# Patient Record
Sex: Male | Born: 1970 | Race: Black or African American | Hispanic: No | Marital: Married | Smoking: Never smoker
Health system: Southern US, Community
[De-identification: ages and names within clinical notes are randomized; demographics above are authoritative.]

## PROBLEM LIST (undated history)

## (undated) DIAGNOSIS — B009 Herpesviral infection, unspecified: Secondary | ICD-10-CM

## (undated) DIAGNOSIS — I10 Essential (primary) hypertension: Secondary | ICD-10-CM

## (undated) DIAGNOSIS — J349 Unspecified disorder of nose and nasal sinuses: Secondary | ICD-10-CM

---

## 1999-01-17 ENCOUNTER — Encounter: Payer: Self-pay | Admitting: Internal Medicine

## 1999-01-17 ENCOUNTER — Ambulatory Visit (HOSPITAL_COMMUNITY): Admission: RE | Admit: 1999-01-17 | Discharge: 1999-01-17 | Payer: Self-pay | Admitting: Internal Medicine

## 2001-04-24 ENCOUNTER — Encounter: Payer: Self-pay | Admitting: Family Medicine

## 2001-04-24 ENCOUNTER — Ambulatory Visit (HOSPITAL_COMMUNITY): Admission: RE | Admit: 2001-04-24 | Discharge: 2001-04-24 | Payer: Self-pay | Admitting: Family Medicine

## 2001-10-25 ENCOUNTER — Emergency Department (HOSPITAL_COMMUNITY): Admission: EM | Admit: 2001-10-25 | Discharge: 2001-10-25 | Payer: Self-pay | Admitting: Emergency Medicine

## 2003-11-24 ENCOUNTER — Ambulatory Visit: Payer: Self-pay | Admitting: *Deleted

## 2003-11-25 ENCOUNTER — Ambulatory Visit: Payer: Self-pay | Admitting: Nurse Practitioner

## 2004-02-02 ENCOUNTER — Ambulatory Visit: Payer: Self-pay | Admitting: Nurse Practitioner

## 2004-02-21 ENCOUNTER — Ambulatory Visit: Payer: Self-pay | Admitting: Nurse Practitioner

## 2004-03-27 ENCOUNTER — Ambulatory Visit: Payer: Self-pay | Admitting: Nurse Practitioner

## 2004-04-10 ENCOUNTER — Ambulatory Visit: Payer: Self-pay | Admitting: *Deleted

## 2004-04-10 ENCOUNTER — Ambulatory Visit: Payer: Self-pay | Admitting: Nurse Practitioner

## 2004-05-03 ENCOUNTER — Ambulatory Visit: Payer: Self-pay | Admitting: Nurse Practitioner

## 2004-06-19 ENCOUNTER — Ambulatory Visit: Payer: Self-pay | Admitting: Nurse Practitioner

## 2004-08-28 ENCOUNTER — Ambulatory Visit: Payer: Self-pay | Admitting: Nurse Practitioner

## 2005-10-16 ENCOUNTER — Emergency Department (HOSPITAL_COMMUNITY): Admission: EM | Admit: 2005-10-16 | Discharge: 2005-10-16 | Payer: Self-pay | Admitting: Emergency Medicine

## 2006-04-29 ENCOUNTER — Emergency Department (HOSPITAL_COMMUNITY): Admission: EM | Admit: 2006-04-29 | Discharge: 2006-04-29 | Payer: Self-pay | Admitting: Family Medicine

## 2006-08-14 ENCOUNTER — Ambulatory Visit: Payer: Self-pay | Admitting: Internal Medicine

## 2006-09-25 ENCOUNTER — Ambulatory Visit: Payer: Self-pay | Admitting: Internal Medicine

## 2006-09-26 ENCOUNTER — Encounter (INDEPENDENT_AMBULATORY_CARE_PROVIDER_SITE_OTHER): Payer: Self-pay | Admitting: Nurse Practitioner

## 2006-12-04 ENCOUNTER — Encounter (INDEPENDENT_AMBULATORY_CARE_PROVIDER_SITE_OTHER): Payer: Self-pay | Admitting: *Deleted

## 2006-12-09 ENCOUNTER — Ambulatory Visit: Payer: Self-pay | Admitting: Internal Medicine

## 2007-01-21 ENCOUNTER — Encounter (INDEPENDENT_AMBULATORY_CARE_PROVIDER_SITE_OTHER): Payer: Self-pay | Admitting: Nurse Practitioner

## 2007-01-21 DIAGNOSIS — K2970 Gastritis, unspecified, without bleeding: Secondary | ICD-10-CM | POA: Insufficient documentation

## 2007-01-21 DIAGNOSIS — I10 Essential (primary) hypertension: Secondary | ICD-10-CM | POA: Insufficient documentation

## 2007-01-21 DIAGNOSIS — M129 Arthropathy, unspecified: Secondary | ICD-10-CM | POA: Insufficient documentation

## 2007-01-21 DIAGNOSIS — K2990 Gastroduodenitis, unspecified, without bleeding: Secondary | ICD-10-CM

## 2007-04-02 ENCOUNTER — Encounter (INDEPENDENT_AMBULATORY_CARE_PROVIDER_SITE_OTHER): Payer: Self-pay | Admitting: Nurse Practitioner

## 2007-04-02 ENCOUNTER — Ambulatory Visit: Payer: Self-pay | Admitting: Family Medicine

## 2007-04-02 LAB — CONVERTED CEMR LAB
Albumin: 4 g/dL (ref 3.5–5.2)
BUN: 11 mg/dL (ref 6–23)
Calcium: 9 mg/dL (ref 8.4–10.5)
Chloride: 107 meq/L (ref 96–112)
Cholesterol: 182 mg/dL (ref 0–200)
Glucose, Bld: 98 mg/dL (ref 70–99)
HDL: 46 mg/dL (ref >39)
Hemoglobin: 14.5 g/dL (ref 13.0–17.0)
Lymphs Abs: 1.6 10*3/uL (ref 0.7–4.0)
MCV: 88.5 fL (ref 78.0–100.0)
Monocytes Absolute: 0.4 10*3/uL (ref 0.1–1.0)
Monocytes Relative: 11 % (ref 3–12)
Neutro Abs: 1.4 10*3/uL — ABNORMAL LOW (ref 1.7–7.7)
Neutrophils Relative %: 40 % — ABNORMAL LOW (ref 43–77)
Potassium: 3.7 meq/L (ref 3.5–5.3)
RBC: 4.96 M/uL (ref 4.22–5.81)
Triglycerides: 118 mg/dL (ref <150)
WBC: 3.5 10*3/uL — ABNORMAL LOW (ref 4.0–10.5)

## 2007-05-16 ENCOUNTER — Ambulatory Visit: Payer: Self-pay | Admitting: Internal Medicine

## 2007-06-04 ENCOUNTER — Emergency Department (HOSPITAL_COMMUNITY): Admission: EM | Admit: 2007-06-04 | Discharge: 2007-06-04 | Payer: Self-pay | Admitting: Emergency Medicine

## 2007-06-10 ENCOUNTER — Ambulatory Visit: Payer: Self-pay | Admitting: Internal Medicine

## 2007-08-06 ENCOUNTER — Encounter (INDEPENDENT_AMBULATORY_CARE_PROVIDER_SITE_OTHER): Payer: Self-pay | Admitting: Nurse Practitioner

## 2007-08-06 ENCOUNTER — Ambulatory Visit: Payer: Self-pay | Admitting: Internal Medicine

## 2007-08-06 LAB — CONVERTED CEMR LAB
Basophils Relative: 1 % (ref 0–1)
Eosinophils Absolute: 0.1 10*3/uL (ref 0.0–0.7)
HCT: 45.8 % (ref 39.0–52.0)
Hemoglobin: 15.2 g/dL (ref 13.0–17.0)
MCHC: 33.2 g/dL (ref 30.0–36.0)
MCV: 88.6 fL (ref 78.0–100.0)
Monocytes Absolute: 0.5 10*3/uL (ref 0.1–1.0)
Monocytes Relative: 13 % — ABNORMAL HIGH (ref 3–12)
Neutro Abs: 1.7 10*3/uL (ref 1.7–7.7)
RBC: 5.17 M/uL (ref 4.22–5.81)

## 2007-12-01 ENCOUNTER — Encounter (INDEPENDENT_AMBULATORY_CARE_PROVIDER_SITE_OTHER): Payer: Self-pay | Admitting: Family Medicine

## 2007-12-01 ENCOUNTER — Ambulatory Visit: Payer: Self-pay | Admitting: Internal Medicine

## 2007-12-01 LAB — CONVERTED CEMR LAB
Albumin: 4.6 g/dL (ref 3.5–5.2)
Alkaline Phosphatase: 74 units/L (ref 39–117)
BUN: 13 mg/dL (ref 6–23)
Glucose, Bld: 117 mg/dL — ABNORMAL HIGH (ref 70–99)
Microalb, Ur: 0.76 mg/dL (ref 0.00–1.89)
Potassium: 3.6 meq/L (ref 3.5–5.3)

## 2007-12-09 ENCOUNTER — Ambulatory Visit: Payer: Self-pay | Admitting: Internal Medicine

## 2008-01-13 ENCOUNTER — Ambulatory Visit: Payer: Self-pay | Admitting: Internal Medicine

## 2008-05-12 ENCOUNTER — Encounter (INDEPENDENT_AMBULATORY_CARE_PROVIDER_SITE_OTHER): Payer: Self-pay | Admitting: Internal Medicine

## 2008-05-12 ENCOUNTER — Ambulatory Visit: Payer: Self-pay | Admitting: Internal Medicine

## 2008-05-12 LAB — CONVERTED CEMR LAB
Eosinophils Relative: 2 % (ref 0–5)
HCT: 42.2 % (ref 39.0–52.0)
Lymphocytes Relative: 51 % — ABNORMAL HIGH (ref 12–46)
Lymphs Abs: 1.8 10*3/uL (ref 0.7–4.0)
Neutro Abs: 1.3 10*3/uL — ABNORMAL LOW (ref 1.7–7.7)
Neutrophils Relative %: 36 % — ABNORMAL LOW (ref 43–77)
Platelets: 207 10*3/uL (ref 150–400)
WBC: 3.5 10*3/uL — ABNORMAL LOW (ref 4.0–10.5)

## 2008-05-20 ENCOUNTER — Ambulatory Visit: Payer: Self-pay | Admitting: Family Medicine

## 2008-05-20 ENCOUNTER — Inpatient Hospital Stay (HOSPITAL_COMMUNITY): Admission: EM | Admit: 2008-05-20 | Discharge: 2008-05-21 | Payer: Self-pay | Admitting: Emergency Medicine

## 2008-05-31 ENCOUNTER — Ambulatory Visit: Payer: Self-pay | Admitting: Internal Medicine

## 2008-06-02 ENCOUNTER — Ambulatory Visit: Payer: Self-pay | Admitting: Internal Medicine

## 2008-06-17 ENCOUNTER — Encounter: Payer: Self-pay | Admitting: Internal Medicine

## 2008-06-17 ENCOUNTER — Encounter (INDEPENDENT_AMBULATORY_CARE_PROVIDER_SITE_OTHER): Payer: Self-pay | Admitting: Internal Medicine

## 2008-06-17 ENCOUNTER — Ambulatory Visit: Payer: Self-pay | Admitting: Internal Medicine

## 2008-06-17 LAB — CONVERTED CEMR LAB: Helicobacter Pylori Antibody-IgG: 1.3 — ABNORMAL HIGH

## 2008-06-22 LAB — CBC WITH DIFFERENTIAL/PLATELET
BASO%: 0.5 % (ref 0.0–2.0)
Basophils Absolute: 0 10*3/uL (ref 0.0–0.1)
EOS%: 3.5 % (ref 0.0–7.0)
Eosinophils Absolute: 0.1 10*3/uL (ref 0.0–0.5)
HCT: 43.9 % (ref 38.4–49.9)
HGB: 15 g/dL (ref 13.0–17.1)
LYMPH%: 38.6 % (ref 14.0–49.0)
MCH: 30.1 pg (ref 27.2–33.4)
MCHC: 34.1 g/dL (ref 32.0–36.0)
MCV: 88.1 fL (ref 79.3–98.0)
MONO#: 0.6 10*3/uL (ref 0.1–0.9)
MONO%: 15.3 % — ABNORMAL HIGH (ref 0.0–14.0)
NEUT#: 1.6 10*3/uL (ref 1.5–6.5)
NEUT%: 42.1 % (ref 39.0–75.0)
Platelets: 194 10*3/uL (ref 140–400)
RBC: 4.98 10*6/uL (ref 4.20–5.82)
RDW: 12.7 % (ref 11.0–14.6)
WBC: 3.8 10*3/uL — ABNORMAL LOW (ref 4.0–10.3)
lymph#: 1.5 10*3/uL (ref 0.9–3.3)

## 2008-06-22 LAB — COMPREHENSIVE METABOLIC PANEL
ALT: 24 U/L (ref 0–53)
BUN: 14 mg/dL (ref 6–23)
CO2: 31 mEq/L (ref 19–32)
Calcium: 9.4 mg/dL (ref 8.4–10.5)
Chloride: 102 mEq/L (ref 96–112)
Creatinine, Ser: 1.08 mg/dL (ref 0.40–1.50)
Total Bilirubin: 1 mg/dL (ref 0.3–1.2)

## 2008-06-22 LAB — LACTATE DEHYDROGENASE: LDH: 150 U/L (ref 94–250)

## 2008-06-26 ENCOUNTER — Emergency Department (HOSPITAL_COMMUNITY): Admission: EM | Admit: 2008-06-26 | Discharge: 2008-06-26 | Payer: Self-pay | Admitting: Emergency Medicine

## 2008-07-12 ENCOUNTER — Ambulatory Visit: Payer: Self-pay | Admitting: Internal Medicine

## 2008-08-05 ENCOUNTER — Encounter: Payer: Self-pay | Admitting: Internal Medicine

## 2008-08-05 ENCOUNTER — Ambulatory Visit: Payer: Self-pay | Admitting: Family Medicine

## 2008-08-05 DIAGNOSIS — L270 Generalized skin eruption due to drugs and medicaments taken internally: Secondary | ICD-10-CM | POA: Insufficient documentation

## 2008-10-19 ENCOUNTER — Ambulatory Visit: Payer: Self-pay | Admitting: Internal Medicine

## 2008-10-19 ENCOUNTER — Emergency Department (HOSPITAL_COMMUNITY): Admission: EM | Admit: 2008-10-19 | Discharge: 2008-10-19 | Payer: Self-pay | Admitting: Emergency Medicine

## 2008-10-19 ENCOUNTER — Telehealth (INDEPENDENT_AMBULATORY_CARE_PROVIDER_SITE_OTHER): Payer: Self-pay | Admitting: *Deleted

## 2008-11-17 ENCOUNTER — Ambulatory Visit: Payer: Self-pay | Admitting: Internal Medicine

## 2008-11-17 ENCOUNTER — Encounter (INDEPENDENT_AMBULATORY_CARE_PROVIDER_SITE_OTHER): Payer: Self-pay | Admitting: Internal Medicine

## 2008-11-17 LAB — CONVERTED CEMR LAB
CO2: 28 meq/L (ref 19–32)
Calcium: 9.4 mg/dL (ref 8.4–10.5)
Chlamydia, Swab/Urine, PCR: NEGATIVE
Chloride: 101 meq/L (ref 96–112)
GC Probe Amp, Urine: NEGATIVE
Lymphs Abs: 2.4 10*3/uL (ref 0.7–4.0)
Monocytes Relative: 15 % — ABNORMAL HIGH (ref 3–12)
Neutro Abs: 1.5 10*3/uL — ABNORMAL LOW (ref 1.7–7.7)
Neutrophils Relative %: 32 % — ABNORMAL LOW (ref 43–77)
RBC: 4.95 M/uL (ref 4.22–5.81)
Sodium: 139 meq/L (ref 135–145)
WBC: 4.8 10*3/uL (ref 4.0–10.5)

## 2008-11-18 ENCOUNTER — Encounter (INDEPENDENT_AMBULATORY_CARE_PROVIDER_SITE_OTHER): Payer: Self-pay | Admitting: Internal Medicine

## 2008-11-25 ENCOUNTER — Ambulatory Visit (HOSPITAL_COMMUNITY): Admission: RE | Admit: 2008-11-25 | Discharge: 2008-11-25 | Payer: Self-pay | Admitting: Internal Medicine

## 2008-11-25 ENCOUNTER — Ambulatory Visit: Payer: Self-pay | Admitting: Internal Medicine

## 2008-11-25 ENCOUNTER — Encounter (INDEPENDENT_AMBULATORY_CARE_PROVIDER_SITE_OTHER): Payer: Self-pay | Admitting: Internal Medicine

## 2008-11-25 LAB — CONVERTED CEMR LAB
Cholesterol: 183 mg/dL
HDL: 47 mg/dL
LDL Cholesterol: 103 mg/dL — ABNORMAL HIGH
TSH: 1.17 u[IU]/mL
Total CHOL/HDL Ratio: 3.9
Triglycerides: 164 mg/dL — ABNORMAL HIGH
VLDL: 33 mg/dL

## 2008-11-26 ENCOUNTER — Telehealth (INDEPENDENT_AMBULATORY_CARE_PROVIDER_SITE_OTHER): Payer: Self-pay | Admitting: *Deleted

## 2008-11-26 ENCOUNTER — Emergency Department (HOSPITAL_COMMUNITY): Admission: EM | Admit: 2008-11-26 | Discharge: 2008-11-26 | Payer: Self-pay | Admitting: Emergency Medicine

## 2009-01-31 ENCOUNTER — Encounter (INDEPENDENT_AMBULATORY_CARE_PROVIDER_SITE_OTHER): Payer: Self-pay | Admitting: Internal Medicine

## 2009-01-31 ENCOUNTER — Ambulatory Visit: Payer: Self-pay | Admitting: Family Medicine

## 2009-02-21 ENCOUNTER — Ambulatory Visit: Payer: Self-pay | Admitting: Internal Medicine

## 2009-04-18 ENCOUNTER — Ambulatory Visit: Payer: Self-pay | Admitting: Internal Medicine

## 2009-05-31 ENCOUNTER — Ambulatory Visit: Payer: Self-pay | Admitting: Internal Medicine

## 2009-06-06 ENCOUNTER — Ambulatory Visit: Payer: Self-pay | Admitting: Internal Medicine

## 2009-06-06 LAB — CONVERTED CEMR LAB
BUN: 15 mg/dL (ref 6–23)
Chloride: 105 meq/L (ref 96–112)
Creatinine, Ser: 1.03 mg/dL (ref 0.40–1.50)
Glucose, Bld: 98 mg/dL (ref 70–99)

## 2009-07-21 ENCOUNTER — Ambulatory Visit: Payer: Self-pay | Admitting: Internal Medicine

## 2009-11-03 ENCOUNTER — Ambulatory Visit: Payer: Self-pay | Admitting: Internal Medicine

## 2009-11-03 LAB — CONVERTED CEMR LAB: Chlamydia, Swab/Urine, PCR: NEGATIVE

## 2010-02-14 ENCOUNTER — Emergency Department (HOSPITAL_COMMUNITY)
Admission: EM | Admit: 2010-02-14 | Discharge: 2010-02-14 | Payer: Self-pay | Source: Home / Self Care | Admitting: Family Medicine

## 2010-02-14 ENCOUNTER — Telehealth: Payer: Self-pay | Admitting: Internal Medicine

## 2010-02-15 ENCOUNTER — Emergency Department (HOSPITAL_COMMUNITY)
Admission: EM | Admit: 2010-02-15 | Discharge: 2010-02-15 | Payer: Self-pay | Source: Home / Self Care | Admitting: Emergency Medicine

## 2010-02-15 ENCOUNTER — Telehealth (INDEPENDENT_AMBULATORY_CARE_PROVIDER_SITE_OTHER): Payer: Self-pay | Admitting: *Deleted

## 2010-02-16 ENCOUNTER — Ambulatory Visit: Payer: Self-pay | Admitting: Internal Medicine

## 2010-02-16 ENCOUNTER — Telehealth: Payer: Self-pay | Admitting: Internal Medicine

## 2010-02-16 DIAGNOSIS — R079 Chest pain, unspecified: Secondary | ICD-10-CM

## 2010-02-27 ENCOUNTER — Telehealth: Payer: Self-pay | Admitting: Nurse Practitioner

## 2010-02-28 ENCOUNTER — Ambulatory Visit: Payer: Self-pay | Admitting: Gastroenterology

## 2010-02-28 ENCOUNTER — Telehealth: Payer: Self-pay | Admitting: Physician Assistant

## 2010-04-19 NOTE — Progress Notes (Signed)
Summary: Triage  Phone Note Call from Patient Call back at Home Phone [phone]   Caller: Patient Call For: Dr. Juanda Chance Reason for Call: Talk to Nurse Summary of Call: Pt has been to urgent care for the "burning in his throat" and was told to follow up with a GI to further evaluate his problem Initial call taken by: [place] Johnson,  February 16, 2010 10:14 AM  Follow-up for Phone Call        Message left for patient to call back. Jesse Fall, RN 02/16/10 at 10:41 AM Patient went to ER yesterday. They gave him instructions to call for an appointment. He still has a sore throat but it is a little better. scheduled patient with Willette Cluster, RNP toady at 3:30 PM. Follow-up by: Jesse Fall RN,  February 16, 2010 11:35 AM

## 2010-04-19 NOTE — Progress Notes (Signed)
  Phone Note Other Incoming   Caller: Lori with [facility] of Call: Received a call from [place] with [phone]) . She was inquiring if the patient was treated at the ER or seen by our MD. Spoke with Mike Gip, PA and she verified that patient did not show at the ER yesterday. No note in E chart that patient ever went  to the ER. Information given to [place]. She states she will follow up with Urgent Care. Initial call taken by: Jesse Fall RN,  February 15, 2010 1:07 PM

## 2010-04-19 NOTE — Assessment & Plan Note (Signed)
Summary: dysphagia due to drinking caustic drink/[place]   History of Present Illness Visit Type: Initial Visit Primary GI MD: Lina Sar MD Primary Provider: Dala Dock, Dr. Clayborn Bigness Chief Complaint: dyaphagia, due to drinking caustic drink History of Present Illness:   Patient is a 40 year old male from [place] who presents for evaluation after accidentlally drinking Potpourri 5 days ago. Patient is [place], bought the potpourri thinking it was cherry juice. Only drank 1/2 cup because it tasted bad. No nausea but diarrhea the following day. He was evaluated in ER, given Zantac and told to just follow up with GI. Patient has mild burning in his chest but no odynophagia or dysphagia. He takes daily Pepcid for history of GERD.   GI Review of Systems    Reports chest pain.      Denies abdominal pain, acid reflux, belching, bloating, dysphagia with liquids, dysphagia with solids, heartburn, loss of appetite, nausea, vomiting, vomiting blood, weight loss, and  weight gain.      Reports diarrhea.     Denies anal fissure, black tarry stools, change in bowel habit, constipation, diverticulosis, fecal incontinence, heme positive stool, hemorrhoids, irritable bowel syndrome, jaundice, light color stool, liver problems, rectal bleeding, and  rectal pain. Preventive Screening-Counseling & Management  Alcohol-Tobacco     Smoking Status: never    Current Medications (verified): 1)  Claritin 10 Mg Tabs (Loratadine) .... Take 1 Tablet By Mouth Once A Day 2)  Famotidine 20 Mg Tabs (Famotidine) .... Take 1 Tablet By Mouth Two Times A Day 3)  Ranitidine Hcl 150 Mg Caps (Ranitidine Hcl) .... Once Daily 4)  Cyclobenzaprine Hcl 10 Mg Tabs (Cyclobenzaprine Hcl) .... Take 1/2 Tablet By Mouth Three Times A Day  As Needed 5)  Sulindac 200 Mg Tabs (Sulindac) .... Two Times A Day  Allergies: 1)  ! Aciphex 2)  ! Prilosec  Past History:  Past Surgical History:  Unremarkable  Family History: No FH  of Colon Cancer:  Social History: Occupation:  Patient has never smoked.  Alcohol Use - no Smoking Status:  never  Review of Systems       The patient complains of arthritis/joint pain, headaches-new, and sore throat.  The patient denies allergy/sinus, anemia, anxiety-new, back pain, blood in urine, breast changes/lumps, change in vision, confusion, cough, coughing up blood, depression-new, fainting, fatigue, fever, hearing problems, heart murmur, heart rhythm changes, itching, menstrual pain, muscle pains/cramps, night sweats, nosebleeds, pregnancy symptoms, shortness of breath, skin rash, sleeping problems, swelling of feet/legs, swollen lymph glands, thirst - excessive , urination - excessive , urination changes/pain, urine leakage, vision changes, and voice change.    Vital Signs:  Patient profile:   40 year old male Height:      69 inches Weight:      196.38 pounds BMI:     29.11 Pulse rate:   80 / minute Pulse rhythm:   regular BP sitting:   140 / 70  (left arm) Cuff size:   regular  Vitals Entered By: June McMurray CMA Duncan Dull) (February 16, 2010 3:43 PM)  Physical Exam  General:  Well developed, well nourished, no acute distress. Head:  Normocephalic and atraumatic. Eyes:  Conjunctiva pink, no icterus.  Mouth:  No oral lesions. Tongue moist.  Neck:  no obvious masses  Lungs:  Clear throughout to auscultation. Heart:  Regular rate and rhythm; no murmurs, rubs,  or bruits. Abdomen:  Abdomen soft, nontender, nondistended. No obvious masses or hepatomegaly.Normal bowel sounds.  Msk:  Symmetrical  with no gross deformities. Normal posture. Extremities:  No palmar erythema, no edema.  Neurologic:  Alert and  oriented x4;  grossly normal neurologically. Skin:  Intact without significant lesions or rashes. Cervical Nodes:  No significant cervical adenopathy. Psych:  Alert and cooperative. Normal mood and affect.   Impression & Recommendations:  Problem # 1:  CHEST PAIN  (ICD-786.50) Chest burning after accidentially drinking 1/2 cup of potpourri 5 days ago. His only symptom is that of mild burning in chest and that is improving on Zantac. At this point there isn't a need for upper endoscopy. Will change him from Zantac to a PPI for 30 days. Patient knows to call us should the burning not resolve or if he develops odynophagia or dysphagia.   Patient Instructions: 1)  Take Prilosec OTC 1 cap 30 min before breakfast and dinner. 2)  Stop the ranitidine for now. 3)  If you have problems with burning in the chest  or food getting stuck call us  at [phone]. 4)  Copy sent to : [facility], Dr. Deloris Ping 5)  The medication list was reviewed and reconciled.  All changed / newly prescribed medications were explained.  A complete medication list was provided to the patient / caregiver.

## 2010-04-19 NOTE — Progress Notes (Signed)
Summary: Triage  Phone Note From Other Clinic   Caller: Dr. Retta Diones @ Urgent Care [phone] Call For: Dr. Juanda Chance Summary of Call: Drank a toxic drink and having burning in throat...requesting pt. have a Endo ASAP...wants to know if we can sch'd directly Initial call taken by: Karna Christmas,  February 14, 2010 12:26 PM  Follow-up for Phone Call        Per Dr. Juanda Chance have patient go to [facility] and have Mike Gip, PA see patient. Dr. Ander Purpura notified and he will reach patient and have him go to the [facility] ER. He states patient drank black cherry flavored fragarence oil 3 days ago. Follow-up by: Jesse Fall RN,  February 14, 2010 1:33 PM

## 2010-04-20 NOTE — Progress Notes (Signed)
Summary: NOS reschedule  Phone Note Call from Patient Call back at Home Phone [phone]   Caller: Patient Call For: Mike Gip Reason for Call: Talk to Nurse Summary of Call: pt called at 10:25am regarding 9am appt this morning with Amy... pt overslept... told pt he will be contacted by our office to resch and may be charged a NOS fee Initial call taken by: Vallarie Mare,  February 28, 2010 10:26 AM

## 2010-04-20 NOTE — Progress Notes (Signed)
Summary: triage  Phone Note Call from Patient Call back at Home Phone [phone]   Caller: Patient Call For: Willette Cluster Reason for Call: Talk to Nurse Summary of Call: pt wants to come in and see Gunnar Fusi again for blood in stool and throat is hurting Initial call taken by: Vallarie Mare,  February 27, 2010 10:05 AM  Follow-up for Phone Call        Spoke with patient. He reports a BM  yesterday with some bright red blood on stool. He reports hx of hemorrhoids and blood in the stool 1 year ago. Denies constipation.  He has not had another BM this AM.  Patient also c/o stomach pain that radiates to his chest. He describes this as "gas pain."  He is having a lot of gas. Deines swallowing problems. He states his throat is dry. He is taking the Prilosec as prescribed. Patient was wanting to be seen by Willette Cluster, RNP. Dr. Juanda Chance, would you want to see him or have the extender see him? Or just give him general instructions for gas/hemorrhoids? Please, advise.  Follow-up by: Jesse Fall RN,  February 27, 2010 10:53 AM  Additional Follow-up for Phone Call Additional follow up Details #1::        OK to see Gunnar Fusi if she has time. Additional Follow-up by: Hart Carwin MD,  February 27, 2010 11:21 AM     Appended Document: triage Spoke with pt; scheduled with Mike Gip, PA for 02/28/10 @ 0900am.

## 2010-06-23 LAB — POCT I-STAT, CHEM 8
Calcium, Ion: 1.21 mmol/L (ref 1.12–1.32)
Chloride: 101 mEq/L (ref 96–112)
Creatinine, Ser: 1.2 mg/dL (ref 0.4–1.5)
Glucose, Bld: 91 mg/dL (ref 70–99)
HCT: 46 % (ref 39.0–52.0)

## 2010-06-23 LAB — POCT CARDIAC MARKERS
CKMB, poc: 1.8 ng/mL (ref 1.0–8.0)
Troponin i, poc: <0.05 ng/mL (ref 0.00–0.09)

## 2010-06-28 LAB — DIFFERENTIAL
Basophils Absolute: 0 10*3/uL (ref 0.0–0.1)
Basophils Relative: 1 % (ref 0–1)
Eosinophils Absolute: 0 10*3/uL (ref 0.0–0.7)
Monocytes Relative: 11 % (ref 3–12)
Neutro Abs: 2.9 10*3/uL (ref 1.7–7.7)
Neutrophils Relative %: 59 % (ref 43–77)

## 2010-06-28 LAB — CBC
HCT: 47.5 % (ref 39.0–52.0)
Hemoglobin: 16.1 g/dL (ref 13.0–17.0)
RBC: 5.35 MIL/uL (ref 4.22–5.81)
RDW: 12.7 % (ref 11.5–15.5)
WBC: 4.9 10*3/uL (ref 4.0–10.5)

## 2010-06-28 LAB — COMPREHENSIVE METABOLIC PANEL
ALT: 29 U/L (ref 0–53)
Alkaline Phosphatase: 75 U/L (ref 39–117)
BUN: 7 mg/dL (ref 6–23)
CO2: 28 mEq/L (ref 19–32)
Chloride: 98 mEq/L (ref 96–112)
GFR calc non Af Amer: >60 mL/min (ref >60)
Glucose, Bld: 99 mg/dL (ref 70–99)
Potassium: 3.7 mEq/L (ref 3.5–5.1)
Sodium: 139 mEq/L (ref 135–145)
Total Bilirubin: 1.8 mg/dL — ABNORMAL HIGH (ref 0.3–1.2)

## 2010-06-29 LAB — COMPREHENSIVE METABOLIC PANEL
ALT: 26 U/L (ref 0–53)
AST: 37 U/L (ref 0–37)
Albumin: 3.8 g/dL (ref 3.5–5.2)
CO2: 30 mEq/L (ref 19–32)
Calcium: 9.2 mg/dL (ref 8.4–10.5)
Chloride: 99 mEq/L (ref 96–112)
Creatinine, Ser: 1.06 mg/dL (ref 0.4–1.5)
GFR calc Af Amer: >60 mL/min (ref >60)
Sodium: 138 mEq/L (ref 135–145)

## 2010-06-29 LAB — URINALYSIS, ROUTINE W REFLEX MICROSCOPIC
Glucose, UA: NEGATIVE mg/dL
Hgb urine dipstick: NEGATIVE
Specific Gravity, Urine: 1.026 (ref 1.005–1.030)
pH: 7 (ref 5.0–8.0)

## 2010-06-29 LAB — POCT CARDIAC MARKERS
CKMB, poc: 2 ng/mL (ref 1.0–8.0)
Myoglobin, poc: 227 ng/mL (ref 12–200)
Troponin i, poc: <0.05 ng/mL (ref 0.00–0.09)

## 2010-06-29 LAB — DIFFERENTIAL
Eosinophils Absolute: 0.1 10*3/uL (ref 0.0–0.7)
Eosinophils Relative: 2 % (ref 0–5)
Lymphocytes Relative: 30 % (ref 12–46)
Lymphs Abs: 1.1 10*3/uL (ref 0.7–4.0)
Monocytes Absolute: 0.5 10*3/uL (ref 0.1–1.0)

## 2010-06-29 LAB — BASIC METABOLIC PANEL
BUN: 10 mg/dL (ref 6–23)
BUN: 8 mg/dL (ref 6–23)
CO2: 28 mEq/L (ref 19–32)
CO2: 30 mEq/L (ref 19–32)
Calcium: 9.2 mg/dL (ref 8.4–10.5)
Chloride: 105 mEq/L (ref 96–112)
GFR calc non Af Amer: >60 mL/min (ref >60)
GFR calc non Af Amer: >60 mL/min (ref >60)
Glucose, Bld: 101 mg/dL — ABNORMAL HIGH (ref 70–99)
Glucose, Bld: 99 mg/dL (ref 70–99)
Potassium: 3.6 mEq/L (ref 3.5–5.1)

## 2010-06-29 LAB — CBC
HCT: 40.7 % (ref 39.0–52.0)
Hemoglobin: 14.1 g/dL (ref 13.0–17.0)
MCHC: 34.4 g/dL (ref 30.0–36.0)
MCHC: 34.7 g/dL (ref 30.0–36.0)
MCV: 89 fL (ref 78.0–100.0)
Platelets: 183 10*3/uL (ref 150–400)
RBC: 4.64 MIL/uL (ref 4.22–5.81)
RBC: 5 MIL/uL (ref 4.22–5.81)
RDW: 12.5 % (ref 11.5–15.5)
WBC: 3.8 10*3/uL — ABNORMAL LOW (ref 4.0–10.5)

## 2010-06-29 LAB — TROPONIN I: Troponin I: <0.01 ng/mL (ref 0.00–0.06)

## 2010-06-29 LAB — LIPID PANEL
Cholesterol: 172 mg/dL (ref 0–200)
HDL: 42 mg/dL (ref >39)
Total CHOL/HDL Ratio: 4.1 RATIO
Triglycerides: 104 mg/dL (ref <150)

## 2010-06-29 LAB — PROTIME-INR: Prothrombin Time: 15.1 seconds (ref 11.6–15.2)

## 2010-06-29 LAB — H. PYLORI ANTIBODY, IGG: H Pylori IgG: 1.4 {ISR} — ABNORMAL HIGH

## 2010-06-29 LAB — HEPARIN LEVEL (UNFRACTIONATED): Heparin Unfractionated: 0.17 IU/mL — ABNORMAL LOW (ref 0.30–0.70)

## 2010-06-29 LAB — CK TOTAL AND CKMB (NOT AT ARMC): CK, MB: 3.6 ng/mL (ref 0.3–4.0)

## 2010-06-29 LAB — CARDIAC PANEL(CRET KIN+CKTOT+MB+TROPI)
CK, MB: 2.9 ng/mL (ref 0.3–4.0)
Total CK: 830 U/L — ABNORMAL HIGH (ref 7–232)
Troponin I: <0.01 ng/mL (ref 0.00–0.06)

## 2010-08-01 NOTE — H&P (Signed)
Kevin Little, Kevin Little NO.:  [id number]   MEDICAL RECORD NO.:  [id number]          PATIENT TYPE:  INP   LOCATION:  2023                         FACILITY:  [facility]   PHYSICIAN:  Ancil Boozer, MD      DATE OF BIRTH:  1970-06-15   DATE OF ADMISSION:  05/20/2008  DATE OF DISCHARGE:                              HISTORY & PHYSICAL   REASON FOR ADMISSION:  Chest pain.   PRIMARY CARE PHYSICIAN:  [facility], thus he is unassigned to [place].   HISTORY OF PRESENT ILLNESS:  This is a 40 year old African American male  with epigastric burning pain that he rates as a 4-5/10 since Monday,  May 17, 2008, that is different from his typical reflux pain.  This  morning when he woke up to go to the restroom, it moved into his chest  and when he started walking to the bathroom, he became short of breath,  lightheaded, and diaphoretic.  He subsequently sat down and the pain  improved, but he still had some residual pain, thus he came to the  emergency room.  In the emergency room, he was given a GI cocktail which  did not help his pain and subsequently he was given nitroglycerin x3  which did help, but somewhat.   PAST MEDICAL HISTORY:  1. Hypertension.  2. GERD with questionable history of H. pylori that has been treated.   PAST SURGICAL HISTORY:  None.   CURRENT MEDICATIONS:  1. Hydrochlorothiazide 25 mg daily.  2. Medication to help his urine stream that is unknown to him.   ALLERGIES:  No known drug allergies.   REVIEW OF SYSTEMS:  A weak urinary stream, otherwise negative on greater  than 12 points.   FAMILY HISTORY:  Mother is deceased.  She has hypertension and died of a  CVA in her 12s.  Father is living at 28 with prostate problems.  He has  4 brothers, all with hypertension, none with heart attacks; and 4  sisters, 2 with hypertension and none with heart attacks.   SOCIAL HISTORY:  He is originally from [place].  He has been in  [place] since 1997.  His  primary language is [place].  He lives  with his wife and a 37-month-old son.  He denies any tobacco or illicit  drugs, and does drink very rarely social alcohol.   PHYSICAL EXAMINATION:  VITAL SIGNS:  Temperature 97.0, respiratory 18-  26, pulse 68-74, 98% on room air, blood pressure 121-160/60-147.  GENERAL:  This is a pleasant African American male in no acute distress.  HEENT:  Sclerae are clear.  Normocephalic and atraumatic.  Pupils are  equal, round, and reactive to light.  Oropharynx pink and moist.  There  is good dentition.  CARDIOVASCULAR:  Regular rate and rhythm.  No murmurs, rubs, or gallops.  LUNGS:  Clear to auscultation bilaterally with normal work of breathing.  ABDOMEN:  Soft, nontender, nondistended.  Positive bowel sounds.  No  hepatosplenomegaly appreciated.  EXTREMITIES:  No edema and 2+ peripheral pulses.  NEUROLOGIC:  The patient is alert and oriented x3 with grossly  intact  neurologic exam.   LABORATORY DATA:  White blood cell count 3.8, hemoglobin 15.3,  hematocrit 44.5, and platelet count 183 with a normal differential.  Sodium was 138, potassium 3.3, chloride 99, bicarb 30, BUN 10,  creatinine 1.06, glucose 109, total bilirubin 1.4, alkaline phosphatase  66, AST 37, ALT 26, total protein 7.1, albumin 3.8, calcium 9.2.  Point-  of-care cardiac enzymes revealed CK-MB 2, troponin I less than 0.05,  myoglobin 227, lipase at 20, but D-dimer is less than 0.22.  Urinalysis  reveals specific gravity of 1.026, pH of 7, and otherwise is within  normal limits.  Fecal occult blood is negative.  Four set of cardiac  enzymes revealed CK 895 and MB of 3.6.   STUDIES:  Chest x-ray is unchanged, has an unchanged possible small  calcified hilar lymph node on the left.  In a lateral view a confluent  opacity in the right middle lobe or lingula which is a bronchopneumonia  or early pneumonia not excluded.  EKG reveals normal sinus rhythm with  flipped Ts in leads II, III, V4  through V6.  An RSR prime in V2 which is  new from March 2009.   ASSESSMENT:  This 40 year old male with chest pain and history of  hypertension as well as reflux.   PLAN:  1. Chest pain.  The story is worrisome for possible cardiac cause.      Therefore, we will admit the patient and check serial cardiac      enzymes as well as repeat EKG in the morning.  We will risk      stratify by checking A1c and lipid panel.  We will monitor closely      on telemetry and actually start a heparin drip while he is ruling      out given his story.  We will continue aspirin as started in the      emergency room, but avoiding a beta-blocker at this point because      his pulses are already in the low 60s most of the time.  We will      consult Cardiology and for possible stress testing while he is in-      house.  Given that he does have GERD as a history, we will continue      Protonix while in house and check an H. pylori stool antigen to see      if he is still actively infected and this could be the cause of his      symptoms.  2. Hypertension.  We will continue his hydrochlorothiazide per home      dose and monitor closely.  3. FEN/GI, we will replete his potassium and put him on a low-sodium,      heart healthy diet.  There are no indications for IV fluids at this      time.  4. Disposition is pending rule out and cardiac recommendations      Ancil Boozer, MD  Electronically Signed     SA/MEDQ  D:  05/21/2008  T:  05/22/2008  Job:  [id number]

## 2010-08-01 NOTE — Discharge Summary (Signed)
NAMEEDEN, RHO NO.:  [id number]   MEDICAL RECORD NO.:  [id number]          PATIENT TYPE:  INP   LOCATION:  2023                         FACILITY:  [facility]   PHYSICIAN:  Paula Compton, MD        DATE OF BIRTH:  09-Feb-1971   DATE OF ADMISSION:  05/20/2008  DATE OF DISCHARGE:  05/21/2008                               DISCHARGE SUMMARY   ADMISSION DIAGNOSES:  Chest pain, gastroesophageal reflux disease,  hypertension.   DISCHARGE DIAGNOSES:  Noncardiac chest pain, gastroesophageal reflux  disease, hypertension.   PROCEDURES PERFORMED:  The patient had a chest x-ray on the day of  admission that showed possible bronchopneumonia.  The patient also had a  stress echocardiogram with results of which are pending at the time of  this discharge summary and can be viewed in E-chart.   CONSULTATIONS:  During admission, Cardiology.   HISTORY:  Briefly, this is a 40 year old African American male with  epigastric burning pain that he rated at 4-5/10 since May 17, 2008,  that was different from typical reflux type pain.  The morning of  admission, the patient woke up to go to the restroom and on moving, the  pain moved into his chest.  The patient became short of breath,  lightheaded, diaphoretic.  He then sat down and the pain improved, but  still had some residual pain, thus came to the ER.  In the ER, the  patient was given GI cocktail, which did not resolve his pain.  Subsequently, he was given nitroglycerin x3 which did help his pain.   HOSPITAL COURSE:  1. Chest pain.  By hospital day #1, the pain had resolved.  Cardiology      came to see the patient and recommended a inpatient stress      echocardiogram.  This was completed and the patient was discharged      with chest pain having resolved.  The results can be followed up in      E-chart.  2. Hypertension.  The patient was placed on his home meds of      hydrochlorothiazide, but his pressures remained  controlled      throughout the admission.  3. GERD.  The patient was placed on omeprazole 40 mg daily and did not      have any further symptoms.   CONDITION ON DISCHARGE:  Stable and improved.   DISPOSITION:  To home.   MEDICATIONS:  1. Omeprazole 40 mg p.o. daily.  2. Hydrochlorothiazide 25 mg p.o. daily  3. Aspirin 81 mg p.o. daily.   FOLLOWUP:  Followup will be with [facility].  The patient is to call  for an appointment at the next available time slot.  Issues to followup  will be a positive H. Pylori antibody test.      Rodney Langton, MD  Electronically Signed      Paula Compton, MD  Electronically Signed    TT/MEDQ  D:  05/22/2008  T:  05/23/2008  Job:  [id number]

## 2010-12-11 LAB — POCT I-STAT CREATININE
Creatinine, Ser: 1.2
Operator id: 257131

## 2010-12-11 LAB — I-STAT 8, (EC8 V) (CONVERTED LAB)
Acid-Base Excess: 7 — ABNORMAL HIGH
Bicarbonate: 32.5 — ABNORMAL HIGH
Glucose, Bld: 84
Potassium: 4.5
TCO2: 34
pH, Ven: 7.429 — ABNORMAL HIGH

## 2010-12-11 LAB — HEPATIC FUNCTION PANEL
ALT: 29
AST: 33
Alkaline Phosphatase: 71
Bilirubin, Direct: <0.1
Total Bilirubin: 0.7

## 2010-12-11 LAB — POCT CARDIAC MARKERS
Myoglobin, poc: 113
Operator id: 257131
Troponin i, poc: <0.05

## 2011-04-13 ENCOUNTER — Encounter (HOSPITAL_COMMUNITY): Payer: Self-pay

## 2011-04-13 ENCOUNTER — Emergency Department (INDEPENDENT_AMBULATORY_CARE_PROVIDER_SITE_OTHER)
Admission: EM | Admit: 2011-04-13 | Discharge: 2011-04-13 | Disposition: A | Discharge status: Home / Self Care | Payer: Self-pay | Source: Home / Self Care

## 2011-04-13 DIAGNOSIS — I10 Essential (primary) hypertension: Secondary | ICD-10-CM

## 2011-04-13 DIAGNOSIS — R51 Headache: Secondary | ICD-10-CM

## 2011-04-13 DIAGNOSIS — B009 Herpesviral infection, unspecified: Secondary | ICD-10-CM

## 2011-04-13 HISTORY — DX: Essential (primary) hypertension: I10

## 2011-04-13 LAB — POCT I-STAT, CHEM 8
Calcium, Ion: 1.24 mmol/L (ref 1.12–1.32)
Creatinine, Ser: 1.1 mg/dL (ref 0.50–1.35)
Glucose, Bld: 104 mg/dL — ABNORMAL HIGH (ref 70–99)
HCT: 50 % (ref 39.0–52.0)
Hemoglobin: 17 g/dL (ref 13.0–17.0)
TCO2: 31 mmol/L (ref 0–100)

## 2011-04-13 MED ORDER — IBUPROFEN 800 MG PO TABS: 800.0000 mg | ORAL_TABLET | Freq: Three times a day (TID) | ORAL | Status: AC

## 2011-04-13 NOTE — ED Provider Notes (Signed)
Medical screening examination/treatment/procedure(s) were performed by non-physician practitioner and as supervising physician I was immediately available for consultation/collaboration.  Luiz Blare MD   Luiz Blare, MD 04/13/11 1725

## 2011-04-13 NOTE — ED Provider Notes (Signed)
History     CSN: [id number]  Arrival date & time 04/13/11  1209   None     Chief Complaint  Patient presents with  . Headache  . Sinusitis  . Rash    (Consider location/radiation/quality/duration/timing/severity/associated sxs/prior treatment) HPI Comments: Patient states that he has had a headache for the last 3 weeks. The headache waxes and wanes in intensity but never completely goes away. It is different than any previous headache that he has had in that it has been persistent. He has tried Tylenol without relief. Nothing seems to make the headache worse. The discomfort involves his entire head. He has had no nausea, vomiting, visual changes, numbness, tingling, or head injury. He was seen by [facility]. They  change his blood pressure medication, adding hydrochlorothiazide. He states he has not noticed any improvement. Patient also states that he has a history of herpes infection. He has been on suppressive therapy for this. He recently had a the chest the and HIV test done with his primary care provider. The HIV test was negative. He has been doing some research online regarding HSV treatment and states he has brought information that he printed from the Internet regarding vaccinations and "cures". He wanted to share this with me and states that he has found information regarding cure via vaccination, and needs a provider to agree to receive shipment to receive this and to administer this to him.    Past Medical History  Diagnosis Date  . Hypertension   . Herpes simplex     History reviewed. No pertinent past surgical history.  No family history on file.  History  Substance Use Topics  . Smoking status: Never Smoker   . Smokeless tobacco: Not on file  . Alcohol Use: No      Review of Systems  Constitutional: Negative for fever, chills and fatigue.  HENT: Negative for ear pain, sore throat, rhinorrhea, sneezing, neck pain, postnasal  drip and sinus pressure.   Eyes: Negative for visual disturbance.  Respiratory: Negative for cough, shortness of breath and wheezing.   Cardiovascular: Negative for chest pain and palpitations.  Skin: Negative for rash.  Neurological: Positive for headaches. Negative for dizziness and weakness.    Allergies  Omeprazole and Rabeprazole sodium  Home Medications   Current Outpatient Rx  Name Route Sig Dispense Refill  . LISINOPRIL-HYDROCHLOROTHIAZIDE PO Oral Take by mouth.    Marland Kitchen LORATADINE PO Oral Take by mouth.    Marland Kitchen RANITIDINE HCL PO Oral Take by mouth.    . IBUPROFEN 800 MG PO TABS Oral Take 1 tablet (800 mg total) by mouth 3 (three) times daily. 15 tablet 0    BP 152/85  Pulse 86  Temp(Src) 98.3 F (36.8 C) (Oral)  Resp 16  SpO2 99%  Physical Exam  Constitutional: He appears well-developed and well-nourished. No distress.  HENT:  Head: Normocephalic and atraumatic.  Right Ear: Tympanic membrane, external ear and ear canal normal.  Left Ear: Tympanic membrane, external ear and ear canal normal.  Nose: Nose normal.  Mouth/Throat: Uvula is midline, oropharynx is clear and moist and mucous membranes are normal. No oropharyngeal exudate, posterior oropharyngeal edema or posterior oropharyngeal erythema.  Eyes: Conjunctivae, EOM and lids are normal. Pupils are equal, round, and reactive to light.  Fundoscopic exam:      The right eye shows no hemorrhage and no papilledema.       The left eye shows no hemorrhage and  no papilledema.  Neck: Neck supple.  Cardiovascular: Normal rate, regular rhythm and normal heart sounds.   Pulses:      Radial pulses are 2+ on the right side, and 2+ on the left side.  Pulmonary/Chest: Effort normal and breath sounds normal. No respiratory distress.  Lymphadenopathy:    He has no cervical adenopathy.  Neurological: He is alert. He has normal strength. No cranial nerve deficit or sensory deficit. Gait normal.  Reflex Scores:      Bicep reflexes  are 2+ on the right side and 2+ on the left side.      Patellar reflexes are 2+ on the right side and 2+ on the left side. Skin: Skin is warm and dry.  Psychiatric: He has a normal mood and affect.    ED Course  Procedures (including critical care time)  Labs Reviewed  POCT I-STAT, CHEM 8 - Abnormal; Notable for the following:    Glucose, Bld 104 (*)    All other components within normal limits  I-STAT, CHEM 8   No results found.   1. Headache   2. Hypertension   3. HSV infection       MDM  HA x 3 weeks, waxing and waning with neg exam and no neurological symptoms. Pt concerned about his HSV diagnosis and internet research of unapproved treatment options. Advised him to f/u with PCP.         Melody Comas, [place] 04/13/11 1527

## 2011-04-13 NOTE — ED Notes (Signed)
Multiple concerns:  Has had intermittent headaches for 3 weeks.  Also c/o nasal congestion especially in the morning.States he also feels like something is "walking" on his skin at times.

## 2011-04-16 ENCOUNTER — Emergency Department (HOSPITAL_COMMUNITY): Payer: Self-pay

## 2011-04-16 ENCOUNTER — Encounter (HOSPITAL_COMMUNITY): Payer: Self-pay

## 2011-04-16 ENCOUNTER — Emergency Department (HOSPITAL_COMMUNITY)
Admission: EM | Admit: 2011-04-16 | Discharge: 2011-04-16 | Disposition: A | Discharge status: Home / Self Care | Payer: Self-pay | Attending: Emergency Medicine | Admitting: Emergency Medicine

## 2011-04-16 DIAGNOSIS — J3489 Other specified disorders of nose and nasal sinuses: Secondary | ICD-10-CM | POA: Insufficient documentation

## 2011-04-16 DIAGNOSIS — R51 Headache: Secondary | ICD-10-CM | POA: Insufficient documentation

## 2011-04-16 DIAGNOSIS — Z79899 Other long term (current) drug therapy: Secondary | ICD-10-CM | POA: Insufficient documentation

## 2011-04-16 DIAGNOSIS — I10 Essential (primary) hypertension: Secondary | ICD-10-CM | POA: Insufficient documentation

## 2011-04-16 DIAGNOSIS — R0981 Nasal congestion: Secondary | ICD-10-CM

## 2011-04-16 HISTORY — DX: Unspecified disorder of nose and nasal sinuses: J34.9

## 2011-04-16 HISTORY — DX: Herpesviral infection, unspecified: B00.9

## 2011-04-16 MED ORDER — METOCLOPRAMIDE HCL 5 MG/ML IJ SOLN
10.0000 mg | Freq: Once | INTRAMUSCULAR | Status: AC
  Administered 2011-04-16: 10 mg via INTRAMUSCULAR

## 2011-04-16 MED ORDER — HYDROCODONE-ACETAMINOPHEN 5-325 MG PO TABS: 1.0000 | ORAL_TABLET | ORAL | Status: AC | PRN

## 2011-04-16 MED ORDER — METOCLOPRAMIDE HCL 5 MG/ML IJ SOLN
10.0000 mg | Freq: Once | INTRAMUSCULAR | Status: DC
  Filled 2011-04-16: qty 2

## 2011-04-16 MED ORDER — FLUTICASONE PROPIONATE 50 MCG/ACT NA SUSP: 2.0000 | Freq: Every day | NASAL | Status: DC

## 2011-04-16 MED ORDER — PSEUDOEPHEDRINE HCL ER 120 MG PO TB12: 120.0000 mg | ORAL_TABLET | Freq: Two times a day (BID) | ORAL | Status: AC

## 2011-04-16 NOTE — ED Provider Notes (Signed)
History     CSN: [id number]  Arrival date & time 04/16/11  1129   First MD Initiated Contact with Patient 04/16/11 1148      Chief Complaint  Patient presents with  . Nasal Congestion    (Consider location/radiation/quality/duration/timing/severity/associated sxs/prior treatment) HPI History provided by pt.   For the past four weeks, pt has had daily pressure at top of head that lasts for several hours and then resolves spontaneously.  No associated fever, dizziness, vision changes, N/V, extremity weakness/paresthesias or ataxia.  No recent head trauma.  No h/o migraines.  Has had several headaches in the past but this feels different.  Simultaneous onset nasal congestion.  Denies sinus pressure, sore throat, cough, sneezing and watery eyes.  No h/o seasonal allergies.  No known sick contacts.    Past Medical History  Diagnosis Date  . Herpes   . Hypertension   . Sinus problem     History reviewed. No pertinent past surgical history.  No family history on file.  History  Substance Use Topics  . Smoking status: Never Smoker   . Smokeless tobacco: Not on file  . Alcohol Use: No      Review of Systems  All other systems reviewed and are negative.    Allergies  Review of patient's allergies indicates no known allergies.  Home Medications   Current Outpatient Rx  Name Route Sig Dispense Refill  . ACYCLOVIR 200 MG PO CAPS Oral Take 200 mg by mouth daily.    . IBUPROFEN 800 MG PO TABS Oral Take 800 mg by mouth every 8 (eight) hours as needed. For pain    . LISINOPRIL-HYDROCHLOROTHIAZIDE 20-12.5 MG PO TABS Oral Take 1 tablet by mouth daily.      BP 140/88  Pulse 78  Temp(Src) 98.4 F (36.9 C) (Oral)  Resp 20  Ht 5\' 10"  (1.778 m)  Wt 194 lb (87.998 kg)  BMI 27.84 kg/m2  SpO2 99%  Physical Exam  Nursing note and vitals reviewed. Constitutional: He is oriented to person, place, and time. He appears well-developed and well-nourished. No distress.  HENT:  Head:  Normocephalic and atraumatic.       Sinuses non-tender  Eyes:       Normal appearance  Neck: Normal range of motion.       No meningeal signs  Cardiovascular: Normal rate and regular rhythm.   Pulmonary/Chest: Effort normal and breath sounds normal.  Musculoskeletal: Normal range of motion.  Neurological: He is alert and oriented to person, place, and time. He has normal reflexes. No cranial nerve deficit or sensory deficit. He displays a negative Romberg sign. Coordination and gait normal.       5/5 and equal upper and lower extremity strength.  No past pointing.  No pronator drift.    Skin: Skin is warm and dry. No rash noted.  Psychiatric: He has a normal mood and affect. His behavior is normal.    ED Course  Procedures (including critical care time)  Labs Reviewed - No data to display Ct Head Wo Contrast  04/16/2011  *RADIOLOGY REPORT*  Clinical Data:  Headaches, congestion  CT HEAD WITHOUT CONTRAST  Technique:  Contiguous axial images were obtained from the base of the skull through the vertex without contrast  Comparison:  None.  Findings:  The brain has a normal appearance without evidence for hemorrhage, acute infarction, hydrocephalus, or mass lesion.  There is no extra axial fluid collection.  The skull and paranasal sinuses are normal.  IMPRESSION: Normal CT of the head without contrast.  Original Report Authenticated By: Judie Petit. Ruel Favors, M.D.     1. Headache   2. Nasal congestion       MDM  Pt presents w/ c/o intermittent, non-traumatic headache as well as nasal congestion x 4 weeks.  No fever, focal neuro deficits or meningeal signs on exam.  CT head ordered d/t duration and because h/a reported to be atypical.  Neg for acute pathology.  Pt referred to neuro for further evaluation.  He has been taking motrin 800mg  for pain w/out relief and seems like a reasonable person, so I d/c'd him home w/ short course of vicodin.  Also prescribed sudafed and flonase.  Recommended  using afrin for a limited time until flonase effective.  Return precautions discussed.         Otilio Miu, [place] 04/16/11 [phone]

## 2011-04-16 NOTE — ED Notes (Addendum)
Headache for about a month. Lots of mucus when he blows nose. When he sleeps at night he has problems with nasal congestion. Headaches are intermittent. [place] nasal passage lavaged. History of sinuses.

## 2011-04-16 NOTE — ED Provider Notes (Signed)
Medical screening examination/treatment/procedure(s) were performed by non-physician practitioner and as supervising physician I was immediately available for consultation/collaboration.  Raeford Razor, MD 04/16/11 2030

## 2011-04-16 NOTE — ED Notes (Signed)
Pt. Also has a headache, that he feels is related to his sinuses

## 2011-04-16 NOTE — ED Notes (Signed)
Nasal congestion for a few months.

## 2011-04-16 NOTE — ED Notes (Signed)
Congestion and h/a x 1 month head feels full

## 2011-11-07 ENCOUNTER — Emergency Department (HOSPITAL_COMMUNITY): Payer: Self-pay

## 2011-11-07 ENCOUNTER — Encounter (HOSPITAL_COMMUNITY): Payer: Self-pay | Admitting: *Deleted

## 2011-11-07 ENCOUNTER — Emergency Department (HOSPITAL_COMMUNITY)
Admission: EM | Admit: 2011-11-07 | Discharge: 2011-11-07 | Disposition: A | Discharge status: Home / Self Care | Payer: Self-pay | Attending: Emergency Medicine | Admitting: Emergency Medicine

## 2011-11-07 DIAGNOSIS — R079 Chest pain, unspecified: Secondary | ICD-10-CM | POA: Insufficient documentation

## 2011-11-07 DIAGNOSIS — I10 Essential (primary) hypertension: Secondary | ICD-10-CM | POA: Insufficient documentation

## 2011-11-07 LAB — POCT I-STAT, CHEM 8
BUN: 11 mg/dL (ref 6–23)
Calcium, Ion: 1.24 mmol/L — ABNORMAL HIGH (ref 1.12–1.23)
Creatinine, Ser: 1.1 mg/dL (ref 0.50–1.35)
Glucose, Bld: 115 mg/dL — ABNORMAL HIGH (ref 70–99)
TCO2: 26 mmol/L (ref 0–100)

## 2011-11-07 LAB — URINALYSIS, ROUTINE W REFLEX MICROSCOPIC
Bilirubin Urine: NEGATIVE
Glucose, UA: NEGATIVE mg/dL
Ketones, ur: NEGATIVE mg/dL
Leukocytes, UA: NEGATIVE
Protein, ur: NEGATIVE mg/dL

## 2011-11-07 MED ORDER — GI COCKTAIL ~~LOC~~
30.0000 mL | Freq: Once | ORAL | Status: AC
  Administered 2011-11-07: 30 mL via ORAL
  Filled 2011-11-07: qty 30

## 2011-11-07 MED ORDER — SODIUM CHLORIDE 0.9 % IV BOLUS (SEPSIS)
500.0000 mL | Freq: Once | INTRAVENOUS | Status: AC
  Administered 2011-11-07: 1000 mL via INTRAVENOUS

## 2011-11-07 MED ORDER — RANITIDINE HCL 150 MG PO TABS: 150.0000 mg | ORAL_TABLET | Freq: Two times a day (BID) | ORAL | Status: DC

## 2011-11-07 MED ORDER — FAMOTIDINE IN NACL 20-0.9 MG/50ML-% IV SOLN
20.0000 mg | Freq: Once | INTRAVENOUS | Status: AC
  Administered 2011-11-07: 20 mg via INTRAVENOUS
  Filled 2011-11-07: qty 50

## 2011-11-07 NOTE — ED Notes (Signed)
MD at bedside. hospitality

## 2011-11-07 NOTE — ED Provider Notes (Signed)
History     CSN: [id number]  Arrival date & time 11/07/11  1050   First MD Initiated Contact with Patient 11/07/11 1115      Chief Complaint  Patient presents with  . Chest Pain    (Consider location/radiation/quality/duration/timing/severity/associated sxs/prior treatment) HPI  41 y.o. male in no acute distress complaining of intermittent, dull , poorly localized chest pain rated at 5 of 10 over the last 2 weeks. Patient has noticed when he wakes up in the morning and spits out phlegm he is blood-tinged x 2 days. Patient showed me a picture that he took with very mild red tinge to the phlegm. He denies fever, cough, shortness of breath, nausea/vomiting, or abdominal pain. He is also complaining of exacerbation to chronic low back pain. Denies change in bowel or bladder habits.    Past Medical History  Diagnosis Date  . Hypertension   . Herpes simplex     History reviewed. No pertinent past surgical history.  No family history on file.  History  Substance Use Topics  . Smoking status: Never Smoker   . Smokeless tobacco: Not on file  . Alcohol Use: No      Review of Systems  Constitutional: Negative for fever.  Respiratory: Negative for shortness of breath.   Cardiovascular: Positive for chest pain.  All other systems reviewed and are negative.    Allergies  Omeprazole and Rabeprazole sodium  Home Medications   Current Outpatient Rx  Name Route Sig Dispense Refill  . LISINOPRIL-HYDROCHLOROTHIAZIDE PO Oral Take by mouth.    Marland Kitchen LORATADINE PO Oral Take by mouth.    Marland Kitchen RANITIDINE HCL PO Oral Take by mouth.      BP 157/94  Pulse 79  Temp 98.2 F (36.8 C) (Oral)  Resp 18  SpO2 100%  Physical Exam  Nursing note and vitals reviewed. Constitutional: He is oriented to person, place, and time. He appears well-developed and well-nourished. No distress.  HENT:  Head: Normocephalic and atraumatic.       No oropharynx lesions.   Eyes: Conjunctivae and EOM are  normal. Pupils are equal, round, and reactive to light.  Neck: Normal range of motion.  Cardiovascular: Normal rate.   Pulmonary/Chest: Effort normal and breath sounds normal. No respiratory distress. He has no wheezes. He has no rales. He exhibits no tenderness.  Abdominal: Soft. Bowel sounds are normal. He exhibits no distension and no mass. There is no tenderness. There is no rebound and no guarding.  Musculoskeletal: Normal range of motion.  Neurological: He is alert and oriented to person, place, and time.  Skin: Skin is warm and dry.  Psychiatric: He has a normal mood and affect.    ED Course  Procedures (including critical care time)  Labs Reviewed  POCT I-STAT, CHEM 8 - Abnormal; Notable for the following:    Glucose, Bld 115 (*)     Calcium, Ion 1.24 (*)     All other components within normal limits  URINALYSIS, ROUTINE W REFLEX MICROSCOPIC  POCT I-STAT TROPONIN I  D-DIMER, QUANTITATIVE   Dg Chest 2 View  11/07/2011  *RADIOLOGY REPORT*  Clinical Data: Chest pain.  CHEST - 2 VIEW  Comparison: 11/26/2008  Findings: Cardiac and mediastinal contours appear normal.  The lungs appear clear.  No pleural effusion is identified.  IMPRESSION:  No significant abnormality identified.   Original Report Authenticated By: Dellia Cloud, M.D.       Date: 11/07/2011  Rate: 80  Rhythm: normal sinus rhythm  QRS Axis: normal  Intervals: normal  ST/T Wave abnormalities: nonspecific ST/T changes  Conduction Disutrbances:none  Narrative Interpretation: ST depression with T-wave inversion in the inferior leads however this is consistent with prior EKG from 10th of September 2010 no reciprocal changes  Old EKG Reviewed: unchanged   1. Chest pain       MDM  dDx GERD/gastritis ACS  1230PM - Pt has no pain  History is not consistent with cardiac origin of pain. Patient also has no risk factors. EKG and troponin is negative chest x-ray is also negative. I believe the pain to be  originating from an excess of acid although home medications with ranitidine patient does not take this. I will discharge the patient with Pepcid and return precautions I will also refer to primary care. Patient is low risk by Wells criteria, he's not pertinent negatives because of hemoptysis,  d-dimer is negative. Low concern for any acute problem I will discharge him with ranitidine and  instruct him to followup as an outpatient.   Wynetta Emery, PA-C 11/07/11 1423

## 2011-11-07 NOTE — ED Notes (Signed)
Pt is here with chest pain with intermittent chest pain for last 3 weeks, no sob or fever.  Pt reports black color when coughing up first thing in the morning and then it is normal.  Pt is complaining of pain to lower back

## 2011-11-08 NOTE — ED Provider Notes (Signed)
Medical screening examination/treatment/procedure(s) were performed by non-physician practitioner and as supervising physician I was immediately available for consultation/collaboration.    Nelia Shi, MD 11/08/11 [phone]

## 2012-01-02 ENCOUNTER — Emergency Department (HOSPITAL_COMMUNITY)
Admission: EM | Admit: 2012-01-02 | Discharge: 2012-01-02 | Disposition: A | Discharge status: Home / Self Care | Payer: Self-pay | Attending: Emergency Medicine | Admitting: Emergency Medicine

## 2012-01-02 ENCOUNTER — Emergency Department (HOSPITAL_COMMUNITY): Payer: Self-pay

## 2012-01-02 ENCOUNTER — Encounter (HOSPITAL_COMMUNITY): Payer: Self-pay

## 2012-01-02 DIAGNOSIS — Z79899 Other long term (current) drug therapy: Secondary | ICD-10-CM | POA: Insufficient documentation

## 2012-01-02 DIAGNOSIS — I10 Essential (primary) hypertension: Secondary | ICD-10-CM | POA: Insufficient documentation

## 2012-01-02 DIAGNOSIS — R51 Headache: Secondary | ICD-10-CM | POA: Insufficient documentation

## 2012-01-02 LAB — POCT I-STAT, CHEM 8
BUN: 11 mg/dL (ref 6–23)
Chloride: 99 mEq/L (ref 96–112)
Creatinine, Ser: 0.4 mg/dL — ABNORMAL LOW (ref 0.50–1.35)
Glucose, Bld: 86 mg/dL (ref 70–99)
Potassium: 3.9 mEq/L (ref 3.5–5.1)
Sodium: 151 mEq/L — ABNORMAL HIGH (ref 135–145)

## 2012-01-02 MED ORDER — IBUPROFEN 800 MG PO TABS: 800.0000 mg | ORAL_TABLET | Freq: Once | ORAL | Status: AC

## 2012-01-02 MED ORDER — IBUPROFEN 800 MG PO TABS
800.0000 mg | ORAL_TABLET | Freq: Once | ORAL | Status: AC
  Administered 2012-01-02: 800 mg via ORAL
  Filled 2012-01-02: qty 1

## 2012-01-02 NOTE — ED Notes (Signed)
Pt complains of pain and pressure in head and spitting up blood.

## 2012-01-02 NOTE — ED Provider Notes (Signed)
History     CSN: [id number]  Arrival date & time 01/02/12  1231   First MD Initiated Contact with Patient 01/02/12 1819      Chief Complaint  Patient presents with  . Headache    (Consider location/radiation/quality/duration/timing/severity/associated sxs/prior treatment) HPI Comments: 41 year old male with a history of hypertension presents emergency department with a chief complaint of headache.  Onset of symptoms began approximately one week ago and have been constant since the onset. HA located on top of head & described as a pressure. He denies any relief with Tylenol or ibuprofen.  Exacerbating or alleviating factors are unknown.  Patient denies associated symptoms including change in vision, photophobia, nausea, vomiting, ataxia, disequilibrium, extremity weakness or numbness, recent head trauma, fever, night sweats or chills.  Patient states that previously he was followed by [facility] and has not seen a practitioner since they're closing.  He states he is compliant with his hypertensive medication.  No other complaints this time.  The history is provided by the patient.    Past Medical History  Diagnosis Date  . Hypertension   . Herpes simplex     No past surgical history on file.  No family history on file.  History  Substance Use Topics  . Smoking status: Never Smoker   . Smokeless tobacco: Not on file  . Alcohol Use: No      Review of Systems  Constitutional: Negative for fever, chills, diaphoresis, activity change and fatigue.  HENT: Negative for ear pain, congestion, facial swelling, neck pain, neck stiffness, sinus pressure and tinnitus.   Eyes: Negative for photophobia, redness and visual disturbance.  Respiratory: Negative for cough, shortness of breath, wheezing and stridor.   Cardiovascular: Negative for chest pain.  Gastrointestinal: Negative for nausea, vomiting and abdominal pain.  Musculoskeletal: Negative for myalgias and gait problem.  Skin:  Negative for rash.  Neurological: Positive for headaches. Negative for dizziness, syncope, speech difficulty, weakness, light-headedness and numbness.       No bowel or bladder incontinence.  Psychiatric/Behavioral: Negative for confusion.    Allergies  Omeprazole and Rabeprazole sodium  Home Medications   Current Outpatient Rx  Name Route Sig Dispense Refill  . ACETAMINOPHEN 500 MG PO TABS Oral Take 1,000 mg by mouth every 6 (six) hours as needed. For pain    . ACYCLOVIR 200 MG PO CAPS Oral Take 200 mg by mouth daily.    Marland Kitchen LISINOPRIL-HYDROCHLOROTHIAZIDE 20-12.5 MG PO TABS Oral Take 1 tablet by mouth daily.      BP 140/88  Pulse 68  Temp 98.5 F (36.9 C) (Oral)  Resp 14  SpO2 100%  Physical Exam  Nursing note and vitals reviewed. Constitutional: He is oriented to person, place, and time. He appears well-developed and well-nourished.       NAD  HENT:  Head: Normocephalic and atraumatic.  Eyes: Conjunctivae normal and EOM are normal. Pupils are equal, round, and reactive to light. No scleral icterus.  Neck: Normal range of motion.       Neck supple with no nuchal rigidity, full pain free ROM. No carotid bruit  Cardiovascular: Normal rate, regular rhythm, normal heart sounds and intact distal pulses.   Pulmonary/Chest: Effort normal and breath sounds normal. No respiratory distress.  Musculoskeletal: Normal range of motion.  Neurological: He is alert and oriented to person, place, and time. He has normal strength.       CN III-XII intact, good coordination, normal gait, strength 5/5 bilaterally, intact distal sensation.  Skin:  Skin is warm and dry.       No rash, non diaphoretic    ED Course  Procedures (including critical care time)  Labs Reviewed  POCT I-STAT, CHEM 8 - Abnormal; Notable for the following:    Sodium 151 (*)     Creatinine, Ser 0.40 (*)     Calcium, Ion 1.44 (*)     All other components within normal limits   Ct Head Wo Contrast  01/02/2012   *RADIOLOGY REPORT*  Clinical Data: Severe headache  CT HEAD WITHOUT CONTRAST  Technique:  Contiguous axial images were obtained from the base of the skull through the vertex without contrast.  Comparison: 11/26/2008  Findings: There is no evidence for acute hemorrhage, hydrocephalus, mass lesion, or abnormal extra-axial fluid collection.  No definite CT evidence of acute infarct.  Bone windows show the visualized portions of the paranasal sinuses to be clear.  IMPRESSION: Stable.  Normal exam.   Original Report Authenticated By: ERIC A. MANSELL, M.D.      No diagnosis found.  6:23 PM  Pt not in room   MDM  HA  Pt HA treated and improved while in ED.  Imaging reviewed & PE non concerning for [facility], ICH, Meningitis, or temporal arteritis. Pt is afebrile with no focal neuro deficits, nuchal rigidity, or change in vision. Pt is to follow up with PCP to discuss prophylactic medication. Pt verbalizes understanding and is agreeable with plan to dc. Will give information on [facility] clinic.          Jaci Carrel, [place] 01/02/12 2001

## 2012-01-04 NOTE — ED Provider Notes (Signed)
Medical screening examination/treatment/procedure(s) were performed by non-physician practitioner and as supervising physician I was immediately available for consultation/collaboration.   Loren Racer, MD 01/04/12 [phone]

## 2012-01-14 ENCOUNTER — Emergency Department (INDEPENDENT_AMBULATORY_CARE_PROVIDER_SITE_OTHER)
Admission: EM | Admit: 2012-01-14 | Discharge: 2012-01-14 | Disposition: A | Discharge status: Home / Self Care | Payer: No Typology Code available for payment source | Source: Home / Self Care | Attending: Family Medicine | Admitting: Family Medicine

## 2012-01-14 ENCOUNTER — Encounter (HOSPITAL_COMMUNITY): Payer: Self-pay

## 2012-01-14 DIAGNOSIS — R51 Headache: Secondary | ICD-10-CM

## 2012-01-14 DIAGNOSIS — I10 Essential (primary) hypertension: Secondary | ICD-10-CM

## 2012-01-14 MED ORDER — AMLODIPINE BESYLATE 5 MG PO TABS: 10.0000 mg | ORAL_TABLET | Freq: Every day | ORAL | Status: DC

## 2012-01-14 MED ORDER — LISINOPRIL-HYDROCHLOROTHIAZIDE 20-12.5 MG PO TABS: 1.0000 | ORAL_TABLET | Freq: Two times a day (BID) | ORAL | Status: DC

## 2012-01-14 NOTE — ED Provider Notes (Signed)
History     CSN: [id number]  Arrival date & time 01/14/12  1317   First MD Initiated Contact with Patient 01/14/12 1510      Chief Complaint  Patient presents with  . Numbness    (Consider location/radiation/quality/duration/timing/severity/associated sxs/prior treatment) Patient is a 41 y.o. male presenting with headaches. The history is provided by the patient.  Headache The primary symptoms include headaches. Primary symptoms do not include dizziness. The symptoms began yesterday. The symptoms are waxing and waning. The neurological symptoms are focal.  The headache is not associated with aura, photophobia, neck stiffness, weakness or loss of balance.  Additional symptoms do not include neck stiffness, weakness, pain, lower back pain, leg pain, loss of balance, photophobia, aura, hallucinations, nystagmus, taste disturbance, hyperacusis, hearing loss, tinnitus, vertigo, anxiety, irritability or dysphoric mood. Medical issues also include hypertension. Medical issues do not include seizures, cerebral vascular accident, cancer, alcohol use, drug use, diabetes or recent surgery. Workup history includes CT scan. Procedure history comments: 2 weeks ago, no abnormal intracanial findings.  patient same "pressure like" headache he experienced 2 weeks ago prompting ED evalution.  States today noted tingling in lips as well transient paresthesias in right lower extremity.  Past Medical History  Diagnosis Date  . Hypertension   . Herpes simplex     History reviewed. No pertinent past surgical history.  No family history on file.  History  Substance Use Topics  . Smoking status: Never Smoker   . Smokeless tobacco: Not on file  . Alcohol Use: No      Review of Systems  Constitutional: Negative.  Negative for irritability.  HENT: Negative for hearing loss, neck stiffness and tinnitus.   Eyes: Negative for photophobia.  Respiratory: Negative.   Cardiovascular: Negative.     Gastrointestinal: Negative.   Neurological: Positive for headaches. Negative for dizziness, vertigo, weakness, light-headedness and loss of balance.  Psychiatric/Behavioral: Negative for hallucinations and dysphoric mood.  All other systems reviewed and are negative.    Allergies  Omeprazole and Rabeprazole sodium  Home Medications   Current Outpatient Rx  Name Route Sig Dispense Refill  . ACETAMINOPHEN 500 MG PO TABS Oral Take 1,000 mg by mouth every 6 (six) hours as needed. For pain    . ACYCLOVIR 200 MG PO CAPS Oral Take 200 mg by mouth daily.    Marland Kitchen AMLODIPINE BESYLATE 5 MG PO TABS Oral Take 2 tablets (10 mg total) by mouth daily. 30 tablet 1  . LISINOPRIL-HYDROCHLOROTHIAZIDE 20-12.5 MG PO TABS Oral Take 1 tablet by mouth 2 (two) times daily. 60 tablet 2    BP 174/63  Pulse 88  Temp 98 F (36.7 C) (Oral)  Resp 18  SpO2 100%  Physical Exam  Nursing note and vitals reviewed. Constitutional: He is oriented to person, place, and time. Vital signs are normal. He appears well-developed and well-nourished. He is active and cooperative.  HENT:  Head: Normocephalic.  Right Ear: External ear normal.  Left Ear: External ear normal.  Nose: Nose normal.  Mouth/Throat: No oropharyngeal exudate.  Eyes: Conjunctivae normal and EOM are normal. Pupils are equal, round, and reactive to light. No scleral icterus.       No pastpointing  Neck: Trachea normal, normal range of motion and full passive range of motion without pain. Neck supple. No spinous process tenderness and no muscular tenderness present.  Cardiovascular: Normal rate, regular rhythm, normal heart sounds, intact distal pulses and normal pulses.   Pulmonary/Chest: Effort normal and breath sounds normal.  Abdominal: Soft. Normal appearance. There is no tenderness.  Lymphadenopathy:       Head (right side): No submental, no submandibular, no tonsillar, no preauricular, no posterior auricular and no occipital adenopathy present.        Head (left side): No submental, no submandibular, no tonsillar, no preauricular, no posterior auricular and no occipital adenopathy present.    He has no cervical adenopathy.  Neurological: He is alert and oriented to person, place, and time. He has normal strength and normal reflexes. No cranial nerve deficit or sensory deficit. Coordination and gait normal. GCS eye subscore is 4. GCS verbal subscore is 5. GCS motor subscore is 6.  Skin: Skin is warm and dry.  Psychiatric: He has a normal mood and affect. His speech is normal and behavior is normal. Judgment and thought content normal. Cognition and memory are normal.    ED Course  Procedures (including critical care time)  Labs Reviewed - No data to display No results found. EKG-possible left atrial enlargement, t wave abnormality, consider lateral ischemia  1. HYPERTENSION   2. Headache       MDM  Chart reviewed from ED visit on  01/02/12.  Will obtain EKG and increase bp medication, in addition to adding a calcium channel blocker to achieve improved blood pressure readings.  Patient education in regards to blood pressure management, will need to follow up with primary care provider in 2-4 weeks.   EKG findings shown and discussed with Dr. Caron Presume, agrees with previous plan, pt to follow up with pcp.       Johnsie Kindred, NP 01/14/12 1705

## 2012-01-14 NOTE — ED Notes (Signed)
Patient states that he has a hx of headache pain/pressure in the top of his head, he has also experienced numbness and tingling in his lips and extremities, states was recently seen in ER, they did CT scan that was normal, states there is a strong family history of stroke on moms side

## 2012-01-15 NOTE — ED Provider Notes (Signed)
Medical screening examination/treatment/procedure(s) were performed by resident physician or non-physician practitioner and as supervising physician I was immediately available for consultation/collaboration.   Deryck Hippler DOUGLAS MD.    Lauralyn Shadowens D Tylee Newby, MD 01/15/12 0912 

## 2012-06-02 ENCOUNTER — Encounter (HOSPITAL_COMMUNITY): Payer: Self-pay | Admitting: Emergency Medicine

## 2012-06-02 ENCOUNTER — Emergency Department (HOSPITAL_COMMUNITY): Payer: Self-pay

## 2012-06-02 ENCOUNTER — Emergency Department (HOSPITAL_COMMUNITY)
Admission: EM | Admit: 2012-06-02 | Discharge: 2012-06-02 | Disposition: A | Discharge status: Home / Self Care | Payer: Self-pay | Attending: Emergency Medicine | Admitting: Emergency Medicine

## 2012-06-02 DIAGNOSIS — S39012A Strain of muscle, fascia and tendon of lower back, initial encounter: Secondary | ICD-10-CM

## 2012-06-02 DIAGNOSIS — Z79899 Other long term (current) drug therapy: Secondary | ICD-10-CM | POA: Insufficient documentation

## 2012-06-02 DIAGNOSIS — I10 Essential (primary) hypertension: Secondary | ICD-10-CM | POA: Insufficient documentation

## 2012-06-02 DIAGNOSIS — Z8619 Personal history of other infectious and parasitic diseases: Secondary | ICD-10-CM | POA: Insufficient documentation

## 2012-06-02 DIAGNOSIS — X58XXXA Exposure to other specified factors, initial encounter: Secondary | ICD-10-CM | POA: Insufficient documentation

## 2012-06-02 DIAGNOSIS — S335XXA Sprain of ligaments of lumbar spine, initial encounter: Secondary | ICD-10-CM | POA: Insufficient documentation

## 2012-06-02 DIAGNOSIS — Y939 Activity, unspecified: Secondary | ICD-10-CM | POA: Insufficient documentation

## 2012-06-02 DIAGNOSIS — Y929 Unspecified place or not applicable: Secondary | ICD-10-CM | POA: Insufficient documentation

## 2012-06-02 MED ORDER — KETOROLAC TROMETHAMINE 60 MG/2ML IM SOLN
60.0000 mg | Freq: Once | INTRAMUSCULAR | Status: AC
  Administered 2012-06-02: 60 mg via INTRAMUSCULAR
  Filled 2012-06-02: qty 2

## 2012-06-02 MED ORDER — IBUPROFEN 800 MG PO TABS: 800.0000 mg | ORAL_TABLET | Freq: Three times a day (TID) | ORAL | Status: DC | PRN

## 2012-06-02 NOTE — ED Notes (Signed)
Pt c/o lower back pain x 6 months; pt sts seen here for same

## 2012-06-02 NOTE — ED Provider Notes (Signed)
History     CSN: [id number]  Arrival date & time 06/02/12  1148   First MD Initiated Contact with Patient 06/02/12 1340      Chief Complaint  Patient presents with  . Back Pain    (Consider location/radiation/quality/duration/timing/severity/associated sxs/prior treatment) HPI Kevin Little is a 42 year old male with a history of hypertension who presents with lumbar back pain. The pain started 4 months ago and has not gotten better. He reports coming to the ED about 4 months ago and that he had some imaging done on his lumbar spine, but there were no abnormalities found. He states the pain has not gone away but not worsened. He rates it 5/10 at worst, with no radiation, and no specific movements seem to make it worse. There was no injury or trauma at the onset of pain. He reports he is still about to go about his daily activities and duties at work. He plays soccer on a regular basis and reports the pain does not worsen with activity, but he has an awareness of pain in his low back. He reports using icy hot cream for the last 2 months with some relief. He tried using a heat pad for one month, but got no relief of the pain. He has not tried any medications. He denies numbness, tingling, or urinary or stool incontinence.  Past Medical History  Diagnosis Date  . Hypertension   . Herpes simplex     History reviewed. No pertinent past surgical history.  History reviewed. No pertinent family history.  History  Substance Use Topics  . Smoking status: Never Smoker   . Smokeless tobacco: Not on file  . Alcohol Use: No      Review of Systems  Allergies  Omeprazole and Rabeprazole sodium  Home Medications   Current Outpatient Rx  Name  Route  Sig  Dispense  Refill  . amLODipine (NORVASC) 5 MG tablet   Oral   Take 2 tablets (10 mg total) by mouth daily.   30 tablet   1   . lisinopril-hydrochlorothiazide (PRINZIDE,ZESTORETIC) 20-12.5 MG per tablet   Oral   Take 1 tablet by  mouth 2 (two) times daily.   60 tablet   2     BP 140/77  Pulse 80  Temp(Src) 97.8 F (36.6 C) (Oral)  Resp 18  SpO2 98%  Physical Exam  Nursing note and vitals reviewed. Constitutional: He is oriented to person, place, and time. He appears well-developed and well-nourished.  HENT:  Head: Normocephalic and atraumatic.  Pulmonary/Chest: Effort normal.  Musculoskeletal:       Lumbar back: He exhibits normal range of motion, no bony tenderness and no deformity.       Back:  Neurological: He is alert and oriented to person, place, and time. He has normal reflexes. He displays normal reflexes. He exhibits normal muscle tone. Coordination normal.  Skin: Skin is warm and dry. No rash noted.    ED Course  Procedures (including critical care time)  Patient has negative x-rays of his lumbar spine.  Patient is advised to followup with his Dr. or an urgent care is also given referral to neurosurgery. Patient has no neurological deficits noted on exam, and normal motor function in all 4 extremity.  Patient has normal reflexes as well.  MDM          Carlyle Dolly, PA-C 06/04/12 1322

## 2012-06-04 NOTE — ED Provider Notes (Signed)
Medical screening examination/treatment/procedure(s) were performed by non-physician practitioner and as supervising physician I was immediately available for consultation/collaboration.   Trentyn Boisclair, MD 06/04/12 1541 

## 2012-07-31 ENCOUNTER — Emergency Department (HOSPITAL_COMMUNITY)
Admission: EM | Admit: 2012-07-31 | Discharge: 2012-07-31 | Disposition: A | Discharge status: Home / Self Care | Payer: Self-pay | Attending: Emergency Medicine | Admitting: Emergency Medicine

## 2012-07-31 ENCOUNTER — Encounter (HOSPITAL_COMMUNITY): Payer: Self-pay

## 2012-07-31 DIAGNOSIS — M549 Dorsalgia, unspecified: Secondary | ICD-10-CM

## 2012-07-31 DIAGNOSIS — G8929 Other chronic pain: Secondary | ICD-10-CM

## 2012-07-31 DIAGNOSIS — I10 Essential (primary) hypertension: Secondary | ICD-10-CM | POA: Insufficient documentation

## 2012-07-31 DIAGNOSIS — M545 Low back pain, unspecified: Secondary | ICD-10-CM | POA: Insufficient documentation

## 2012-07-31 DIAGNOSIS — Z8619 Personal history of other infectious and parasitic diseases: Secondary | ICD-10-CM | POA: Insufficient documentation

## 2012-07-31 MED ORDER — METHOCARBAMOL 500 MG PO TABS: 500.0000 mg | ORAL_TABLET | Freq: Two times a day (BID) | ORAL | Status: DC

## 2012-07-31 NOTE — ED Notes (Addendum)
Lower back pain denies any injuries, increases with movement.  Pt. Would like to know what is causing it,   Possible MRI

## 2012-07-31 NOTE — ED Provider Notes (Signed)
History    This chart was scribed for Junious Silk (PA) non-physician practitioner working with Gerhard Munch, MD by Sofie Rower, ED Scribe. This patient was seen in room TR10C/TR10C and the patient's care was started at 4:55PM.   CSN: [id number]  Arrival date & time 07/31/12  1611   First MD Initiated Contact with Patient 07/31/12 1655      Chief Complaint  Patient presents with  . Back Pain    (Consider location/radiation/quality/duration/timing/severity/associated sxs/prior treatment) The history is provided by the patient. No language interpreter was used.    Kevin Little is a 42 y.o. male , with a hx of hypertension and herpes simplex, who presents to the Emergency Department complaining of sudden, progressively worsening, non radiating lumbar back pain, onset two days ago (07/29/12). The pt reports he hs been experiencing lower back pain for the past year, however, two days ago, the pt's back pain has become severely worse. The pt characterizes his back pain as a sharp, painful sensation, intensified by certain movements and positions, specifically bending forwards at the waist. The pt has taken ibuprofen which does not provide relief of the back pain.   The pt denies bladder/bowel incontinence. Furthermore, the pt denies any hx of diabetes, hx of cancer, hx of IV drug use nor injury which may have prompted his back pain at the present point and time.   The pt does not smoke or drink alcohol.   Pt does not have a PCP.    Past Medical History  Diagnosis Date  . Hypertension   . Herpes simplex     History reviewed. No pertinent past surgical history.  No family history on file.  History  Substance Use Topics  . Smoking status: Never Smoker   . Smokeless tobacco: Not on file  . Alcohol Use: No      Review of Systems  Musculoskeletal: Positive for back pain.  All other systems reviewed and are negative.    Allergies  Omeprazole and Rabeprazole sodium  Home  Medications  No current outpatient prescriptions on file.  BP 136/86  Pulse 70  Temp(Src) 98.4 F (36.9 C) (Oral)  Resp 12  SpO2 99%  Physical Exam  Nursing note and vitals reviewed. Constitutional: He is oriented to person, place, and time. He appears well-developed and well-nourished. No distress.  HENT:  Head: Normocephalic and atraumatic.  Right Ear: External ear normal.  Left Ear: External ear normal.  Nose: Nose normal.  Eyes: Conjunctivae are normal.  Neck: Normal range of motion. No tracheal deviation present.  Cardiovascular: Normal rate, regular rhythm and normal heart sounds.   Pulmonary/Chest: Effort normal and breath sounds normal. No stridor.  Abdominal: Soft. He exhibits no distension. There is no tenderness.     Musculoskeletal: Normal range of motion.  Non tender to palpitation. No deformity, no step offs. Feet are flat.   Neurological: He is alert and oriented to person, place, and time.  Skin: Skin is warm and dry. He is not diaphoretic.  Psychiatric: He has a normal mood and affect. His behavior is normal.    ED Course  Procedures (including critical care time)  DIAGNOSTIC STUDIES: Oxygen Saturation is 99% on room air, normal by my interpretation.    COORDINATION OF CARE:   5:40 PM- Treatment plan discussed with patient. Pt agrees with treatment.     Labs Reviewed - No data to display No results found.   1. Back pain, chronic       MDM  Patient complains of 1 year of back pain that has recently worsened in the past few days. He was able to play soccer with his back pain, now pain is worse with movement. No neurological deficits and normal neuro exam.  Patient can walk but states is painful.  No loss of bowel or bladder control.  No concern for cauda equina.  No fever, night sweats, weight loss, h/o cancer, IVDU.  RICE protocol and pain medicine indicated and discussed with patient.        I personally performed the services described in  this documentation, which was scribed in my presence. The recorded information has been reviewed and is accurate.     Mora Bellman, PA-C 08/01/12 1227

## 2012-08-02 NOTE — ED Provider Notes (Signed)
  Medical screening examination/treatment/procedure(s) were performed by non-physician practitioner and as supervising physician I was immediately available for consultation/collaboration.    Shoshana Johal, MD 08/02/12 0848 

## 2012-12-02 ENCOUNTER — Encounter (HOSPITAL_COMMUNITY): Payer: Self-pay | Admitting: *Deleted

## 2012-12-02 ENCOUNTER — Emergency Department (HOSPITAL_COMMUNITY): Payer: No Typology Code available for payment source

## 2012-12-02 ENCOUNTER — Emergency Department (HOSPITAL_COMMUNITY)
Admission: EM | Admit: 2012-12-02 | Discharge: 2012-12-02 | Disposition: A | Discharge status: Home / Self Care | Payer: No Typology Code available for payment source | Attending: Emergency Medicine | Admitting: Emergency Medicine

## 2012-12-02 DIAGNOSIS — R51 Headache: Secondary | ICD-10-CM | POA: Insufficient documentation

## 2012-12-02 DIAGNOSIS — I10 Essential (primary) hypertension: Secondary | ICD-10-CM | POA: Insufficient documentation

## 2012-12-02 DIAGNOSIS — Z79899 Other long term (current) drug therapy: Secondary | ICD-10-CM | POA: Insufficient documentation

## 2012-12-02 DIAGNOSIS — R110 Nausea: Secondary | ICD-10-CM | POA: Insufficient documentation

## 2012-12-02 DIAGNOSIS — Z8619 Personal history of other infectious and parasitic diseases: Secondary | ICD-10-CM | POA: Insufficient documentation

## 2012-12-02 MED ORDER — ACETAMINOPHEN 500 MG PO TABS
1000.0000 mg | ORAL_TABLET | Freq: Once | ORAL | Status: AC
  Administered 2012-12-02: 1000 mg via ORAL
  Filled 2012-12-02: qty 2

## 2012-12-02 MED ORDER — METOCLOPRAMIDE HCL 5 MG/ML IJ SOLN
10.0000 mg | Freq: Once | INTRAMUSCULAR | Status: AC
  Administered 2012-12-02: 10 mg via INTRAVENOUS
  Filled 2012-12-02: qty 2

## 2012-12-02 MED ORDER — SODIUM CHLORIDE 0.9 % IV BOLUS (SEPSIS)
1000.0000 mL | Freq: Once | INTRAVENOUS | Status: AC
Start: 2012-12-02 — End: 2012-12-02
  Administered 2012-12-02: 1000 mL via INTRAVENOUS

## 2012-12-02 MED ORDER — ASPIRIN-ACETAMINOPHEN-CAFFEINE 250-250-65 MG PO TABS: 1.0000 | ORAL_TABLET | Freq: Four times a day (QID) | ORAL | Status: DC | PRN

## 2012-12-02 MED ORDER — IBUPROFEN 800 MG PO TABS
800.0000 mg | ORAL_TABLET | Freq: Once | ORAL | Status: AC
  Administered 2012-12-02: 800 mg via ORAL
  Filled 2012-12-02: qty 1

## 2012-12-02 MED ORDER — KETOROLAC TROMETHAMINE 30 MG/ML IJ SOLN
30.0000 mg | Freq: Once | INTRAMUSCULAR | Status: AC
  Administered 2012-12-02: 30 mg via INTRAVENOUS
  Filled 2012-12-02: qty 1

## 2012-12-02 NOTE — ED Provider Notes (Signed)
CSN: [id number]     Arrival date & time 12/02/12  1347 History   First MD Initiated Contact with Patient 12/02/12 1441     Chief Complaint  Patient presents with  . Headache   (Consider location/radiation/quality/duration/timing/severity/associated sxs/prior Treatment) The history is provided by the patient. No language interpreter was used.  Kevin Little is a 42 y/o M with PMHx of herpes simplex and HTN presenting to the ED with headache that has been ongoing for the past 3 days. As per patient, the headache is localized to the top of the head, feeling the pain inside of his head. Reported that the discomfort is a constant throbbing sensation without radiation. Patient reported that he feels a mild pressure behind his eyes. Stated that nothing makes the pain better or worse, reported that he has been using Tylenol with minimal relief. Reported intermittent episodes of nausea. Patient reported that he has history of high blood pressure, is on Lisinopril, reports that he is medically complaint. Denied sudden loss of vision, blurred vision, chest pain, shortness of breath, difficulty breathing, vomiting, diarrhea, abdominal pain, neck pain, neck stiffness, back pain, fever, chills, tingling, numbness, weakness, photophobia, phonophobia, head injury, falls, blood thinners. PCP none  Past Medical History  Diagnosis Date  . Hypertension   . Herpes simplex    History reviewed. No pertinent past surgical history. History reviewed. No pertinent family history. History  Substance Use Topics  . Smoking status: Never Smoker   . Smokeless tobacco: Not on file  . Alcohol Use: No    Review of Systems  Constitutional: Negative for fever and chills.  HENT: Negative for trouble swallowing, neck pain and neck stiffness.   Eyes: Negative for visual disturbance.  Respiratory: Negative for chest tightness and shortness of breath.   Cardiovascular: Negative for chest pain.  Gastrointestinal: Positive  for nausea. Negative for vomiting and abdominal pain.  Genitourinary: Negative for decreased urine volume and difficulty urinating.  Musculoskeletal: Negative for back pain.  Neurological: Positive for headaches. Negative for dizziness, weakness and numbness.  All other systems reviewed and are negative.    Allergies  Omeprazole and Rabeprazole sodium  Home Medications   Current Outpatient Rx  Name  Route  Sig  Dispense  Refill  . lisinopril-hydrochlorothiazide (PRINZIDE,ZESTORETIC) 20-12.5 MG per tablet   Oral   Take 1 tablet by mouth daily.          BP 143/96  Pulse 86  Temp(Src) 98.3 F (36.8 C) (Oral)  Resp 16  SpO2 98% Physical Exam  Nursing note and vitals reviewed. Constitutional: He is oriented to person, place, and time. He appears well-developed and well-nourished. No distress.  HENT:  Head: Normocephalic and atraumatic.  Eyes: Conjunctivae and EOM are normal. Pupils are equal, round, and reactive to light. Right eye exhibits no discharge. Left eye exhibits no discharge.  Neck: Normal range of motion. Neck supple.  Negative neck stiffness Negative nuchal rigidity Negative lymphadenopathy  Cardiovascular: Normal rate, regular rhythm and normal heart sounds.  Exam reveals no friction rub.   No murmur heard. Pulses:      Radial pulses are 2+ on the right side, and 2+ on the left side.       Dorsalis pedis pulses are 2+ on the right side, and 2+ on the left side.  Pulmonary/Chest: Effort normal and breath sounds normal. No respiratory distress. He has no wheezes. He has no rales.  Musculoskeletal: Normal range of motion.  Full ROM to upper and lower extremities bilaterally  Lymphadenopathy:    He has no cervical adenopathy.  Neurological: He is alert and oriented to person, place, and time. No cranial nerve deficit. He exhibits normal muscle tone. Coordination normal.  Cranial nerves III-XII grossly intact  Sensation intact to upper and lower  extremities Strength 5+/5+ to upper and lower extremities bilaterally with resistance, equal distribution noted Able to bring index finger to nose and lips bilaterally without difficulty, steady motions Negative slurred speech Negative asymmetry identified to face Fine motor skills intact Negative arm drift  Follows commands accordingly Responds to questions appropriately  Skin: Skin is warm and dry. No rash noted. He is not diaphoretic. No erythema.  Psychiatric: He has a normal mood and affect. His behavior is normal. Thought content normal.    ED Course  Procedures (including critical care time)  This provider reviewed patient's chart. In 12/2011 patient has same presentation of headache, CT scan was performed with negative findings.   3:33 PM Discussed case with Dr. Freida Busman. Recommended patient get IV fluids with medications.  Labs Review Labs Reviewed - No data to display Imaging Review No results found.  MDM  No diagnosis found.   Patient presenting to the ED with headache that has been ongoing for the past 3 days with associated symptoms of nausea. Denied fever, chills, blurred vision, photophobia, phonophobia, emesis. Alert and oriented. Lungs clear to auscultation bilaterally to upper and lower lobes. Heart rate and rhythm normal. Pulses palpable, distal and proximal bilaterally. Negative nuchal rigidity, neck stiffness. Negative meningeal signs. Cranial nerves grossly intact. Sensation intact. Strength intact with equal distribution.  Reviewed patient's chart and patient was seen in the ED in 12/2011 for same complaint with similar symptoms, CT scan of head without contrast performed with negative findings.  Patient stable, afebrile. As per patient he is compliant with HTN medications. Blood pressure controlled. Negative neurological deficits. Negative meningeal signs. Doubt SAH. Doubt ICH. Doubt meningitis. Doubt temporal arteritis. CT scan of head without contrast  pending.  Discussed case with Dr. Oleh Genin, PGY-III, transfer of care to Dr. Oleh Genin, PGY-III.  Raymon Mutton, PA-C 12/02/12 1727  Raymon Mutton, PA-C 12/02/12 1742

## 2012-12-02 NOTE — ED Notes (Signed)
Pt c/o HA up top of head that started 3 days ago, sts he tried taking tylenol yesterday and the day before, no relief. sts yesterday he was feeling nauseous but never vomited. Denies n/v/photophobia today. Pt also c/o pressure behind eyes. sts he has hx of HA but usually they dont last this long. Pt in nad, skin warm and dry, resp e/u.

## 2012-12-02 NOTE — ED Provider Notes (Signed)
Assumption of care note:  Patient care assumed from [facility]. See her note for further details.  Briefly, 67 y M here with a HA x 3 days.  Similar HAs before.  No alarm features.  Neuro exam non-focal.  Likely migraine.  Toradol, Reglan, IVF given. CT head pending.  Plan is for likely d/c home if he is feeling better. BP 135/85  Pulse 82  Temp(Src) 98.3 F (36.8 C) (Oral)  Resp 16  SpO2 100%   6:20 PM Patient reports that his headache is much improved. He was ambulated with a steady gait. It is felt at this time he is stable for discharge. He was given followup information for the [facility]. Prescription for Excedrin Migraine given. Return precautions reviewed.  Clinical Impression: 1. Headache   2. HTN (hypertension)     Disposition: Discharge  Condition: Good  I have discussed the results, Dx and Tx plan. They expressed understanding and agree with the plan and were told to return to ED with any worsening of condition or concern.    Discharge Medication List as of 12/02/2012  6:14 PM    START taking these medications   Details  aspirin-acetaminophen-caffeine (EXCEDRIN MIGRAINE) 250-250-65 MG per tablet Take 1 tablet by mouth every 6 (six) hours as needed (migraine headache)., Starting 12/02/2012, Until Discontinued, Print        Follow Up: [facility] [address] [place] [address] [phone] Call in 2 days    Pt seen in conjunction with Dr. Bernette Mayers.  Reine Just. Beverely Pace, MD Emergency Medicine PGY-III [phone]    Oleh Genin, MD 12/02/12 [phone]

## 2012-12-02 NOTE — ED Notes (Signed)
Pt reports having headache x 3 days, feels a pressure behind his eyes. Denies any n/v and reports no relief with tylenol.

## 2012-12-03 NOTE — ED Provider Notes (Signed)
Medical screening examination/treatment/procedure(s) were performed by non-physician practitioner and as supervising physician I was immediately available for consultation/collaboration.   Arena Lindahl B. Bernette Mayers, MD 12/03/12 1840

## 2012-12-10 NOTE — ED Provider Notes (Signed)
I saw and evaluated the patient, reviewed the resident's note and I agree with the findings and plan.  Patient with headache, similar to previous headaches, improved with standard treatment.  Gilda Crease, MD 12/10/12 [phone]

## 2013-03-30 ENCOUNTER — Emergency Department (HOSPITAL_COMMUNITY)
Admission: EM | Admit: 2013-03-30 | Discharge: 2013-03-30 | Disposition: A | Discharge status: Home / Self Care | Payer: No Typology Code available for payment source | Attending: Emergency Medicine | Admitting: Emergency Medicine

## 2013-03-30 ENCOUNTER — Encounter (HOSPITAL_COMMUNITY): Payer: Self-pay | Admitting: Emergency Medicine

## 2013-03-30 DIAGNOSIS — M545 Low back pain, unspecified: Secondary | ICD-10-CM | POA: Insufficient documentation

## 2013-03-30 DIAGNOSIS — I10 Essential (primary) hypertension: Secondary | ICD-10-CM | POA: Insufficient documentation

## 2013-03-30 DIAGNOSIS — B009 Herpesviral infection, unspecified: Secondary | ICD-10-CM | POA: Insufficient documentation

## 2013-03-30 MED ORDER — NAPROXEN 500 MG PO TABS: 500.0000 mg | ORAL_TABLET | Freq: Two times a day (BID) | ORAL | Status: DC

## 2013-03-30 NOTE — Discharge Instructions (Signed)
Call for a follow up appointment with a Family or Primary Care Provider.  °Return if Symptoms worsen.   °Take medication as prescribed.  ° °

## 2013-03-30 NOTE — ED Notes (Signed)
Discharge and follow up instructions reviewed with pt. Pt verbalized understanding.  

## 2013-03-30 NOTE — ED Notes (Signed)
Presents with lower back pain began "a long time ago. I have been seen here before for it" pain is constant, does not radiate.

## 2013-03-30 NOTE — ED Provider Notes (Signed)
CSN: [id number]     Arrival date & time 03/30/13  1659 History  This chart was scribed for non-physician practitioner Mellody DrownLauren Trayton Szabo, PA-C, working with Raeford RazorStephen Kohut, MD by Nicholos Johnsenise Iheanachor, ED scribe. This patient was seen in room TR10C/TR10C and the patient's care was started at 7:01 PM.     Chief Complaint  Patient presents with  . Back Pain   HPI Comments: Kevin GurneyFrancois Norcia is a 43 y.o. male who presents to the Emergency Department complaining of waxing and waning, recurrent lower back pain, initial onset 1 year ago; worse with rotation and bending. Pt states he is still able to move through full ROM even with pain. Pt has not followed up with a specialist for the pain but states he has been seen 2x prior for same sx and was given ibuprofen w/ little relief. Denies any injury or trauma related to back pain. Denies gait problem, numbness, tingling, saddle paresthesia, bowel incontinence, urinary rentention.   The history is provided by the patient. No language interpreter was used.    Past Medical History  Diagnosis Date  . Hypertension   . Herpes simplex    History reviewed. No pertinent past surgical history. History reviewed. No pertinent family history. History  Substance Use Topics  . Smoking status: Never Smoker   . Smokeless tobacco: Not on file  . Alcohol Use: No    Review of Systems  Constitutional: Negative for fever and chills.  Gastrointestinal: Negative for nausea, vomiting, abdominal pain and constipation.  Musculoskeletal: Positive for back pain. Negative for gait problem, neck pain and neck stiffness.  Skin: Negative for rash.  Neurological: Negative for tremors, syncope, weakness, numbness and headaches.  All other systems reviewed and are negative.   Allergies  Omeprazole and Rabeprazole sodium  Home Medications   Current Outpatient Rx  Name  Route  Sig  Dispense  Refill  . aspirin-acetaminophen-caffeine (EXCEDRIN MIGRAINE) 250-250-65 MG per tablet  Oral   Take 1 tablet by mouth every 6 (six) hours as needed (migraine headache).   20 tablet   1   . lisinopril-hydrochlorothiazide (PRINZIDE,ZESTORETIC) 20-12.5 MG per tablet   Oral   Take 1 tablet by mouth daily.          Triage Vitals: BP 128/69  Pulse 76  Temp(Src) 98.2 F (36.8 C) (Oral)  Resp 18  Wt 197 lb 8 oz (89.585 kg)  SpO2 100% Physical Exam  Nursing note and vitals reviewed. Constitutional: He is oriented to person, place, and time. He appears well-developed and well-nourished. No distress.  HENT:  Head: Normocephalic and atraumatic.  Eyes: EOM are normal.  Neck: Neck supple. No tracheal deviation present.  Cardiovascular: Normal rate.   Pulmonary/Chest: Effort normal. No respiratory distress.  Abdominal: Soft. There is no tenderness.  Musculoskeletal: Normal range of motion.       Lumbar back: He exhibits normal range of motion, no tenderness and no spasm.  No midline C-spine, T-spine, or L-spine tenderness with no step-offs, crepitus, or deformities noted.   Neurological: He is alert and oriented to person, place, and time. He has normal strength. No sensory deficit. He exhibits normal muscle tone. Gait normal. GCS eye subscore is 4. GCS verbal subscore is 5. GCS motor subscore is 6.  Reflex Scores:      Patellar reflexes are 1+ on the right side and 1+ on the left side. Skin: Skin is warm and dry.  Psychiatric: He has a normal mood and affect. His behavior is normal.  ED Course  Procedures  DIAGNOSTIC STUDIES: Oxygen Saturation is 100% on room air, normal by my interpretation.    COORDINATION OF CARE: At 7:08 PM: Discussed treatment plan with patient which includes pain medication and to follow up with a specialist. Patient agrees.   Labs Review Labs Reviewed - No data to display Imaging Review No results found.  EKG Interpretation   None       MDM   1. Low back pain    Pt with 1 year history of lower back pain, has not followed up with  anyone. Likely musculoskeletal in nature. No history trauma or recent injury, I don't feel that imaging is necessary at this time.  Will give NSAIDs and follow up as an out patient. Discussed treatment plan with the patient. Return precautions given. Reports understanding and no other concerns at this time.  Patient is stable for discharge at this time.   Meds given in ED:  Medications - No data to display  Discharge Medication List as of 03/30/2013  7:14 PM    START taking these medications   Details  naproxen (NAPROSYN) 500 MG tablet Take 1 tablet (500 mg total) by mouth 2 (two) times daily. Take with meals, Starting 03/30/2013, Until Discontinued, Print        I personally performed the services described in this documentation, which was scribed in my presence. The recorded information has been reviewed and is accurate.       Clabe Seal, PA-C 04/04/13 2115  Clabe Seal, PA-C 04/30/13 [phone]

## 2013-04-15 NOTE — ED Provider Notes (Signed)
This is in reference to ED visit, 03/30/13 with Mellody DrownLauren Parker, PA-C.   Medical screening examination/treatment/procedure(s) were performed by non-physician practitioner and as supervising physician I was immediately available for consultation/collaboration.  EKG Interpretation    Date/Time:    Ventricular Rate:    PR Interval:    QRS Duration:   QT Interval:    QTC Calculation:   R Axis:     Text Interpretation:               Raeford RazorStephen Juvencio Verdi, MD 04/15/13 1454

## 2013-04-30 NOTE — ED Provider Notes (Signed)
Medical screening examination/treatment/procedure(s) were performed by non-physician practitioner and as supervising physician I was immediately available for consultation/collaboration.  EKG Interpretation   None        Raeford RazorStephen Elcie Pelster, MD 04/30/13 1635

## 2013-07-09 ENCOUNTER — Emergency Department (INDEPENDENT_AMBULATORY_CARE_PROVIDER_SITE_OTHER): Payer: Self-pay

## 2013-07-09 ENCOUNTER — Encounter (HOSPITAL_COMMUNITY): Payer: Self-pay | Admitting: Emergency Medicine

## 2013-07-09 ENCOUNTER — Emergency Department (INDEPENDENT_AMBULATORY_CARE_PROVIDER_SITE_OTHER)
Admission: EM | Admit: 2013-07-09 | Discharge: 2013-07-09 | Disposition: A | Discharge status: Home / Self Care | Payer: Self-pay | Source: Home / Self Care

## 2013-07-09 DIAGNOSIS — J309 Allergic rhinitis, unspecified: Secondary | ICD-10-CM

## 2013-07-09 DIAGNOSIS — J029 Acute pharyngitis, unspecified: Secondary | ICD-10-CM

## 2013-07-09 DIAGNOSIS — M549 Dorsalgia, unspecified: Secondary | ICD-10-CM

## 2013-07-09 LAB — POCT RAPID STREP A: STREPTOCOCCUS, GROUP A SCREEN (DIRECT): NEGATIVE

## 2013-07-09 MED ORDER — PREDNISONE 10 MG PO TABS: ORAL_TABLET | ORAL | Status: DC

## 2013-07-09 NOTE — ED Notes (Signed)
C/o  Sore throat onset 10 days ago.  C/o low back pain x 2 yrs.  States he was checked in ED and had xrays but nothing found ( I don't see back xrays on pt. in [facility].  C/o chest is sore x 10 days.  No cough, runny nose, fever or earache.  No hx. of seasonal allergies.

## 2013-07-09 NOTE — ED Provider Notes (Signed)
CSN: [id number]     Arrival date & time 07/09/13  1628 History   None    Chief Complaint  Patient presents with  . Sore Throat  . Back Pain   (Consider location/radiation/quality/duration/timing/severity/associated sxs/prior Treatment) HPI Comments: 43 yo AAM presents with Sore throat x 10 days but denies sinus or ear pressure. He has not taken any OTC allergy medication for relief.   He also has a 2 year history of low back pain with radiation to the left SI joint. He denies trauma or strain. He thinks he has had negative Xrays in the past. He has not had Ortho eval. He notes pain is 4/10 at worse, notes just mild gnawing pain. He denies wt loss, urinary symptoms or cough.  Patient is a 43 y.o. male presenting with pharyngitis and back pain.  Sore Throat  Back Pain   Past Medical History  Diagnosis Date  . Herpes   . Hypertension   . Sinus problem    History reviewed. No pertinent past surgical history. Family History  Problem Relation Age of Onset  . Hypertension Mother   . Cancer Father     prostate   History  Substance Use Topics  . Smoking status: Never Smoker   . Smokeless tobacco: Not on file  . Alcohol Use: No    Review of Systems  HENT: Positive for postnasal drip and sore throat.   Musculoskeletal: Positive for back pain.  All other systems reviewed and are negative.   Allergies  Review of patient's allergies indicates no known allergies.  Home Medications   Prior to Admission medications   Medication Sig Start Date End Date Taking? Authorizing Provider  lisinopril-hydrochlorothiazide (PRINZIDE,ZESTORETIC) 20-12.5 MG per tablet Take 1 tablet by mouth daily.   Yes Historical Provider, MD  acyclovir (ZOVIRAX) 200 MG capsule Take 200 mg by mouth daily.    Historical Provider, MD  fluticasone (FLONASE) 50 MCG/ACT nasal spray Place 2 sprays into the nose daily. 04/16/11 04/15/12  Arie Sabinaatherine E Schinlever, PA-C  ibuprofen (ADVIL,MOTRIN) 800 MG tablet Take 800 mg  by mouth every 8 (eight) hours as needed. For pain    Historical Provider, MD   BP 127/85  Pulse 70  Temp(Src) 98.7 F (37.1 C) (Oral)  Resp 16  SpO2 99% Physical Exam  Nursing note and vitals reviewed. Constitutional: He is oriented to person, place, and time. He appears well-developed and well-nourished.  HENT:  Head: Normocephalic and atraumatic.  Right Ear: External ear normal.  Left Ear: External ear normal.  Nose: Nose normal.  Mouth/Throat: No oropharyngeal exudate.  Cloudy TM's bilaterally   Eyes: Conjunctivae and EOM are normal.  Neck: Normal range of motion. Neck supple. No JVD present. No thyromegaly present.  Cardiovascular: Normal rate, regular rhythm, normal heart sounds and intact distal pulses.   Pulmonary/Chest: Effort normal and breath sounds normal.  Abdominal: Soft. Bowel sounds are normal. He exhibits no distension and no mass. There is no tenderness. There is no rebound and no guarding.  Musculoskeletal: Normal range of motion. He exhibits no edema and no tenderness.  Completely negative exam  Lymphadenopathy:    He has no cervical adenopathy.  Neurological: He is alert and oriented to person, place, and time. He has normal reflexes. No cranial nerve deficit. Coordination normal.  Skin: Skin is warm and dry.  Psychiatric: He has a normal mood and affect. His behavior is normal. Judgment and thought content normal.    ED Course  Procedures (including critical care time)  Labs Review Labs Reviewed - No data to display  Results for orders placed in visit on 05/31/08  CBC WITH DIFFERENTIAL      Result Value Ref Range   WBC 3.8 (*) 4.0 - 10.3 10e3/uL   NEUT# 1.6  1.5 - 6.5 10e3/uL   HGB 15.0  13.0 - 17.1 g/dL   HCT 16.143.9  09.638.4 - 04.549.9 %   Platelets 194  140 - 400 10e3/uL   MCV 88.1  79.3 - 98.0 fL   MCH 30.1  27.2 - 33.4 pg   MCHC 34.1  32.0 - 36.0 g/dL   RBC 4.094.98  8.114.20 - 9.145.82 10e6/uL   RDW 12.7  11.0 - 14.6 %   lymph# 1.5  0.9 - 3.3 10e3/uL   MONO#  0.6  0.1 - 0.9 10e3/uL   Eosinophils Absolute 0.1  0.0 - 0.5 10e3/uL   Basophils Absolute 0.0  0.0 - 0.1 10e3/uL   NEUT% 42.1  39.0 - 75.0 %   LYMPH% 38.6  14.0 - 49.0 %   MONO% 15.3 (*) 0.0 - 14.0 %   EOS% 3.5  0.0 - 7.0 %   BASO% 0.5  0.0 - 2.0 %  COMPREHENSIVE METABOLIC PANEL      Result Value Ref Range   Sodium 141  135 - 145 mEq/L   Potassium 3.8  3.5 - 5.3 mEq/L   Chloride 102  96 - 112 mEq/L   CO2 31  19 - 32 mEq/L   Glucose, Bld 91  70 - 99 mg/dL   BUN 14  6 - 23 mg/dL   Creatinine, Ser 7.821.08  0.40 - 1.50 mg/dL   Total Bilirubin 1.0  0.3 - 1.2 mg/dL   Alkaline Phosphatase 69  39 - 117 U/L   AST 19  0 - 37 U/L   ALT 24  0 - 53 U/L   Total Protein 7.6  6.0 - 8.3 g/dL   Albumin 4.3  3.5 - 5.2 g/dL   Calcium 9.4  8.4 - 95.610.5 mg/dL  LACTATE DEHYDROGENASE      Result Value Ref Range   LDH 150  94 - 250 U/L   Imaging Review No results found.   MDM  1. Sore throat vs Allergic rhinitis- Allegra OTC, increase H2o, allergy hygiene explained. Strept test rapid NEG, will be sent for culture. Advised change toothbrush. Warm salt water gargles daily. 1 tsp liquid benadryl + 1 tsp liquid Maalox, MIX/ GARGLE/ SPIT as needed for pain  2. Back pain with degenerative changes- Needs PCP/ Ortho further evaluation     Berenice PrimasMelissa R Dorman Calderwood, PA-C 07/09/13 2156

## 2013-07-09 NOTE — Discharge Instructions (Signed)
Back Pain, Adult Back pain is very common. The pain often gets better over time. The cause of back pain is usually not dangerous. Most people can learn to manage their back pain on their own.  HOME CARE   Stay active. Start with short walks on flat ground if you can. Try to walk farther each day.  Do not sit, drive, or stand in one place for more than 30 minutes. Do not stay in bed.  Do not avoid exercise or work. Activity can help your back heal faster.  Be careful when you bend or lift an object. Bend at your knees, keep the object close to you, and do not twist.  Sleep on a firm mattress. Lie on your side, and bend your knees. If you lie on your back, put a pillow under your knees.  Only take medicines as told by your doctor.  Put ice on the injured area.  Put ice in a plastic bag.  Place a towel between your skin and the bag.  Leave the ice on for 15-20 minutes, 03-04 times a day for the first 2 to 3 days. After that, you can switch between ice and heat packs.  Ask your doctor about back exercises or massage.  Avoid feeling anxious or stressed. Find good ways to deal with stress, such as exercise. GET HELP RIGHT AWAY IF:   Your pain does not go away with rest or medicine.  Your pain does not go away in 1 week.  You have new problems.  You do not feel well.  The pain spreads into your legs.  You cannot control when you poop (bowel movement) or pee (urinate).  Your arms or legs feel weak or lose feeling (numbness).  You feel sick to your stomach (nauseous) or throw up (vomit).  You have belly (abdominal) pain.  You feel like you may pass out (faint). MAKE SURE YOU:   Understand these instructions.  Will watch your condition.  Will get help right away if you are not doing well or get worse. Document Released: 08/22/2007 Document Revised: 05/28/2011 Document Reviewed: 07/24/2010 [facility] Patient Information 2014 [employer], [place]. Allergic Rhinitis Allergic  rhinitis is when the mucous membranes in the nose respond to allergens. Allergens are particles in the air that cause your body to have an allergic reaction. This causes you to release allergic antibodies. Through a chain of events, these eventually cause you to release histamine into the blood stream. Although meant to protect the body, it is this release of histamine that causes your discomfort, such as frequent sneezing, congestion, and an itchy, runny nose.  CAUSES  Seasonal allergic rhinitis (hay fever) is caused by pollen allergens that may come from grasses, trees, and weeds. Year-round allergic rhinitis (perennial allergic rhinitis) is caused by allergens such as house dust mites, pet dander, and mold spores.  SYMPTOMS   Nasal stuffiness (congestion).  Itchy, runny nose with sneezing and tearing of the eyes. DIAGNOSIS  Your health care provider can help you determine the allergen or allergens that trigger your symptoms. If you and your health care provider are unable to determine the allergen, skin or blood testing may be used. TREATMENT  Allergic Rhinitis does not have a cure, but it can be controlled by:  Medicines and allergy shots (immunotherapy).  Avoiding the allergen. Hay fever may often be treated with antihistamines in pill or nasal spray forms. Antihistamines block the effects of histamine. There are over-the-counter medicines that may help with nasal congestion  and swelling around the eyes. Check with your health care provider before taking or giving this medicine.  If avoiding the allergen or the medicine prescribed do not work, there are many new medicines your health care provider can prescribe. Stronger medicine may be used if initial measures are ineffective. Desensitizing injections can be used if medicine and avoidance does not work. Desensitization is when a patient is given ongoing shots until the body becomes less sensitive to the allergen. Make sure you follow up with  your health care provider if problems continue. HOME CARE INSTRUCTIONS It is not possible to completely avoid allergens, but you can reduce your symptoms by taking steps to limit your exposure to them. It helps to know exactly what you are allergic to so that you can avoid your specific triggers. SEEK MEDICAL CARE IF:   You have a fever.  You develop a cough that does not stop easily (persistent).  You have shortness of breath.  You start wheezing.  Symptoms interfere with normal daily activities. Document Released: 11/28/2000 Document Revised: 12/24/2012 Document Reviewed: 11/10/2012 [facility] Patient Information 2014 [place], [place].

## 2013-07-10 NOTE — ED Provider Notes (Signed)
Medical screening examination/treatment/procedure(s) were performed by a resident physician or non-physician practitioner and as the supervising physician I was immediately available for consultation/collaboration.  Elek Holderness, MD    Dianelys Scinto S Tijuana Scheidegger, MD 07/10/13 0744 

## 2013-07-11 LAB — CULTURE, GROUP A STREP

## 2013-07-14 ENCOUNTER — Encounter (HOSPITAL_COMMUNITY): Payer: Self-pay | Admitting: Emergency Medicine

## 2013-08-20 ENCOUNTER — Ambulatory Visit: Payer: No Typology Code available for payment source | Attending: Internal Medicine

## 2013-09-08 ENCOUNTER — Telehealth: Payer: Self-pay

## 2013-09-08 NOTE — Telephone Encounter (Signed)
Pt would like to set up an appointment to [facility].Pt is aware of the wait time and [facility]

## 2013-09-30 ENCOUNTER — Ambulatory Visit (INDEPENDENT_AMBULATORY_CARE_PROVIDER_SITE_OTHER): Payer: Self-pay | Admitting: Family Medicine

## 2013-09-30 ENCOUNTER — Encounter: Payer: Self-pay | Admitting: Family Medicine

## 2013-09-30 VITALS — BP 126/86 | HR 71 | Temp 98.3°F | Ht 70.0 in | Wt 193.0 lb

## 2013-09-30 DIAGNOSIS — I10 Essential (primary) hypertension: Secondary | ICD-10-CM

## 2013-09-30 DIAGNOSIS — M545 Low back pain, unspecified: Secondary | ICD-10-CM

## 2013-09-30 DIAGNOSIS — Z23 Encounter for immunization: Secondary | ICD-10-CM

## 2013-09-30 DIAGNOSIS — M129 Arthropathy, unspecified: Secondary | ICD-10-CM

## 2013-09-30 LAB — BASIC METABOLIC PANEL
BUN: 11 mg/dL (ref 6–23)
CALCIUM: 9.1 mg/dL (ref 8.4–10.5)
CO2: 30 meq/L (ref 19–32)
CREATININE: 1.05 mg/dL (ref 0.50–1.35)
Chloride: 102 mEq/L (ref 96–112)
GLUCOSE: 87 mg/dL (ref 70–99)
Potassium: 3.7 mEq/L (ref 3.5–5.3)
Sodium: 139 mEq/L (ref 135–145)

## 2013-09-30 MED ORDER — DICLOFENAC SODIUM 75 MG PO TBEC: 75.0000 mg | DELAYED_RELEASE_TABLET | Freq: Two times a day (BID) | ORAL | Status: DC

## 2013-09-30 MED ORDER — LISINOPRIL-HYDROCHLOROTHIAZIDE 20-12.5 MG PO TABS: 1.0000 | ORAL_TABLET | Freq: Every day | ORAL | Status: DC

## 2013-09-30 NOTE — Progress Notes (Signed)
   Subjective:    Patient ID: Kevin Little, male    DOB: 04/15/1970, 43 y.o.   MRN: [id number]  HPI  Patient is in the office to establish care. He states that he was followed by [facility], [place].   [name] has a long history of back pain. 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 Prolonged sitting has aggravated his back pain. Patient presents for presents evaluation of low back problems.  Symptoms have been present for several years and include Pain is described as intermittent, aching, non-radiating, 4/10 in intensity. No inciting event identified. Symptoms are worst after periods of prolonged sitting. Exacerbating factors identifiable by patient are recumbency, sitting and standing. Previous workup includes back x-rays. .   Patient is also here for evaluation of hypertension. Patient currently does not have cardiac symptoms. Patient denies chest pain, chest pressure/discomfort, claudication, dyspnea, exertional chest pressure/discomfort, fatigue, lower extremity edema, near-syncope, orthopnea, palpitations and syncope. Hypertension controlled on current medication regimen.  Review of Systems  Constitutional: Negative.   HENT: Negative.   Eyes: Negative.   Respiratory: Negative.   Cardiovascular: Negative.   Gastrointestinal: Negative.   Endocrine: Negative.   Genitourinary: Negative.   Musculoskeletal: Positive for back pain (lower back) and myalgias.  Skin: Negative.   Allergic/Immunologic: Negative.   Neurological: Negative.   Hematological: Positive for adenopathy.  Psychiatric/Behavioral: Negative.        Objective:   Physical Exam  Constitutional: He is oriented to person, place, and time. Vital signs are normal. He appears well-developed and well-nourished.  HENT:  Head: Normocephalic and atraumatic.  Right Ear: External ear normal.  Left Ear: External ear normal.  Mouth/Throat: Oropharynx is clear and moist.  Eyes: Conjunctivae, EOM  and lids are normal. Pupils are equal, round, and reactive to light.  Neck: Normal range of motion. Neck supple.  Cardiovascular: Normal rate, regular rhythm, normal heart sounds and normal pulses.   Pulmonary/Chest: Effort normal and breath sounds normal. He has no wheezes.  Abdominal: Soft. Bowel sounds are normal.  Musculoskeletal: Normal range of motion.  Neurological: He is alert and oriented to person, place, and time. He has normal strength.  Skin: Skin is warm and dry.  Psychiatric: He has a normal mood and affect. His speech is normal and behavior is normal. Thought content normal. Cognition and memory are normal.         BP 126/86  Pulse 71  Temp(Src) 98.3 F (36.8 C) (Oral)  Ht 5\' 10"  (1.778 m)  Wt 193 lb (87.544 kg)  BMI 27.69 kg/m2 Assessment & Plan:  1. 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 Patient states that he has not attempted any OTC interventions to alleviate symptoms. Start Voltaren 75 mg twice daily.   2. Hypertension:  Blood pressure controlled on current medication regimen. Will check UA and BMP. Continue Lisinopril-Hydrochlorothiazide 20-12.5 mg daily.    Immunization: Tdap given without complication RTC: 3 months for hypertension and back pain Labs: UA CMP, Lipids (future order) New medication: Diclofenac 75 mg twice daily  Yurika Pereda M, FNP

## 2013-10-01 LAB — URINALYSIS, COMPLETE
BACTERIA UA: NONE SEEN
Bilirubin Urine: NEGATIVE
CASTS: NONE SEEN
Crystals: NONE SEEN
Glucose, UA: NEGATIVE mg/dL
HGB URINE DIPSTICK: NEGATIVE
KETONES UR: NEGATIVE mg/dL
Leukocytes, UA: NEGATIVE
NITRITE: NEGATIVE
PROTEIN: NEGATIVE mg/dL
Specific Gravity, Urine: 1.021 (ref 1.005–1.030)
Squamous Epithelial / LPF: NONE SEEN
UROBILINOGEN UA: 0.2 mg/dL (ref 0.0–1.0)
pH: 6.5 (ref 5.0–8.0)

## 2013-10-02 ENCOUNTER — Ambulatory Visit: Payer: Self-pay | Admitting: Family Medicine

## 2013-10-04 DIAGNOSIS — Z23 Encounter for immunization: Secondary | ICD-10-CM | POA: Insufficient documentation

## 2013-10-04 DIAGNOSIS — M545 Low back pain, unspecified: Secondary | ICD-10-CM | POA: Insufficient documentation

## 2014-01-04 ENCOUNTER — Encounter: Payer: Self-pay | Admitting: Family Medicine

## 2014-01-04 ENCOUNTER — Ambulatory Visit (INDEPENDENT_AMBULATORY_CARE_PROVIDER_SITE_OTHER): Payer: No Typology Code available for payment source | Admitting: Family Medicine

## 2014-01-04 VITALS — BP 148/93 | HR 76 | Temp 98.3°F | Resp 14 | Ht 71.0 in | Wt 198.0 lb

## 2014-01-04 DIAGNOSIS — Z23 Encounter for immunization: Secondary | ICD-10-CM | POA: Insufficient documentation

## 2014-01-04 DIAGNOSIS — I10 Essential (primary) hypertension: Secondary | ICD-10-CM

## 2014-01-04 MED ORDER — LISINOPRIL-HYDROCHLOROTHIAZIDE 20-12.5 MG PO TABS: 1.0000 | ORAL_TABLET | Freq: Every day | ORAL | Status: DC

## 2014-01-04 NOTE — Progress Notes (Signed)
   Subjective:    Patient ID: Imagene GurneyFrancois Ringler, male    DOB: 07/22/1970, 43 y.o.   MRN: [id number]  HPI   Patient here for follow-up of hypertension. He is not exercising and is adherent to low salt diet.  Blood pressure is well controlled at home. Patient denies chest pain, dyspnea, fatigue, palpitations, syncope and tachypnea. Patient does not have a history of target organ damage.   Past Medical History  Diagnosis Date  . Herpes simplex   . Herpes   . Hypertension   . Sinus problem        Review of Systems  Constitutional: Negative.  Negative for fever and fatigue.  HENT: Negative.   Eyes: Negative.   Respiratory: Negative.   Cardiovascular: Negative.   Gastrointestinal: Negative.   Endocrine: Negative.   Genitourinary: Negative.   Musculoskeletal: Negative.   Skin: Negative.   Allergic/Immunologic: Negative.   Neurological: Negative.   Hematological: Negative.   Psychiatric/Behavioral: Negative.        Objective:   Physical Exam  Constitutional: He is oriented to person, place, and time. Vital signs are normal. He appears well-developed and well-nourished.  HENT:  Head: Normocephalic and atraumatic.  Right Ear: Hearing, tympanic membrane, external ear and ear canal normal.  Left Ear: Hearing, tympanic membrane, external ear and ear canal normal.  Nose: Nose normal.  Mouth/Throat: Uvula is midline and oropharynx is clear and moist.  Eyes: Conjunctivae are normal. Pupils are equal, round, and reactive to light.  Neck: Normal range of motion. Neck supple.  Cardiovascular: Normal rate, regular rhythm, normal heart sounds, intact distal pulses and normal pulses.   Pulmonary/Chest: Effort normal and breath sounds normal.  Abdominal: Soft. Normal appearance and bowel sounds are normal.  Musculoskeletal: Normal range of motion.  Neurological: He is alert and oriented to person, place, and time. He has normal reflexes.  Skin: Skin is warm, dry and intact.  Psychiatric: He  has a normal mood and affect. His behavior is normal. Judgment and thought content normal. Cognition and memory are normal.        BP 148/93  Pulse 76  Temp(Src) 98.3 F (36.8 C) (Oral)  Resp 14  Ht 5\' 11"  (1.803 m)  Wt 198 lb (89.812 kg)  BMI 27.63 kg/m2 Assessment & Plan:  1. Essential hypertension Blood pressure stable on current medication regimen. Recommend that he follow a low fat diet, exercise, and add 6-8 glasses of water - lisinopril-hydrochlorothiazide (PRINZIDE,ZESTORETIC) 20-12.5 MG per tablet; Take 1 tablet by mouth daily.  Dispense: 90 tablet; Refill: 1  2. Need for immunization against influenza  - Flu Vaccine QUAD 36+ mos IM (Fluarix)   Hollis,Lachina M, FNP

## 2014-02-08 IMAGING — CT CT HEAD W/O CM
1 series · 16 of 30 positions shown, 20 images · non-contrast
Comparison: 11/26/2008

CLINICAL DATA: Severe headache

CT HEAD WITHOUT CONTRAST
TECHNIQUE: Contiguous axial images were obtained from the base of
the skull through the vertex without contrast.

[Series 2: head routine 4.8 h37s · axial · 0.46mm/px · z∈[-163,-34]mm · 16 of 30 slices shown, 20 images]
[im 2/30  brain]
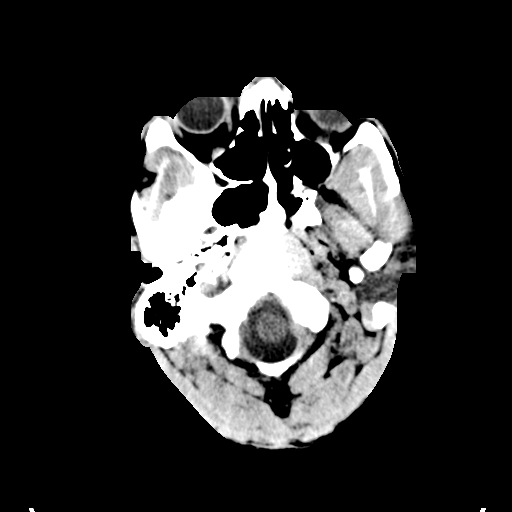
[im 2/30  bone]
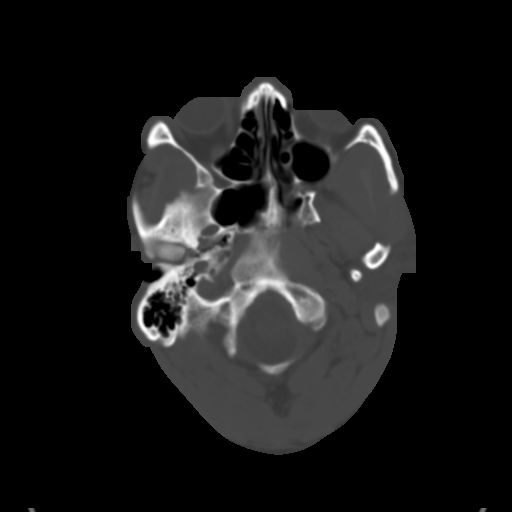
[im 4/30  brain]
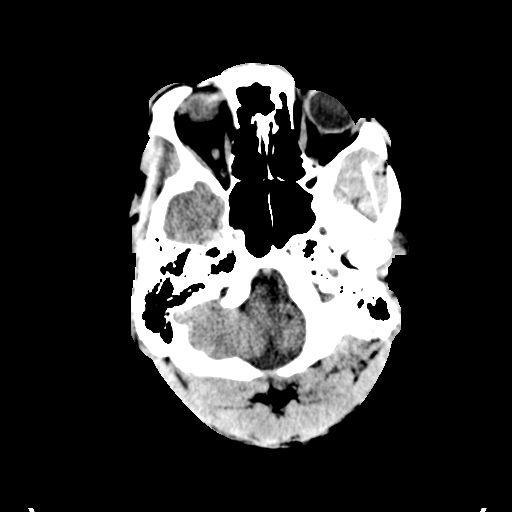
[im 6/30  brain]
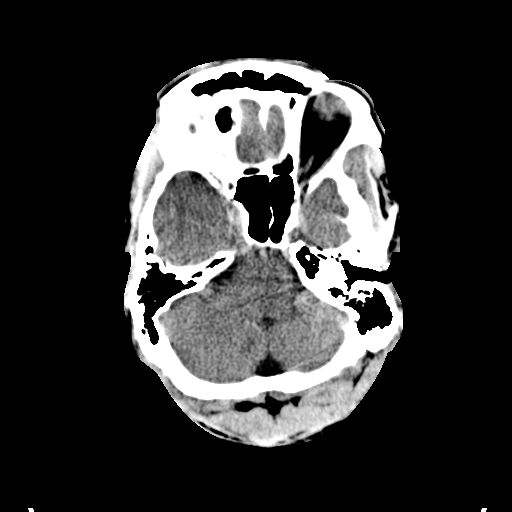
[im 8/30  brain]
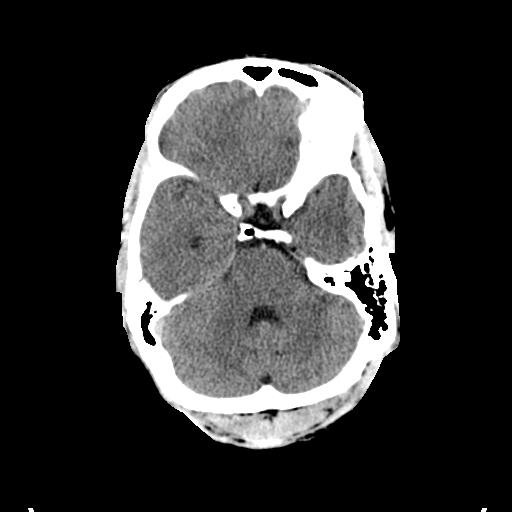
[im 9/30  brain]
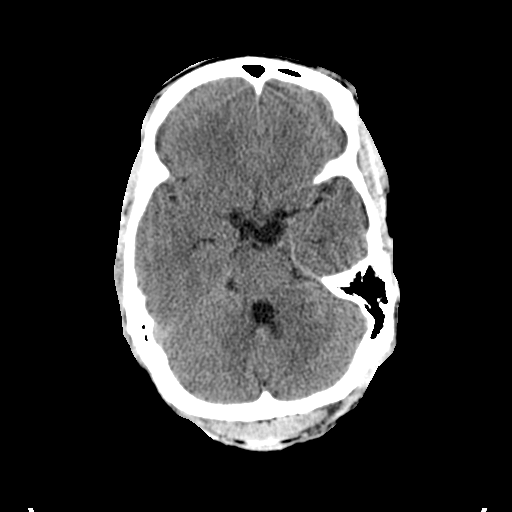
[im 9/30  bone]
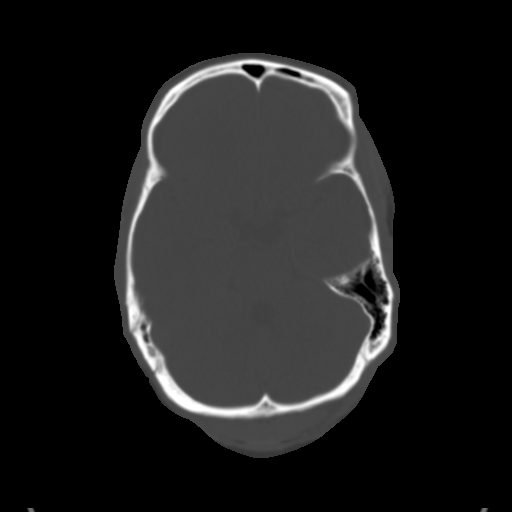
[im 11/30  brain]
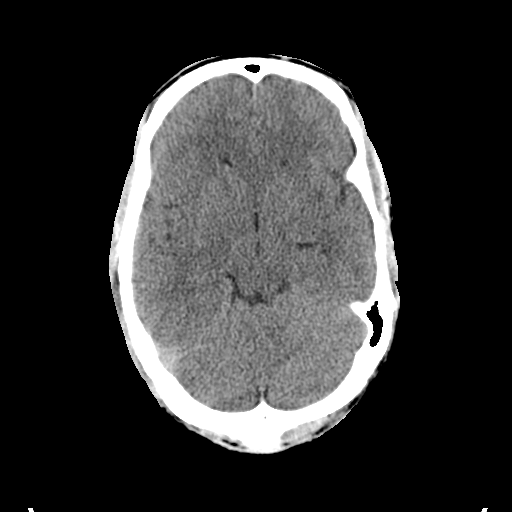
[im 13/30  brain]
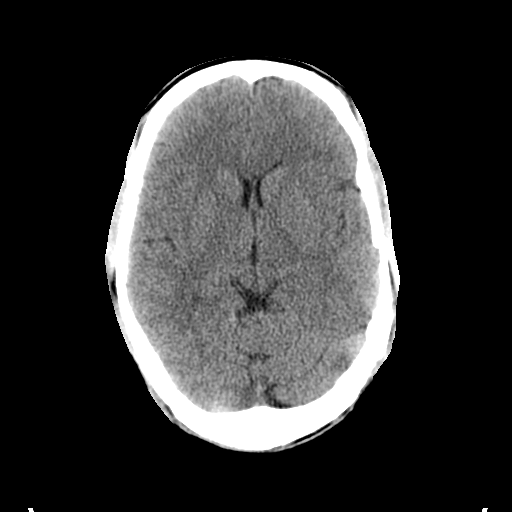
[im 15/30  brain]
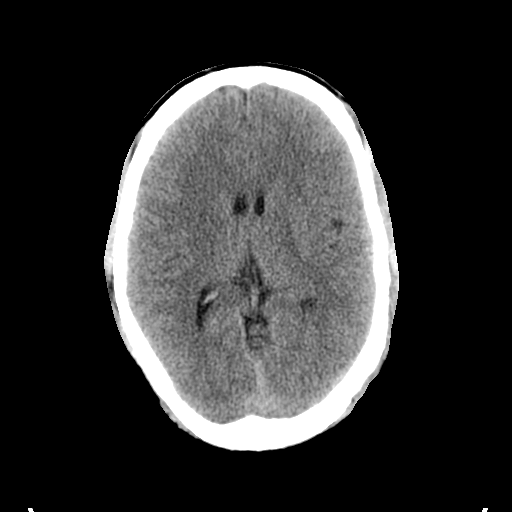
[im 16/30  brain]
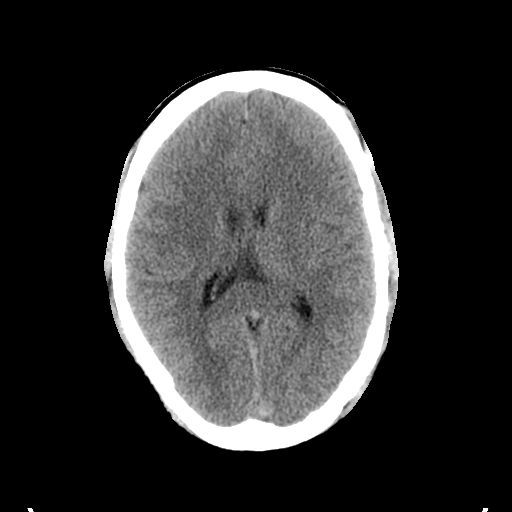
[im 16/30  bone]
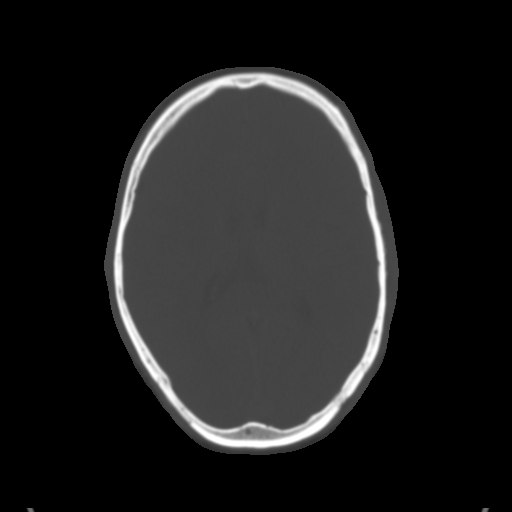
[im 18/30  brain]
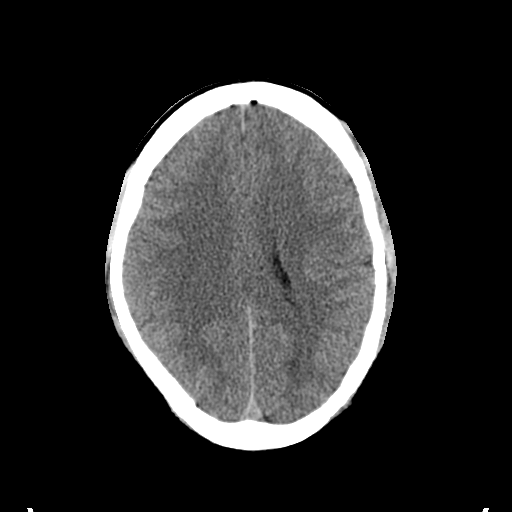
[im 20/30  brain]
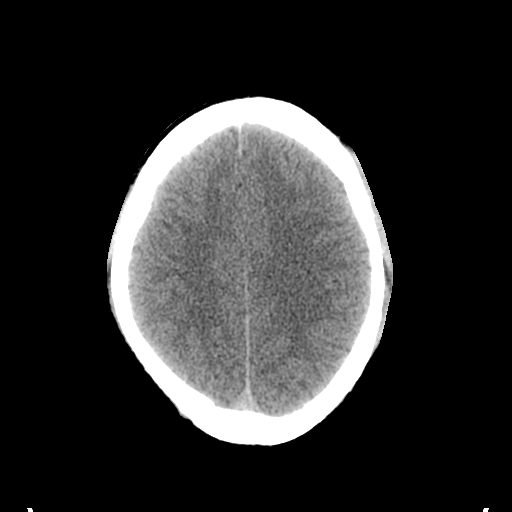
[im 22/30  brain]
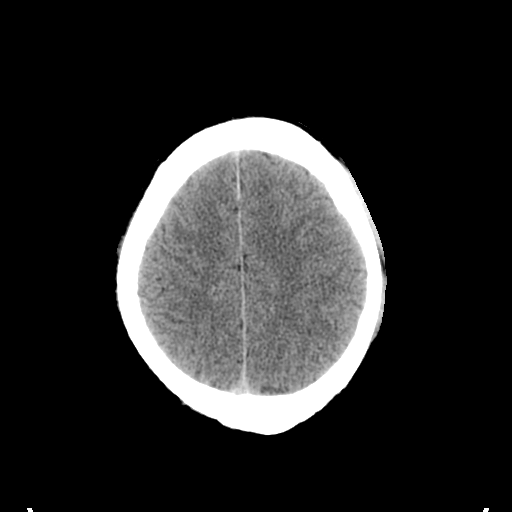
[im 23/30  brain]
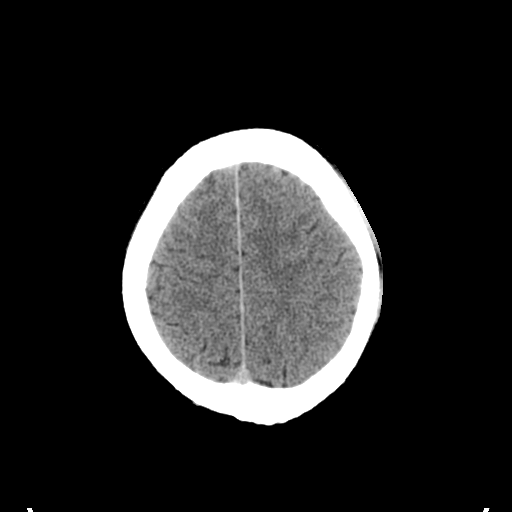
[im 23/30  bone]
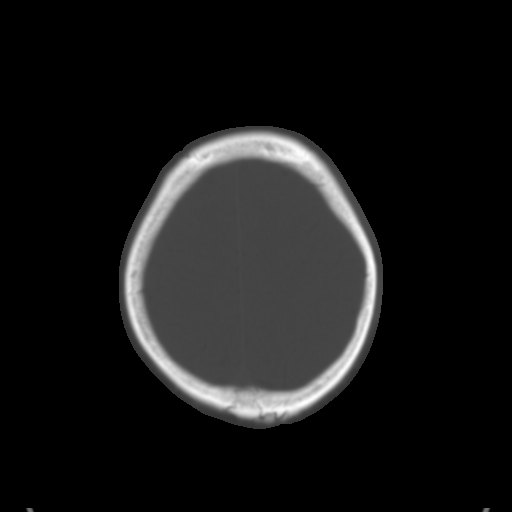
[im 25/30  brain]
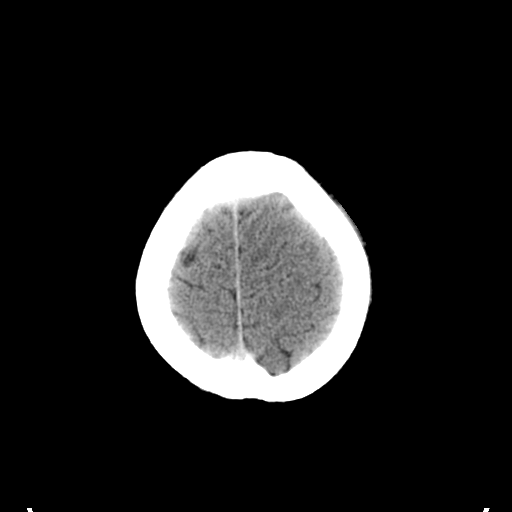
[im 27/30  brain]
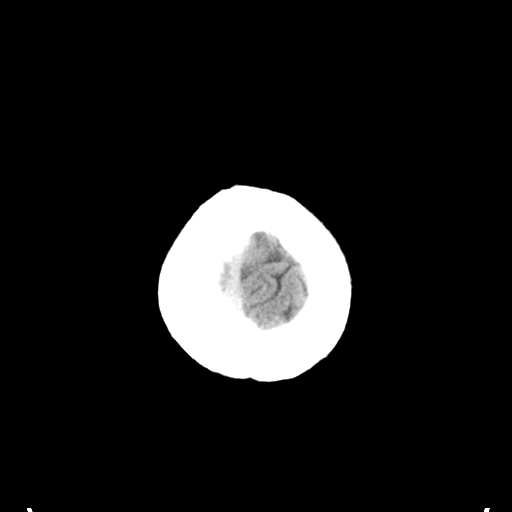
[im 29/30  brain]
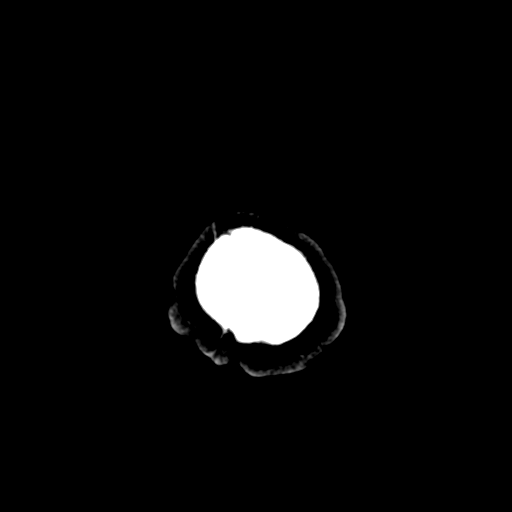

[16 of 30 positions shown; findings below may reference images not displayed]

FINDINGS: There is no evidence for acute hemorrhage, hydrocephalus,
mass lesion, or abnormal extra-axial fluid collection.  No definite
CT evidence of acute infarct.

Bone windows show the visualized portions of the paranasal sinuses
to be clear.
IMPRESSION: Stable.  Normal exam.

## 2014-06-07 ENCOUNTER — Ambulatory Visit: Payer: Self-pay | Attending: Internal Medicine

## 2014-06-29 ENCOUNTER — Ambulatory Visit: Payer: Self-pay | Admitting: Family Medicine

## 2014-07-08 ENCOUNTER — Ambulatory Visit: Payer: Self-pay | Admitting: Family Medicine

## 2014-07-21 ENCOUNTER — Ambulatory Visit (INDEPENDENT_AMBULATORY_CARE_PROVIDER_SITE_OTHER): Payer: Self-pay | Admitting: Family Medicine

## 2014-07-21 VITALS — BP 139/82 | HR 78 | Temp 98.2°F | Resp 16 | Ht 71.0 in | Wt 202.0 lb

## 2014-07-21 DIAGNOSIS — M545 Low back pain, unspecified: Secondary | ICD-10-CM

## 2014-07-21 DIAGNOSIS — I10 Essential (primary) hypertension: Secondary | ICD-10-CM

## 2014-07-21 LAB — POCT URINALYSIS DIP (DEVICE)
Bilirubin Urine: NEGATIVE
Glucose, UA: NEGATIVE mg/dL
KETONES UR: NEGATIVE mg/dL
LEUKOCYTES UA: NEGATIVE
Nitrite: NEGATIVE
PH: 8 (ref 5.0–8.0)
PROTEIN: NEGATIVE mg/dL
SPECIFIC GRAVITY, URINE: 1.02 (ref 1.005–1.030)
UROBILINOGEN UA: 0.2 mg/dL (ref 0.0–1.0)

## 2014-07-21 LAB — BASIC METABOLIC PANEL
BUN: 12 mg/dL (ref 6–23)
CHLORIDE: 104 meq/L (ref 96–112)
CO2: 26 meq/L (ref 19–32)
Calcium: 8.7 mg/dL (ref 8.4–10.5)
Creat: 0.92 mg/dL (ref 0.50–1.35)
Glucose, Bld: 113 mg/dL — ABNORMAL HIGH (ref 70–99)
Potassium: 3.7 mEq/L (ref 3.5–5.3)
Sodium: 140 mEq/L (ref 135–145)

## 2014-07-21 LAB — LIPID PANEL
Cholesterol: 165 mg/dL (ref 0–200)
HDL: 40 mg/dL (ref >=40)
LDL CALC: 81 mg/dL (ref 0–99)
TRIGLYCERIDES: 219 mg/dL — AB (ref <150)
Total CHOL/HDL Ratio: 4.1 Ratio
VLDL: 44 mg/dL — AB (ref 0–40)

## 2014-07-21 MED ORDER — LISINOPRIL-HYDROCHLOROTHIAZIDE 20-25 MG PO TABS: 1.0000 | ORAL_TABLET | Freq: Every day | ORAL | Status: DC

## 2014-07-21 NOTE — Progress Notes (Signed)
Subjective:    Patient ID: Kevin Little, male    DOB: 01/08/1971, 44 y.o.   MRN: [id number]  HPI  Mr. Kevin GurneyFrancois Bangs, a 44 year old male with a history of hypertension is here for follow-up of elevated blood pressure. He is  exercising and is adherent to low salt diet.  Mr. Daryel GeraldKouame does not check blood pressure at home. Patient denies dizziness chest pain, dyspnea, fatigue, irregular heart beat, orthopnea, palpitations, syncope and tachypnea.  Cardiovascular risk factors include: dyslipidemia, family history of premature cardiovascular disease and hypertension.  Past Medical History  Diagnosis Date  . Herpes simplex   . Herpes   . Hypertension   . Sinus problem    History   Social History  . Marital Status: Married    Spouse Name: N/A  . Number of Children: N/A  . Years of Education: N/A   Occupational History  . Not on file.   Social History Main Topics  . Smoking status: Never Smoker   . Smokeless tobacco: Not on file  . Alcohol Use: No  . Drug Use: No  . Sexual Activity: Not on file   Other Topics Concern  . Not on file   Social History Narrative   ** Merged History Encounter **       Review of Systems  Constitutional: Negative.   HENT: Negative.   Eyes: Negative.   Respiratory: Negative.  Negative for cough, shortness of breath, wheezing and stridor.   Cardiovascular: Negative.   Gastrointestinal: Negative.   Endocrine: Negative.   Genitourinary: Negative.   Musculoskeletal: Negative.   Skin: Negative.   Allergic/Immunologic: Negative.   Neurological: Negative.   Hematological: Negative.   Psychiatric/Behavioral: Negative.        Objective:   Physical Exam  Constitutional: He is oriented to person, place, and time. He appears well-developed and well-nourished.  HENT:  Head: Normocephalic and atraumatic.  Right Ear: External ear normal.  Left Ear: External ear normal.  Mouth/Throat: Oropharynx is clear and moist.  Eyes: Conjunctivae and EOM are  normal. Pupils are equal, round, and reactive to light.  Neck: Normal range of motion. Neck supple.  Cardiovascular: Normal rate, regular rhythm, normal heart sounds and intact distal pulses.   Pulmonary/Chest: Effort normal and breath sounds normal.  Abdominal: Soft. Bowel sounds are normal.  Musculoskeletal: Normal range of motion.  Neurological: He is alert and oriented to person, place, and time. He has normal reflexes.  Skin: Skin is warm and dry.  Psychiatric: He has a normal mood and affect. His behavior is normal. Judgment and thought content normal.         BP 139/82 mmHg  Pulse 78  Temp(Src) 98.2 F (36.8 C) (Oral)  Resp 16  Ht 5\' 11"  (1.803 m)  Wt 202 lb (91.627 kg)  BMI 28.19 kg/m2 Assessment & Plan:  1. Essential hypertension Mr. Daryel GeraldKouame is currently at goal on current medication regimen. Will continue current medication regimen. Will check for proteinuria, BUN, and creatinine on current medication regimen. Will follow up in 3 months.  - lisinopril-hydrochlorothiazide (PRINZIDE,ZESTORETIC) 20-25 MG per tablet; Take 1 tablet by mouth daily.  Dispense: 90 tablet; Refill: 0  - Lipid Panel - Basic Metabolic Panel - POCT urinalysis dipstick  2. Left-sided low back pain without sciatica Patient reports periodic back pain. Recommend OTC Tylenol 500 mg every 6 hours for pain. Also, apply warm, most compresses to back for 20 minutes 4 times per day as needed.    Lavi Sheehan M,  FNP 

## 2014-07-21 NOTE — Patient Instructions (Signed)
Back Exercises Back exercises help treat and prevent back injuries. The goal of back exercises is to increase the strength of your abdominal and back muscles and the flexibility of your back. These exercises should be started when you no longer have back pain. Back exercises include:  Pelvic Tilt. Lie on your back with your knees bent. Tilt your pelvis until the lower part of your back is against the floor. Hold this position 5 to 10 sec and repeat 5 to 10 times.  Knee to Chest. Pull first 1 knee up against your chest and hold for 20 to 30 seconds, repeat this with the other knee, and then both knees. This may be done with the other leg straight or bent, whichever feels better.  Sit-Ups or Curl-Ups. Bend your knees 90 degrees. Start with tilting your pelvis, and do a partial, slow sit-up, lifting your trunk only 30 to 45 degrees off the floor. Take at least 2 to 3 seconds for each sit-up. Do not do sit-ups with your knees out straight. If partial sit-ups are difficult, simply do the above but with only tightening your abdominal muscles and holding it as directed.  Hip-Lift. Lie on your back with your knees flexed 90 degrees. Push down with your feet and shoulders as you raise your hips a couple inches off the floor; hold for 10 seconds, repeat 5 to 10 times.  Back arches. Lie on your stomach, propping yourself up on bent elbows. Slowly press on your hands, causing an arch in your low back. Repeat 3 to 5 times. Any initial stiffness and discomfort should lessen with repetition over time.  Shoulder-Lifts. Lie face down with arms beside your body. Keep hips and torso pressed to floor as you slowly lift your head and shoulders off the floor. Do not overdo your exercises, especially in the beginning. Exercises may cause you some mild back discomfort which lasts for a few minutes; however, if the pain is more severe, or lasts for more than 15 minutes, do not continue exercises until you see your caregiver.  Improvement with exercise therapy for back problems is slow.  See your caregivers for assistance with developing a proper back exercise program. Document Released: 04/12/2004 Document Revised: 05/28/2011 Document Reviewed: 01/04/2011 [facility] Patient Information 2015 [place], [place]. This information is not intended to replace advice given to you by your health care provider. Make sure you discuss any questions you have with your health care provider. Back Pain, Adult Low back pain is very common. About 1 in 5 people have back pain.The cause of low back pain is rarely dangerous. The pain often gets better over time.About half of people with a sudden onset of back pain feel better in just 2 weeks. About 8 in 10 people feel better by 6 weeks.  CAUSES Some common causes of back pain include:  Strain of the muscles or ligaments supporting the spine.  Wear and tear (degeneration) of the spinal discs.  Arthritis.  Direct injury to the back. DIAGNOSIS Most of the time, the direct cause of low back pain is not known.However, back pain can be treated effectively even when the exact cause of the pain is unknown.Answering your caregiver's questions about your overall health and symptoms is one of the most accurate ways to make sure the cause of your pain is not dangerous. If your caregiver needs more information, he or she may order lab work or imaging tests (X-rays or MRIs).However, even if imaging tests show changes in your back,  this usually does not require surgery. HOME CARE INSTRUCTIONS For many people, back pain returns.Since low back pain is rarely dangerous, it is often a condition that people can learn to [facility] their own.   Remain active. It is stressful on the back to sit or stand in one place. Do not sit, drive, or stand in one place for more than 30 minutes at a time. Take short walks on level surfaces as soon as pain allows.Try to increase the length of time you walk each  day.  Do not stay in bed.Resting more than 1 or 2 days can delay your recovery.  Do not avoid exercise or work.Your body is made to move.It is not dangerous to be active, even though your back may hurt.Your back will likely heal faster if you return to being active before your pain is gone.  Pay attention to your body when you bend and lift. Many people have less discomfortwhen lifting if they bend their knees, keep the load close to their bodies,and avoid twisting. Often, the most comfortable positions are those that put less stress on your recovering back.  Find a comfortable position to sleep. Use a firm mattress and lie on your side with your knees slightly bent. If you lie on your back, put a pillow under your knees.  Only take over-the-counter or prescription medicines as directed by your caregiver. Over-the-counter medicines to reduce pain and inflammation are often the most helpful.Your caregiver may prescribe muscle relaxant drugs.These medicines help dull your pain so you can more quickly return to your normal activities and healthy exercise.  Put ice on the injured area.  Put ice in a plastic bag.  Place a towel between your skin and the bag.  Leave the ice on for 15-20 minutes, 03-04 times a day for the first 2 to 3 days. After that, ice and heat may be alternated to reduce pain and spasms.  Ask your caregiver about trying back exercises and gentle massage. This may be of some benefit.  Avoid feeling anxious or stressed.Stress increases muscle tension and can worsen back pain.It is important to recognize when you are anxious or stressed and learn ways to manage it.Exercise is a great option. SEEK MEDICAL CARE IF:  You have pain that is not relieved with rest or medicine.  You have pain that does not improve in 1 week.  You have new symptoms.  You are generally not feeling well. SEEK IMMEDIATE MEDICAL CARE IF:   You have pain that radiates from your back into  your legs.  You develop new bowel or bladder control problems.  You have unusual weakness or numbness in your arms or legs.  You develop nausea or vomiting.  You develop abdominal pain.  You feel faint. Document Released: 03/05/2005 Document Revised: 09/04/2011 Document Reviewed: 07/07/2013 [facility] Patient Information 2015 [place], [place]. This information is not intended to replace advice given to you by your health care provider. Make sure you discuss any questions you have with your health care provider. Back Pain, Adult Low back pain is very common. About 1 in 5 people have back pain.The cause of low back pain is rarely dangerous. The pain often gets better over time.About half of people with a sudden onset of back pain feel better in just 2 weeks. About 8 in 10 people feel better by 6 weeks.  CAUSES Some common causes of back pain include:  Strain of the muscles or ligaments supporting the spine.  Wear and tear (degeneration)  of the spinal discs.  Arthritis.  Direct injury to the back. DIAGNOSIS Most of the time, the direct cause of low back pain is not known.However, back pain can be treated effectively even when the exact cause of the pain is unknown.Answering your caregiver's questions about your overall health and symptoms is one of the most accurate ways to make sure the cause of your pain is not dangerous. If your caregiver needs more information, he or she may order lab work or imaging tests (X-rays or MRIs).However, even if imaging tests show changes in your back, this usually does not require surgery. HOME CARE INSTRUCTIONS For many people, back pain returns.Since low back pain is rarely dangerous, it is often a condition that people can learn to [facility] their own.   Remain active. It is stressful on the back to sit or stand in one place. Do not sit, drive, or stand in one place for more than 30 minutes at a time. Take short walks on level surfaces as soon as pain  allows.Try to increase the length of time you walk each day.  Do not stay in bed.Resting more than 1 or 2 days can delay your recovery.  Do not avoid exercise or work.Your body is made to move.It is not dangerous to be active, even though your back may hurt.Your back will likely heal faster if you return to being active before your pain is gone.  Pay attention to your body when you bend and lift. Many people have less discomfortwhen lifting if they bend their knees, keep the load close to their bodies,and avoid twisting. Often, the most comfortable positions are those that put less stress on your recovering back.  Find a comfortable position to sleep. Use a firm mattress and lie on your side with your knees slightly bent. If you lie on your back, put a pillow under your knees.  Only take over-the-counter or prescription medicines as directed by your caregiver. Over-the-counter medicines to reduce pain and inflammation are often the most helpful.Your caregiver may prescribe muscle relaxant drugs.These medicines help dull your pain so you can more quickly return to your normal activities and healthy exercise.  Put ice on the injured area.  Put ice in a plastic bag.  Place a towel between your skin and the bag.  Leave the ice on for 15-20 minutes, 03-04 times a day for the first 2 to 3 days. After that, ice and heat may be alternated to reduce pain and spasms.  Ask your caregiver about trying back exercises and gentle massage. This may be of some benefit.  Avoid feeling anxious or stressed.Stress increases muscle tension and can worsen back pain.It is important to recognize when you are anxious or stressed and learn ways to manage it.Exercise is a great option. SEEK MEDICAL CARE IF:  You have pain that is not relieved with rest or medicine.  You have pain that does not improve in 1 week.  You have new symptoms.  You are generally not feeling well. SEEK IMMEDIATE MEDICAL CARE  IF:   You have pain that radiates from your back into your legs.  You develop new bowel or bladder control problems.  You have unusual weakness or numbness in your arms or legs.  You develop nausea or vomiting.  You develop abdominal pain.  You feel faint. Document Released: 03/05/2005 Document Revised: 09/04/2011 Document Reviewed: 07/07/2013 [facility] Patient Information 2015 [place]. This information is not intended to replace advice given to you by your  health care provider. Make sure you discuss any questions you have with your health care provider. DASH Eating Plan DASH stands for "Dietary Approaches to Stop Hypertension." The DASH eating plan is a healthy eating plan that has been shown to reduce high blood pressure (hypertension). Additional health benefits may include reducing the risk of type 2 diabetes mellitus, heart disease, and stroke. The DASH eating plan may also help with weight loss. WHAT DO I NEED TO KNOW ABOUT THE DASH EATING PLAN? For the DASH eating plan, you will follow these general guidelines:  Choose foods with a percent daily value for sodium of less than 5% (as listed on the food label).  Use salt-free seasonings or herbs instead of table salt or sea salt.  Check with your health care provider or pharmacist before using salt substitutes.  Eat lower-sodium products, often labeled as "lower sodium" or "no salt added."  Eat fresh foods.  Eat more vegetables, fruits, and low-fat dairy products.  Choose whole grains. Look for the word "whole" as the first word in the ingredient list.  Choose fish and skinless chicken or [place] more often than red meat. Limit fish, poultry, and meat to 6 oz (170 g) each day.  Limit sweets, desserts, sugars, and sugary drinks.  Choose heart-healthy fats.  Limit cheese to 1 oz (28 g) per day.  Eat more home-cooked food and less restaurant, buffet, and fast food.  Limit fried foods.  Cook foods using methods  other than frying.  Limit canned vegetables. If you do use them, rinse them well to decrease the sodium.  When eating at a restaurant, ask that your food be prepared with less salt, or no salt if possible. WHAT FOODS CAN I EAT? Seek help from a dietitian for individual calorie needs. Grains Whole grain or whole wheat bread. Brown rice. Whole grain or whole wheat pasta. Quinoa, bulgur, and whole grain cereals. Low-sodium cereals. Corn or whole wheat flour tortillas. Whole grain cornbread. Whole grain crackers. Low-sodium crackers. Vegetables Fresh or frozen vegetables (raw, steamed, roasted, or grilled). Low-sodium or reduced-sodium tomato and vegetable juices. Low-sodium or reduced-sodium tomato sauce and paste. Low-sodium or reduced-sodium canned vegetables.  Fruits All fresh, canned (in natural juice), or frozen fruits. Meat and Other Protein Products Ground beef (85% or leaner), grass-fed beef, or beef trimmed of fat. Skinless chicken or [place]. Ground chicken or [place]. Pork trimmed of fat. All fish and seafood. Eggs. Dried beans, peas, or lentils. Unsalted nuts and seeds. Unsalted canned beans. Dairy Low-fat dairy products, such as skim or 1% milk, 2% or reduced-fat cheeses, low-fat ricotta or cottage cheese, or plain low-fat yogurt. Low-sodium or reduced-sodium cheeses. Fats and Oils Tub margarines without trans fats. Light or reduced-fat mayonnaise and salad dressings (reduced sodium). Avocado. Safflower, olive, or canola oils. Natural peanut or almond butter. Other Unsalted popcorn and pretzels. The items listed above may not be a complete list of recommended foods or beverages. Contact your dietitian for more options. WHAT FOODS ARE NOT RECOMMENDED? Grains White bread. White pasta. White rice. Refined cornbread. Bagels and croissants. Crackers that contain trans fat. Vegetables Creamed or fried vegetables. Vegetables in a cheese sauce. Regular canned vegetables. Regular canned  tomato sauce and paste. Regular tomato and vegetable juices. Fruits Dried fruits. Canned fruit in light or heavy syrup. Fruit juice. Meat and Other Protein Products Fatty cuts of meat. Ribs, chicken wings, bacon, sausage, bologna, salami, chitterlings, fatback, hot dogs, bratwurst, and packaged luncheon meats. Salted nuts and seeds. Canned beans  with salt. Dairy Whole or 2% milk, cream, half-and-half, and cream cheese. Whole-fat or sweetened yogurt. Full-fat cheeses or blue cheese. Nondairy creamers and whipped toppings. Processed cheese, cheese spreads, or cheese curds. Condiments Onion and garlic salt, seasoned salt, table salt, and sea salt. Canned and packaged gravies. Worcestershire sauce. Tartar sauce. Barbecue sauce. Teriyaki sauce. Soy sauce, including reduced sodium. Steak sauce. Fish sauce. Oyster sauce. Cocktail sauce. Horseradish. Ketchup and mustard. Meat flavorings and tenderizers. Bouillon cubes. Hot sauce. Tabasco sauce. Marinades. Taco seasonings. Relishes. Fats and Oils Butter, stick margarine, lard, shortening, ghee, and bacon fat. Coconut, palm kernel, or palm oils. Regular salad dressings. Other Pickles and olives. Salted popcorn and pretzels. The items listed above may not be a complete list of foods and beverages to avoid. Contact your dietitian for more information. WHERE CAN I FIND MORE INFORMATION? National [employer], [employer], [employer] Institute: [employer] Document Released: 02/22/2011 Document Revised: 07/20/2013 Document Reviewed: 01/07/2013 [facility] Patient Information 2015 [place], [place]. This information is not intended to replace advice given to you by your health care provider. Make sure you discuss any questions you have with your health care provider.

## 2014-07-23 ENCOUNTER — Encounter: Payer: Self-pay | Admitting: Family Medicine

## 2014-09-13 ENCOUNTER — Encounter (HOSPITAL_COMMUNITY): Payer: Self-pay | Admitting: Family Medicine

## 2014-09-13 ENCOUNTER — Emergency Department (HOSPITAL_COMMUNITY)
Admission: EM | Admit: 2014-09-13 | Discharge: 2014-09-13 | Disposition: A | Discharge status: Home / Self Care | Payer: No Typology Code available for payment source | Attending: Emergency Medicine | Admitting: Emergency Medicine

## 2014-09-13 DIAGNOSIS — I10 Essential (primary) hypertension: Secondary | ICD-10-CM | POA: Insufficient documentation

## 2014-09-13 DIAGNOSIS — Z7982 Long term (current) use of aspirin: Secondary | ICD-10-CM | POA: Insufficient documentation

## 2014-09-13 DIAGNOSIS — Z79899 Other long term (current) drug therapy: Secondary | ICD-10-CM | POA: Insufficient documentation

## 2014-09-13 DIAGNOSIS — M545 Low back pain, unspecified: Secondary | ICD-10-CM

## 2014-09-13 DIAGNOSIS — Z8619 Personal history of other infectious and parasitic diseases: Secondary | ICD-10-CM | POA: Insufficient documentation

## 2014-09-13 DIAGNOSIS — G8929 Other chronic pain: Secondary | ICD-10-CM | POA: Insufficient documentation

## 2014-09-13 MED ORDER — MELOXICAM 15 MG PO TABS: 15.0000 mg | ORAL_TABLET | Freq: Every day | ORAL | Status: DC

## 2014-09-13 MED ORDER — METHOCARBAMOL 500 MG PO TABS: 500.0000 mg | ORAL_TABLET | Freq: Two times a day (BID) | ORAL | Status: DC

## 2014-09-13 NOTE — Discharge Instructions (Signed)
1. Medications: robaxin, mobic usual home medications 2. Treatment: rest, drink plenty of fluids, gentle stretching as discussed, alternate ice and heat 3. Follow Up: Please followup with your primary doctor in 3 days for discussion of your diagnoses and further evaluation after today's visit; if you do not have a primary care doctor use the resource guide provided to find one;  Return to the ER for worsening back pain, difficulty walking, loss of bowel or bladder control or other concerning symptoms    Back Exercises Back exercises help treat and prevent back injuries. The goal of back exercises is to increase the strength of your abdominal and back muscles and the flexibility of your back. These exercises should be started when you no longer have back pain. Back exercises include:  Pelvic Tilt. Lie on your back with your knees bent. Tilt your pelvis until the lower part of your back is against the floor. Hold this position 5 to 10 sec and repeat 5 to 10 times.  Knee to Chest. Pull first 1 knee up against your chest and hold for 20 to 30 seconds, repeat this with the other knee, and then both knees. This may be done with the other leg straight or bent, whichever feels better.  Sit-Ups or Curl-Ups. Bend your knees 90 degrees. Start with tilting your pelvis, and do a partial, slow sit-up, lifting your trunk only 30 to 45 degrees off the floor. Take at least 2 to 3 seconds for each sit-up. Do not do sit-ups with your knees out straight. If partial sit-ups are difficult, simply do the above but with only tightening your abdominal muscles and holding it as directed.  Hip-Lift. Lie on your back with your knees flexed 90 degrees. Push down with your feet and shoulders as you raise your hips a couple inches off the floor; hold for 10 seconds, repeat 5 to 10 times.  Back arches. Lie on your stomach, propping yourself up on bent elbows. Slowly press on your hands, causing an arch in your low back. Repeat 3  to 5 times. Any initial stiffness and discomfort should lessen with repetition over time.  Shoulder-Lifts. Lie face down with arms beside your body. Keep hips and torso pressed to floor as you slowly lift your head and shoulders off the floor. Do not overdo your exercises, especially in the beginning. Exercises may cause you some mild back discomfort which lasts for a few minutes; however, if the pain is more severe, or lasts for more than 15 minutes, do not continue exercises until you see your caregiver. Improvement with exercise therapy for back problems is slow.  See your caregivers for assistance with developing a proper back exercise program. Document Released: 04/12/2004 Document Revised: 05/28/2011 Document Reviewed: 01/04/2011 [facility] Patient Information 2015 [place], [place]. This information is not intended to replace advice given to you by your health care provider. Make sure you discuss any questions you have with your health care provider.    Emergency Department Resource Guide 1) Find a Doctor and Pay Out of Pocket Although you won't have to find out who is covered by your insurance plan, it is a good idea to ask around and get recommendations. You will then need to call the office and see if the doctor you have chosen will accept you as a new patient and what types of options they offer for patients who are self-pay. Some doctors offer discounts or will set up payment plans for their patients who do not have insurance,  but you will need to ask so you aren't surprised when you get to your appointment.  2) Contact Your Local Health Department Not all health departments have doctors that can see patients for sick visits, but many do, so it is worth a call to see if yours does. If you don't know where your local health department is, you can check in your phone book. The CDC also has a tool to help you locate your state's health department, and many state websites also have listings of all  of their local health departments.  3) Find a Walk-in Clinic If your illness is not likely to be very severe or complicated, you may want to try a walk in clinic. These are popping up all over the country in pharmacies, drugstores, and shopping centers. They're usually staffed by nurse practitioners or physician assistants that have been trained to treat common illnesses and complaints. They're usually fairly quick and inexpensive. However, if you have serious medical issues or chronic medical problems, these are probably not your best option.  No Primary Care Doctor: - Call Health Connect at  [phone] - they can help you locate a primary care doctor that  accepts your insurance, provides certain services, etc. - Physician Referral Service- [phone]  Chronic Pain Problems: Organization         Address  Phone   Notes  [facility]  [phone] Patients need to be referred by their primary care doctor.   Medication Assistance: Organization         Address  Phone   Notes  [facility] Medication [facility] [address] [place] [place], [place] [address] [phone] --Must be a resident of [facility] -- Must have NO insurance coverage whatsoever (no Medicaid/ Medicare, etc.) -- The pt. MUST have a primary care doctor that directs their care regularly and follows them in the community   [facility]ist  [phone]   [employer]  [phone]    Agencies that provide inexpensive medical care: Organization         Address  Phone   Notes  [facility]  [phone]   [facility]    [phone]   [facility] [address] [place], [place] [address] [phone]   [facility] [address] [place] [place] [phone]   Planned Parenthood    [phone]   [facility]    [phone]   [facility]  [address],  [place] Phone:  [phone], Fax:  [phone] Hours of Operation:  9 am - 6 pm, M-F.  Also accepts Medicaid/Medicare and self-pay.  [facility]  [address], [place] Phone: [phone], Fax: [phone]. Hours of Operation:  8:30 am - 5:30 pm, M-F.  Also accepts Medicaid and self-pay.  [facility] [place] [address] [place] Phone: [phone]   [facility] [address] Natasha Bence [place], [place] [phone], Ext. [phone] Mondays & Thursdays: 7-9 AM.  First 15 patients are seen on a first come, first serve basis.    Medicaid-accepting [facility] Providers:  Organization         Address  Phone   Notes  [facility] [address], [place] [phone] Also accepts self-pay patients.  [facility] [address] [place],  [address], [place]  [phone]   [place], [address], [place] [phone]   [place] [address], [place] [phone]   Lucianne Lei [address], [place]   [phone] Only accepts [place] Access [place] patients after they have their name applied to their card.   Self-Pay (no insurance) in [facility]:  Organization         Address  Phone   Notes  Sickle Cell Patients, [facility] [place] [phone]   [facility] [place] [phone]   [facility] [place]  [place], [place], [place] [phone]   [facility]/Dr. Osei-Bonsu  [address], [place] or [address], [place] [phone] Phone number for both [place] and [place] locations is the same.  [facility] [address] [place] [phone]   [facility] [address], [place] or [address] [phone]   [facility] [address], [place] [phone], phone; [phone], fax Sees patients 1st and 3rd Saturday of every month.  Must not qualify for public or private insurance (i.e. Medicaid, Medicare, [place] Health Choice, Veterans' Benefits)  Household income should be no more than 200% of the poverty level The clinic cannot treat you if you are pregnant or think you are pregnant  Sexually transmitted diseases are not treated at the clinic.    Dental Care: Organization         Address  Phone  Notes  [facility] [place] [phone] Accepts children up to age 51 who are enrolled in [place] or [place]; pregnant women with a Medicaid card; and children who have applied for Medicaid or [place] Health Choice, but were declined, whose parents can pay a reduced fee at time of service.  [facility] Department of [facility]  [address], [place] [phone] Accepts children up to age 54 who are enrolled in [place] or [place]; pregnant women with a Medicaid card; and children who have applied for Medicaid or [place] Health Choice, but were declined, whose parents can pay a reduced fee at time of service.  [facility] PRO[facility]  [place] [phone] Patients are seen by appointment only. Walk-ins are not accepted. [place] will see patients 62 years of age and older. Monday - Tuesday (8am-5pm) Most Wednesdays (8:30-5pm) $30 per visit, cash only  [facility] Adult Dental Access PROGRAM  [address], [facility] [phone] Patients are seen by appointment only. Walk-ins are not accepted. [place] will see patients 36 years of age and older. One Wednesday Evening (Monthly: Volunteer Based).  $30 per visit, cash only  [place]   [phone] for adults; Children under age 59, call Graduate Pediatric Dentistry at [phone]. Children aged 49-14, please call [phone] to request a pediatric application.  Dental services are provided in all areas of dental care including fillings, crowns and bridges, complete and partial dentures, implants, gum treatment, root canals, and extractions. Preventive care is also provided. Treatment is provided to both adults and children. Patients are selected via a  lottery and there is often a waiting list.   [facility] [address], [place]  [phone] [email or web address]   [facility] [address] [place], [place] [phone], Ext. [phone] Second and Fourth Thursday of each month, opens at 6:30 AM; Clinic ends at 9 AM.  Patients are seen on a first-come first-served basis, and a limited number are seen during each clinic.   [facility]  [address] Hillard Danker [place], [place] [phone]   Eligibility Requirements You must have lived in [place], [place], or [place] counties for at least the last three months.   You cannot be eligible for state or federal sponsored [employer], including [employer], [place], or [employer].   You generally cannot be eligible for healthcare insurance through your employer.    How to apply: Eligibility screenings are held every Tuesday and Wednesday afternoon from 1:00 pm until 4:00 pm. You do not need an appointment for the interview!  [facility] [address] [place], [place]   [place]  [place] Department  [place]  [phone]    Behavioral Health Resources in the Community: Intensive Outpatient Programs Organization         Address  Phone  Notes  [place]. [address] [place], [place] [phone]   [facility] [address] [place], [place]   ADS: [facility] [address] [place], [place]   [place] [address],  [place], [place] or [phone]   Substance Abuse Resources Organization         Address  Phone  Notes  Alcohol and Drug Services  [phone]   [place]  [phone]   [facility]   Chinita Pester  [phone]   Residential & Outpatient Substance Abuse Program  [phone]   Psychological Services Organization         Address  Phone  Notes  [facility]  [place]  [phone]   [place] [address] [place] or [phone]    Mobile Crisis Teams Organization         Address  Phone  Notes  [facility], Mobile Crisis Care Unit  [phone]   [facility]  [address] [place], [place]   [place] [address], [place] [phone]    Self-Help/Support Groups Organization         Address  Phone             Notes  [place]. of [place] - variety of support groups  [place] Call for more information  Narcotics Anonymous (NA), [facility] [address], [employer] [place]  2 meetings at this location   Special educational needs teacher         Address  Phone  Notes  [facility]idential Treatment [place],    [place]  [phone]   [facility]  [address] [place] [phone], [place], [place]   [place], [place] [phone] Admissions: 8am-3pm M-F  Incentives Substance [place] [address]    [place], [place] [phone]   [facility] [place] [address], [place],  [place] [phone]   [facility] [place] [address],  [place], [place] [phone]     [facility] - Intensive Outpatient [address] Laurell [address], [place], [place] [phone]   [facility] ([facility] [address] [place].,  [place], [place] [phone] or [phone]   [facility] ([facility]) [address] [place], [place] [phone] Accepts Medicaid  [facility] [place] [address]  [place] [phone] Substance Abuse/Addiction Treatment   [facility] Organization         Address  Phone  Notes  [facility]  [phone]   Angie Fava, PhD [address] Ervin Knack [place], [place]   [phone] or [phone]   [facility]   [address] [place], [place] [phone]   [facility] [address] [place], [place] [phone] Insurance/Medicaid/sponsorship through [facility] and Families [address]                                    [place], [place] [phone] Therapy/tele-psych/case  [facility] [address], [place] [phone]    Dr. Lolly Mustache  [phone]   [facility] [place]  [employer] [facility] Dept. 1) [address] [place] 2) [address] [place] 3)  [address], [place] [phone]  [phone]   [facility] Child Abuse Hotline [phone] or [phone] (After Hours)      '

## 2014-09-13 NOTE — ED Notes (Signed)
Pt here for lower back pain. sts seen here a year ago for the same. sts lower back. sts he can do regular activity but it is painful.

## 2014-09-13 NOTE — ED Notes (Signed)
Pt states that he has had lower back pain for over a year. Pt states that pain is constant.

## 2014-09-13 NOTE — ED Provider Notes (Signed)
CSN: [id number]     Arrival date & time 09/13/14  1623 History  This chart was scribed for non-physician practitioner, Dierdre Forth, PA-C working with Pricilla Loveless, MD, by Jarvis Morgan, ED Scribe. This patient was seen in room TR07C/TR07C and the patient's care was started at 4:35 PM.    Chief Complaint  Patient presents with  . Back Pain    The history is provided by the patient and medical records. No language interpreter was used.    HPI Comments: Kevin Little is a 44 y.o. male with no PMHx who presents to the Emergency Department complaining of constant, moderate, "5/10", non radiating, low back pain for 1 year. He notes he is still able to do moderate activity but states it is painful for him. Pt reports he had a lumbar x-ray done 1 year ago which came back normal. He also reports a normal physical exam, blood work and urinalysis by his PCP several months ago without abnormality.  He denies falls, trauma or any new injury. He has been taking Tylenol with minimal relief but denies complete relief from medication. He denies following up with specialist or orthopedist for pain in the past. He denies numbness or weakness in lower extremities, gait issue, unexpected weight loss, urinary/bowel incontinence. He denies h/o cancer or IV drug use   Past Medical History  Diagnosis Date  . Herpes simplex   . Herpes   . Hypertension   . Sinus problem    History reviewed. No pertinent past surgical history. Family History  Problem Relation Age of Onset  . Hypertension Mother   . Cancer Father     prostate   History  Substance Use Topics  . Smoking status: Never Smoker   . Smokeless tobacco: Not on file  . Alcohol Use: No    Review of Systems  Constitutional: Negative for fever, chills, fatigue and unexpected weight change.  Respiratory: Negative for chest tightness and shortness of breath.   Cardiovascular: Negative for chest pain.  Gastrointestinal: Negative for nausea,  vomiting, abdominal pain and diarrhea.  Genitourinary: Negative for dysuria, urgency, frequency and hematuria.  Musculoskeletal: Positive for back pain. Negative for joint swelling, gait problem, neck pain and neck stiffness.  Skin: Negative for rash.  Neurological: Negative for weakness, light-headedness, numbness and headaches.  All other systems reviewed and are negative.     Allergies  Omeprazole and Rabeprazole sodium  Home Medications   Prior to Admission medications   Medication Sig Start Date End Date Taking? Authorizing Provider  acyclovir (ZOVIRAX) 200 MG capsule Take 200 mg by mouth daily.    Historical Provider, MD  aspirin-acetaminophen-caffeine (EXCEDRIN MIGRAINE) [id number] MG per tablet Take 1 tablet by mouth every 6 (six) hours as needed (migraine headache). Patient not taking: Reported on 07/21/2014 12/02/12   Oleh Genin, MD  diclofenac (VOLTAREN) 75 MG EC tablet Take 1 tablet (75 mg total) by mouth 2 (two) times daily. Patient not taking: Reported on 07/21/2014 09/30/13   [name], FNP  fluticasone [facility]) 50 MCG/ACT nasal spray Place 2 sprays into the nose daily. 04/16/11 04/15/12  Ruby Cola, PA-C  lisinopril-hydrochlorothiazide (PRINZIDE,ZESTORETIC) 20-25 MG per tablet Take 1 tablet by mouth daily. 07/21/14   [name], FNP  meloxicam (MOBIC) 15 MG tablet Take 1 tablet (15 mg total) by mouth daily. 09/13/14   Lynisha Osuch, PA-C  methocarbamol (ROBAXIN) 500 MG tablet Take 1 tablet (500 mg total) by mouth 2 (two) times daily. 09/13/14   Dahlia Client Batsheva Stevick, PA-C  Triage Vitals: BP 126/87 mmHg  Pulse 76  Temp(Src) 97.7 F (36.5 C) (Oral)  Resp 18  SpO2 100%  Physical Exam  Constitutional: He appears well-developed and well-nourished. No distress.  HENT:  Head: Normocephalic and atraumatic.  Mouth/Throat: Oropharynx is clear and moist. No oropharyngeal exudate.  Eyes: Conjunctivae are normal.  Neck: Normal range of motion. Neck  supple.  Full ROM without pain  Cardiovascular: Normal rate, regular rhythm, normal heart sounds and intact distal pulses.   No murmur heard. Pulmonary/Chest: Effort normal and breath sounds normal. No respiratory distress. He has no wheezes.  Abdominal: Soft. Bowel sounds are normal. He exhibits no distension. There is no tenderness.  Soft and nontender  Musculoskeletal:  Full range of motion of the T-spine and L-spine No tenderness to palpation of the spinous processes of the T-spine or L-spine No tenderness to palpation of the paraspinous muscles of the L-spine  Lymphadenopathy:    He has no cervical adenopathy.  Neurological: He is alert. He has normal reflexes.  Reflex Scores:      Bicep reflexes are 2+ on the right side and 2+ on the left side.      Brachioradialis reflexes are 2+ on the right side and 2+ on the left side.      Patellar reflexes are 2+ on the right side and 2+ on the left side.      Achilles reflexes are 2+ on the right side and 2+ on the left side. Speech is clear and goal oriented, follows commands Normal 5/5 strength in upper and lower extremities bilaterally including dorsiflexion and plantar flexion, strong and equal grip strength Sensation normal to light and sharp touch Moves extremities without ataxia, coordination intact Normal gait Normal balance No Clonus   Skin: Skin is warm and dry. No rash noted. He is not diaphoretic. No erythema.  Psychiatric: He has a normal mood and affect. His behavior is normal.  Nursing note and vitals reviewed.   ED Course  Procedures (including critical care time)  DIAGNOSTIC STUDIES: Oxygen Saturation is 100% on RA, normal by my interpretation.    COORDINATION OF CARE: 4:43 PM-Will prescribe pt with Mobic and Robaxin and provide him with referral to orthopedic specialists.  Pt advised of plan for treatment and pt agrees.    Labs Review Labs Reviewed - No data to display  Imaging Review No results found.    EKG Interpretation None      MDM   Final diagnoses:  Chronic low back pain  Bilateral low back pain without sciatica   [name] presents with low back pain for greater than 1 year.  Patient with back pain.  No neurological deficits and normal neuro exam.  Patient can walk without difficulty or increase in pain.  No loss of bowel or bladder control.  No concern for cauda equina.  No fever, night sweats, weight loss, h/o cancer, IVDU.  RICE protocol and muscle relaxer indicated and discussed with patient.  Pt is to follow-up with Ortho for further evaluation and treatment.     BP 126/87 mmHg  Pulse 76  Temp(Src) 97.7 F (36.5 C) (Oral)  Resp 18  SpO2 100%    Dierdre Forth, PA-C 09/13/14 1711  Pricilla Loveless, MD 09/14/14 1444

## 2014-10-25 ENCOUNTER — Encounter: Payer: Self-pay | Admitting: Family Medicine

## 2014-10-25 ENCOUNTER — Ambulatory Visit (INDEPENDENT_AMBULATORY_CARE_PROVIDER_SITE_OTHER): Payer: No Typology Code available for payment source | Admitting: Family Medicine

## 2014-10-25 VITALS — BP 135/83 | HR 65 | Temp 98.2°F | Resp 16 | Ht 71.0 in | Wt 181.0 lb

## 2014-10-25 DIAGNOSIS — I10 Essential (primary) hypertension: Secondary | ICD-10-CM

## 2014-10-25 MED ORDER — LISINOPRIL-HYDROCHLOROTHIAZIDE 20-25 MG PO TABS: 1.0000 | ORAL_TABLET | Freq: Every day | ORAL | Status: DC

## 2014-10-25 NOTE — Patient Instructions (Signed)
If you stay on your lisinopril/hctz come back in 6 months If you stop and start the medicine from your friend. Come back in 2 weeks after you make the change to check your BP. Continue your good dietary effort to lose a little more weight or maintain where you are.

## 2014-10-25 NOTE — Progress Notes (Signed)
Subjective:     Patient ID: Kevin Little, male   DOB: 1970/08/16, 44 y.o.   MRN: [id number]  HPI   Patient presents today for a follow-up of hypertension and low back pain. He also has a question about herpes. He has a history of herpes and has been on acyclovir daily in the past. He has not taken in a year and has not had an outbreak of herpes. I have explained that under those circumstances I would certainly no take daily. He is on lisinopril hctz 20/25 for his blood pressure and takes tylenol for his back pain  He reports the pain does not interfere with his activities and the tylenol does help. He has a doctor friend in [place] who has convinced him that he know of a herbal medicine that can cure his herpes, his hypertension and several others conditions. He plans to send him some of the medication to try;  Review of Systems   Denies fever, chills, loss of appetite, unexplained weight loss Denies issues with eyes, ears, nose or throat Denies chest pain, palpatation or  Swelling of his feet and legs Denies coughing, wheezing or shortness of breath Denies adb pain, nausea, vomiting, diarrhea or heartburn Admits to low back pain    Objective:   Physical Exam  Alert, oriented, appropriate, in no distress HEENT within normal limits Lungs are clear to auscultation HS are regular w/o m,g,r. Abd is soft, no organomegaly, masses or tenderness BP 136/83    Assessment Plan     Hypertension -Continue lisinopril/hctz 20/25 -No labs are indicated today. -I have advised him not to discontinue his medications that are working for an unproven medication. If he does so, he should return two weeks after the change to have a BP check.  Low back pain -continue tylenol as needed  History of herpes -I do not recommend restarting daily preventive therapy -Follow-up if there is an outbreak.   Follow-up in 6 months for recheck if continues his current medications.           Henrietta Hoover,  FNP-BC

## 2014-11-09 ENCOUNTER — Other Ambulatory Visit: Payer: Self-pay

## 2014-11-09 MED ORDER — METHOCARBAMOL 500 MG PO TABS: 500.0000 mg | ORAL_TABLET | Freq: Two times a day (BID) | ORAL | Status: DC

## 2014-12-13 ENCOUNTER — Ambulatory Visit: Payer: No Typology Code available for payment source | Attending: Internal Medicine

## 2014-12-14 ENCOUNTER — Other Ambulatory Visit: Payer: Self-pay | Admitting: Family Medicine

## 2014-12-14 ENCOUNTER — Encounter: Payer: Self-pay | Admitting: Family Medicine

## 2014-12-14 ENCOUNTER — Ambulatory Visit (INDEPENDENT_AMBULATORY_CARE_PROVIDER_SITE_OTHER): Payer: No Typology Code available for payment source | Admitting: Family Medicine

## 2014-12-14 VITALS — BP 139/92 | HR 73 | Temp 98.1°F | Resp 16 | Ht 71.0 in | Wt 189.0 lb

## 2014-12-14 DIAGNOSIS — R1012 Left upper quadrant pain: Secondary | ICD-10-CM

## 2014-12-14 DIAGNOSIS — R1011 Right upper quadrant pain: Secondary | ICD-10-CM

## 2014-12-14 DIAGNOSIS — I10 Essential (primary) hypertension: Secondary | ICD-10-CM

## 2014-12-14 LAB — COMPLETE METABOLIC PANEL WITH GFR
ALBUMIN: 4.3 g/dL (ref 3.6–5.1)
ALK PHOS: 79 U/L (ref 40–115)
ALT: 26 U/L (ref 9–46)
AST: 19 U/L (ref 10–40)
BILIRUBIN TOTAL: 0.6 mg/dL (ref 0.2–1.2)
BUN: 14 mg/dL (ref 7–25)
CALCIUM: 9.6 mg/dL (ref 8.6–10.3)
CO2: 29 mmol/L (ref 20–31)
Chloride: 100 mmol/L (ref 98–110)
Creat: 0.88 mg/dL (ref 0.60–1.35)
GLUCOSE: 90 mg/dL (ref 65–99)
Potassium: 3.9 mmol/L (ref 3.5–5.3)
Sodium: 138 mmol/L (ref 135–146)
TOTAL PROTEIN: 7.5 g/dL (ref 6.1–8.1)

## 2014-12-14 LAB — CBC WITH DIFFERENTIAL/PLATELET
BASOS PCT: 0 % (ref 0–1)
Basophils Absolute: 0 10*3/uL (ref 0.0–0.1)
EOS ABS: 0.1 10*3/uL (ref 0.0–0.7)
Eosinophils Relative: 3 % (ref 0–5)
HCT: 41.4 % (ref 39.0–52.0)
Hemoglobin: 14.3 g/dL (ref 13.0–17.0)
Lymphocytes Relative: 41 % (ref 12–46)
Lymphs Abs: 1.8 10*3/uL (ref 0.7–4.0)
MCH: 29.7 pg (ref 26.0–34.0)
MCHC: 34.5 g/dL (ref 30.0–36.0)
MCV: 86.1 fL (ref 78.0–100.0)
MONOS PCT: 17 % — AB (ref 3–12)
MPV: 10.9 fL (ref 8.6–12.4)
Monocytes Absolute: 0.8 10*3/uL (ref 0.1–1.0)
NEUTROS PCT: 39 % — AB (ref 43–77)
Neutro Abs: 1.8 10*3/uL (ref 1.7–7.7)
PLATELETS: 208 10*3/uL (ref 150–400)
RBC: 4.81 MIL/uL (ref 4.22–5.81)
RDW: 13.8 % (ref 11.5–15.5)
WBC: 4.5 10*3/uL (ref 4.0–10.5)

## 2014-12-14 LAB — POCT URINALYSIS DIP (DEVICE)
Bilirubin Urine: NEGATIVE
GLUCOSE, UA: NEGATIVE mg/dL
Hgb urine dipstick: NEGATIVE
KETONES UR: NEGATIVE mg/dL
Leukocytes, UA: NEGATIVE
Nitrite: NEGATIVE
PROTEIN: NEGATIVE mg/dL
Specific Gravity, Urine: 1.02 (ref 1.005–1.030)
UROBILINOGEN UA: 0.2 mg/dL (ref 0.0–1.0)
pH: 7.5 (ref 5.0–8.0)

## 2014-12-14 NOTE — Progress Notes (Signed)
Subjective:    Patient ID: Kevin Little, male    DOB: [birth year], 44 y.o.   MRN: [id number]  Abdominal Pain This is a recurrent problem. The current episode started 1 to 4 weeks ago. The onset quality is gradual. The problem occurs intermittently. The problem has been waxing and waning. The pain is located in the epigastric region. The pain is at a severity of 4/10. The pain is mild. The quality of the pain is aching. The abdominal pain does not radiate. Pertinent negatives include no anorexia, arthralgias, belching, constipation, diarrhea, dysuria, fever, flatus, frequency, headaches, hematochezia, hematuria, melena, myalgias, nausea, vomiting or weight loss. The pain is aggravated by certain positions. The pain is relieved by nothing. He has tried acetaminophen for the symptoms. The treatment provided no relief. His past medical history is significant for GERD. There is no history of abdominal surgery.   Kevin Little also has a history of hypertension.   He is  exercising and is adherent to low salt diet.  Kevin Little does not check blood pressure at home. Patient denies dizziness chest pain, dyspnea, fatigue, irregular heart beat, orthopnea, palpitations, syncope and tachypnea.  Cardiovascular risk factors include: dyslipidemia, family history of premature cardiovascular disease and hypertension.  Past Medical History  Diagnosis Date  . Herpes simplex   . Herpes   . Hypertension   . Sinus problem    Social History   Social History  . Marital Status: Married    Spouse Name: N/A  . Number of Children: N/A  . Years of Education: N/A   Occupational History  . Not on file.   Social History Main Topics  . Smoking status: Never Smoker   . Smokeless tobacco: Not on file  . Alcohol Use: No  . Drug Use: No  . Sexual Activity: Not on file   Other Topics Concern  . Not on file   Social History Narrative   ** Merged History Encounter **       Review of Systems  Constitutional:  Negative.  Negative for fever and weight loss.  HENT: Negative.   Eyes: Negative.   Respiratory: Negative.  Negative for cough, shortness of breath, wheezing and stridor.   Cardiovascular: Negative.   Gastrointestinal: Positive for abdominal pain. Negative for nausea, vomiting, diarrhea, constipation, melena, hematochezia, anorexia and flatus.  Endocrine: Negative.   Genitourinary: Negative.  Negative for dysuria, frequency and hematuria.  Musculoskeletal: Negative.  Negative for myalgias and arthralgias.  Skin: Negative.   Allergic/Immunologic: Negative.   Neurological: Negative.  Negative for headaches.  Hematological: Negative.   Psychiatric/Behavioral: Negative.        Objective:   Physical Exam  Constitutional: He is oriented to person, place, and time. He appears well-developed and well-nourished.  HENT:  Head: Normocephalic and atraumatic.  Right Ear: External ear normal.  Left Ear: External ear normal.  Mouth/Throat: Oropharynx is clear and moist.  Eyes: Conjunctivae and EOM are normal. Pupils are equal, round, and reactive to light.  Neck: Normal range of motion. Neck supple.  Cardiovascular: Normal rate, regular rhythm, normal heart sounds and intact distal pulses.   Pulmonary/Chest: Effort normal and breath sounds normal.  Abdominal: Soft. Bowel sounds are normal. There is no splenomegaly or hepatomegaly. There is tenderness in the epigastric area. There is no rebound and no CVA tenderness.  Musculoskeletal: Normal range of motion.       Thoracic back: He exhibits tenderness.  Neurological: He is alert and oriented to person, place, and time. He  has normal reflexes.  Skin: Skin is warm and dry.  Psychiatric: He has a normal mood and affect. His behavior is normal. Judgment and thought content normal.         BP 139/92 mmHg  Pulse 73  Temp(Src) 98.1 F (36.7 C) (Oral)  Resp 16  Ht  (1.803 m)  Wt 189 lb (85.73 kg)  BMI 26.37 kg/m2 Assessment & Plan:  1.  Acute bilateral upper abdominal pain - COMPLETE METABOLIC PANEL WITH GFR - CBC with Differential - POCT urinalysis dipstick  2. Essential hypertension Kevin Little is currently at goal on current medication regimen. Will continue current medication regimen. Will check for proteinuria, BUN, and creatinine on current medication regimen. Will follow up in 3 months.   RTC: Will follow up by phone when laboratory become available The patient was given clear instructions to go to ER or return to medical center if symptoms do not improve, worsen or new problems develop. The patient verbalized understanding. Will notify patient with laboratory results. [name], FNP

## 2014-12-15 ENCOUNTER — Telehealth: Payer: Self-pay | Admitting: Hematology

## 2014-12-15 NOTE — Telephone Encounter (Signed)
-----   Message from [name], [place] sent at 12/15/2014  3:51 PM EDT ----- Regarding: add on test Please call [facility] to add on sedimentation rate and c-reactive protein.   Thanks

## 2014-12-15 NOTE — Telephone Encounter (Signed)
Called and spoke with [facility] lab per noted listed below. Per representative, the sedimentation rate can not be added because it is past stability.  The C-reactive protein was added to the specimen.

## 2014-12-16 ENCOUNTER — Telehealth: Payer: Self-pay

## 2014-12-16 LAB — C-REACTIVE PROTEIN

## 2014-12-16 NOTE — Telephone Encounter (Signed)
-----   Message from [name], [place] sent at 12/16/2014  8:54 AM EDT ----- Regarding: lab results Please inform Mr. Kevin Little that lab test were normal. If he continues to have increased pain please call to schedule an appointment and I will send for imaging.

## 2014-12-16 NOTE — Telephone Encounter (Signed)
Patient advised of normal labs and to call if pain continues so we can schedule a follow up. Thanks!

## 2015-01-25 ENCOUNTER — Other Ambulatory Visit: Payer: Self-pay | Admitting: Hematology

## 2015-01-25 MED ORDER — ACYCLOVIR 200 MG PO CAPS: 200.0000 mg | ORAL_CAPSULE | Freq: Three times a day (TID) | ORAL | Status: DC

## 2015-01-25 NOTE — Telephone Encounter (Signed)
LOV 12/14/2014.  Patient states at this visit he advised he was not taking Valtrex.  Patient is now requesting the Valtrex be called in. / Please advise.  Thanks /

## 2015-03-25 MED FILL — LISINOPRIL-HCTZ 20-25 MG TA: 20-25 | 30 days supply | Qty: 30 | Fill #4

## 2015-04-21 ENCOUNTER — Ambulatory Visit (HOSPITAL_COMMUNITY)
Admission: RE | Admit: 2015-04-21 | Discharge: 2015-04-21 | Disposition: A | Discharge status: Home / Self Care | Payer: No Typology Code available for payment source | Source: Ambulatory Visit | Attending: Family Medicine | Admitting: Family Medicine

## 2015-04-21 ENCOUNTER — Encounter: Payer: Self-pay | Admitting: Family Medicine

## 2015-04-21 ENCOUNTER — Ambulatory Visit (INDEPENDENT_AMBULATORY_CARE_PROVIDER_SITE_OTHER): Payer: No Typology Code available for payment source | Admitting: Family Medicine

## 2015-04-21 ENCOUNTER — Ambulatory Visit: Payer: Self-pay | Admitting: Family Medicine

## 2015-04-21 VITALS — BP 127/85 | HR 67 | Temp 98.6°F | Resp 18 | Ht 71.0 in | Wt 198.0 lb

## 2015-04-21 DIAGNOSIS — M546 Pain in thoracic spine: Secondary | ICD-10-CM | POA: Insufficient documentation

## 2015-04-21 LAB — POCT URINALYSIS DIP (DEVICE)
BILIRUBIN URINE: NEGATIVE
GLUCOSE, UA: NEGATIVE mg/dL
KETONES UR: NEGATIVE mg/dL
Leukocytes, UA: NEGATIVE
Nitrite: NEGATIVE
Protein, ur: NEGATIVE mg/dL
SPECIFIC GRAVITY, URINE: 1.025 (ref 1.005–1.030)
Urobilinogen, UA: 0.2 mg/dL (ref 0.0–1.0)
pH: 6.5 (ref 5.0–8.0)
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
## 2015-04-21 NOTE — Patient Instructions (Signed)
Your urine shows no sign of infection Come back if symptoms do not continue to improve Go to [facility] for an x-ray. We will let you know the results.

## 2015-04-28 ENCOUNTER — Ambulatory Visit: Payer: Self-pay | Admitting: Family Medicine

## 2015-05-02 MED FILL — LISINOPRIL-HCTZ 20-25 MG TA: 20-25 | 30 days supply | Qty: 30 | Fill #5

## 2015-05-09 ENCOUNTER — Emergency Department (HOSPITAL_COMMUNITY)
Admission: EM | Admit: 2015-05-09 | Discharge: 2015-05-09 | Disposition: A | Discharge status: Home / Self Care | Payer: No Typology Code available for payment source | Attending: Emergency Medicine | Admitting: Emergency Medicine

## 2015-05-09 ENCOUNTER — Encounter (HOSPITAL_COMMUNITY): Payer: Self-pay | Admitting: Emergency Medicine

## 2015-05-09 ENCOUNTER — Ambulatory Visit (INDEPENDENT_AMBULATORY_CARE_PROVIDER_SITE_OTHER): Payer: No Typology Code available for payment source | Admitting: Family Medicine

## 2015-05-09 ENCOUNTER — Encounter: Payer: Self-pay | Admitting: Family Medicine

## 2015-05-09 VITALS — BP 132/77 | HR 77 | Temp 98.0°F | Ht 71.0 in | Wt 193.0 lb

## 2015-05-09 DIAGNOSIS — S0990XA Unspecified injury of head, initial encounter: Secondary | ICD-10-CM | POA: Insufficient documentation

## 2015-05-09 DIAGNOSIS — Z7951 Long term (current) use of inhaled steroids: Secondary | ICD-10-CM | POA: Insufficient documentation

## 2015-05-09 DIAGNOSIS — Y92410 Unspecified street and highway as the place of occurrence of the external cause: Secondary | ICD-10-CM | POA: Insufficient documentation

## 2015-05-09 DIAGNOSIS — Y999 Unspecified external cause status: Secondary | ICD-10-CM | POA: Insufficient documentation

## 2015-05-09 DIAGNOSIS — Z8709 Personal history of other diseases of the respiratory system: Secondary | ICD-10-CM | POA: Insufficient documentation

## 2015-05-09 DIAGNOSIS — I10 Essential (primary) hypertension: Secondary | ICD-10-CM | POA: Insufficient documentation

## 2015-05-09 DIAGNOSIS — Z23 Encounter for immunization: Secondary | ICD-10-CM

## 2015-05-09 DIAGNOSIS — M549 Dorsalgia, unspecified: Secondary | ICD-10-CM

## 2015-05-09 DIAGNOSIS — Y9389 Activity, other specified: Secondary | ICD-10-CM | POA: Insufficient documentation

## 2015-05-09 DIAGNOSIS — S3992XA Unspecified injury of lower back, initial encounter: Secondary | ICD-10-CM | POA: Insufficient documentation

## 2015-05-09 DIAGNOSIS — Z79899 Other long term (current) drug therapy: Secondary | ICD-10-CM | POA: Insufficient documentation

## 2015-05-09 DIAGNOSIS — Z8619 Personal history of other infectious and parasitic diseases: Secondary | ICD-10-CM | POA: Insufficient documentation

## 2015-05-09 MED ORDER — IBUPROFEN 800 MG PO TABS: 800.0000 mg | ORAL_TABLET | Freq: Three times a day (TID) | ORAL | Status: DC | PRN

## 2015-05-09 MED ORDER — METHOCARBAMOL 500 MG PO TABS: 500.0000 mg | ORAL_TABLET | Freq: Four times a day (QID) | ORAL | Status: DC | PRN
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
## 2015-05-09 NOTE — Progress Notes (Signed)
Patient ID: Kevin Little, male   DOB: May 25, 1970, 45 y.o.   MRN: [id number]   Kevin Little, is a 45 y.o. male  WGN:[id number]  HQI:[id number]  DOB - 1970/11/26  CC:  Chief Complaint  Patient presents with  . Follow-up    follow up on back pain and xray, blood pressure good today, will take flu vaccine       HPI: Kevin Little is a 45 y.o. male here for follow-up on upper back pain and hypertension. He states someone told him to come in but I am unsure why. It is early for routine follow-up.  He has not had a flu shot this year. He reports his upper back pain is much improved. An x-ray was negative. He voices no complaints today.   Allergies  Allergen Reactions  . Omeprazole Rash  . Rabeprazole Sodium Rash    ( Aciphex)   Past Medical History  Diagnosis Date  . Herpes simplex   . Herpes   . Hypertension   . Sinus problem    Current Outpatient Prescriptions on File Prior to Visit  Medication Sig Dispense Refill  . lisinopril-hydrochlorothiazide (PRINZIDE,ZESTORETIC) 20-25 MG per tablet Take 1 tablet by mouth daily. 90 tablet 1  . fluticasone (FLONASE) 50 MCG/ACT nasal spray Place 2 sprays into the nose daily. 16 g 0   No current facility-administered medications on file prior to visit.   Family History  Problem Relation Age of Onset  . Hypertension Mother   . Cancer Father     prostate   Social History   Social History  . Marital Status: Married    Spouse Name: N/A  . Number of Children: N/A  . Years of Education: N/A   Occupational History  . Not on file.   Social History Main Topics  . Smoking status: Never Smoker   . Smokeless tobacco: Not on file  . Alcohol Use: No  . Drug Use: No  . Sexual Activity: Not on file   Other Topics Concern  . Not on file   Social History Narrative   ** Merged History Encounter **        Review of Systems: Constitutional: Negative for fever, chills, appetite change, weight loss,  Fatigue. Skin: Negative for  rashes or lesions of concern. HENT: Negative for ear pain, ear discharge.nose bleeds Eyes: Negative for pain, discharge, redness, itching and visual disturbance. Neck: Negative for pain, stiffness Respiratory: Negative for cough, shortness of breath,   Cardiovascular: Negative for chest pain, palpitations and leg swelling. Gastrointestinal: Negative for abdominal pain, nausea, vomiting, diarrhea, constipations Genitourinary: Negative for dysuria, urgency, frequency, hematuria,  Musculoskeletal: Negative for back pain, joint pain, joint  swelling, and gait problem.Negative for weakness. Neurological: Negative for dizziness, tremors, seizures, syncope,   light-headedness, numbness and headaches.  Hematological: Negative for easy bruising or bleeding Psychiatric/Behavioral: Negative for depression, anxiety, decreased concentration, confusion   Objective:   Filed Vitals:   05/09/15 0837  BP: 132/77  Pulse: 77  Temp: 98 F (36.7 C)    Physical Exam: Constitutional: Patient appears well-developed and well-nourished. No distress. HENT: Normocephalic, atraumatic, External right and left ear normal. Oropharynx is clear and moist.  Eyes: Conjunctivae and EOM are normal. PERRLA, no scleral icterus. Neck: Normal ROM. Neck supple. No lymphadenopathy, No thyromegaly. CVS: RRR, S1/S2 +, no murmurs, no gallops, no rubs Pulmonary: Effort and breath sounds normal, no stridor, rhonchi, wheezes, rales.  Abdominal: Soft. Normoactive BS,, no distension, tenderness, rebound or guarding.  Musculoskeletal: Normal range  of motion. No edema and no tenderness.  Neuro: Alert.Normal muscle tone coordination. Non-focal Skin: Skin is warm and dry. No rash noted. Not diaphoretic. No erythema. No pallor. Psychiatric: Normal mood and affect. Behavior, judgment, thought content normal.  Lab Results  Component Value Date   WBC 4.5 12/14/2014   HGB 14.3 12/14/2014   HCT 41.4 12/14/2014   MCV 86.1 12/14/2014    PLT 208 12/14/2014   Lab Results  Component Value Date   CREATININE 0.88 12/14/2014   BUN 14 12/14/2014   NA 138 12/14/2014   K 3.9 12/14/2014   CL 100 12/14/2014   CO2 29 12/14/2014    Lab Results  Component Value Date   HGBA1C  05/21/2008    5.9 (NOTE)   The ADA recommends the following therapeutic goal for glycemic   control related to Hgb A1C measurement:   Goal of Therapy:   < 7.0% Hgb A1C   Reference: American Diabetes Association: Clinical Practice   Recommendations 2008, Diabetes Care,  2008, 31:(Suppl 1).   Lipid Panel     Component Value Date/Time   CHOL 165 07/21/2014 1409   TRIG 219* 07/21/2014 1409   HDL 40 07/21/2014 1409   CHOLHDL 4.1 07/21/2014 1409   VLDL 44* 07/21/2014 1409   LDLCALC 81 07/21/2014 1409       Assessment and plan:   1. Need for influenza vaccine - Flu Vaccine QUAD 36+ mos PF IM (Fluarix & Fluzone Quad PF)  2. Hypertension, under good control -Continue current medication, lisinopril/hctz 20/25  3. Upper back pain - OTC pain medications as needed.   Return if symptoms worsen or fail to improve.  The patient was given clear instructions to go to ER or return to medical center if symptoms don't improve, worsen or new problems develop. The patient verbalized understanding.    Henrietta Hoover FNP  05/09/2015, 11:04 AM
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
## 2015-06-07 ENCOUNTER — Other Ambulatory Visit: Payer: Self-pay | Admitting: Family Medicine

## 2015-06-07 MED FILL — LISINOPRIL-HCTZ 20-25 MG TA: 20-25 | 30 days supply | Qty: 30 | Fill #0

## 2015-07-18 MED FILL — LISINOPRIL-HCTZ 20-25 MG TA: 20-25 | 30 days supply | Qty: 30 | Fill #1

## 2015-08-17 ENCOUNTER — Ambulatory Visit: Payer: No Typology Code available for payment source | Attending: Internal Medicine

## 2015-08-22 MED FILL — LISINOPRIL-HCTZ 20-25 MG TA: 20-25 | 30 days supply | Qty: 30 | Fill #2

## 2015-09-30 MED FILL — LISINOPRIL-HCTZ 20-25 MG TA: 20-25 | 30 days supply | Qty: 30 | Fill #3

## 2015-10-17 ENCOUNTER — Encounter: Payer: Self-pay | Admitting: Family Medicine

## 2015-10-17 ENCOUNTER — Ambulatory Visit (INDEPENDENT_AMBULATORY_CARE_PROVIDER_SITE_OTHER): Payer: No Typology Code available for payment source | Admitting: Family Medicine

## 2015-10-17 VITALS — BP 128/86 | HR 72 | Temp 98.3°F | Resp 16 | Ht 71.0 in | Wt 198.0 lb

## 2015-10-17 DIAGNOSIS — I10 Essential (primary) hypertension: Secondary | ICD-10-CM

## 2015-10-17 LAB — CBC WITH DIFFERENTIAL/PLATELET
Basophils Absolute: 31 cells/uL (ref 0–200)
Basophils Relative: 1 %
EOS ABS: 62 {cells}/uL (ref 15–500)
Eosinophils Relative: 2 %
HEMATOCRIT: 42.9 % (ref 38.5–50.0)
Hemoglobin: 14.8 g/dL (ref 13.2–17.1)
LYMPHS PCT: 49 %
Lymphs Abs: 1519 cells/uL (ref 850–3900)
MCH: 30 pg (ref 27.0–33.0)
MCHC: 34.5 g/dL (ref 32.0–36.0)
MCV: 86.8 fL (ref 80.0–100.0)
MONO ABS: 372 {cells}/uL (ref 200–950)
MONOS PCT: 12 %
MPV: 11.8 fL (ref 7.5–12.5)
NEUTROS PCT: 36 %
Neutro Abs: 1116 cells/uL — ABNORMAL LOW (ref 1500–7800)
Platelets: 190 10*3/uL (ref 140–400)
RBC: 4.94 MIL/uL (ref 4.20–5.80)
RDW: 13.2 % (ref 11.0–15.0)
WBC: 3.1 10*3/uL — ABNORMAL LOW (ref 3.8–10.8)

## 2015-10-17 LAB — COMPLETE METABOLIC PANEL WITH GFR
ALT: 17 U/L (ref 9–46)
AST: 20 U/L (ref 10–40)
Albumin: 4.2 g/dL (ref 3.6–5.1)
Alkaline Phosphatase: 62 U/L (ref 40–115)
BILIRUBIN TOTAL: 0.8 mg/dL (ref 0.2–1.2)
BUN: 10 mg/dL (ref 7–25)
CALCIUM: 9.1 mg/dL (ref 8.6–10.3)
CHLORIDE: 100 mmol/L (ref 98–110)
CO2: 29 mmol/L (ref 20–31)
CREATININE: 0.91 mg/dL (ref 0.60–1.35)
GFR, Est African American: >89 mL/min (ref >=60)
GFR, Est Non African American: >89 mL/min (ref >=60)
Glucose, Bld: 93 mg/dL (ref 65–99)
Potassium: 4 mmol/L (ref 3.5–5.3)
Sodium: 136 mmol/L (ref 135–146)
TOTAL PROTEIN: 7 g/dL (ref 6.1–8.1)

## 2015-10-17 LAB — LIPID PANEL
Cholesterol: 173 mg/dL (ref 125–200)
HDL: 48 mg/dL (ref >=40)
LDL CALC: 107 mg/dL (ref <130)
Total CHOL/HDL Ratio: 3.6 Ratio (ref <=5.0)
Triglycerides: 92 mg/dL (ref <150)
VLDL: 18 mg/dL (ref <30)

## 2015-10-17 NOTE — Progress Notes (Signed)
Kevin Little, is a 45 y.o. male  ZOX:[id number]  [id number]:[id number]  DOB - 02/13/1971  CC:  Chief Complaint  Patient presents with  . Herpes Zoster    wants blood work for shingles.        HPI: Kevin Little is a 45 y.o. male here for follow-up hypertension. He is currently on lisinopril /hctz 20-25. He reports doing well with no specific complaints today.  Allergies  Allergen Reactions  . Omeprazole Rash  . Rabeprazole Sodium Rash    ( Aciphex)   Past Medical History:  Diagnosis Date  . Herpes   . Herpes simplex   . Hypertension   . Sinus problem    Current Outpatient Prescriptions on File Prior to Visit  Medication Sig Dispense Refill  . ibuprofen (ADVIL,MOTRIN) 800 MG tablet Take 1 tablet (800 mg total) by mouth every 8 (eight) hours as needed for mild pain or moderate pain. 15 tablet 0  . lisinopril-hydrochlorothiazide (PRINZIDE,ZESTORETIC) 20-25 MG tablet TAKE 1 TABLET BY MOUTH DAILY. 90 tablet 1   No current facility-administered medications on file prior to visit.    Family History  Problem Relation Age of Onset  . Hypertension Mother   . Cancer Father     prostate   Social History   Social History  . Marital status: Married    Spouse name: N/A  . Number of children: N/A  . Years of education: N/A   Occupational History  . Not on file.   Social History Main Topics  . Smoking status: Never Smoker  . Smokeless tobacco: Not on file  . Alcohol use No  . Drug use: No  . Sexual activity: Not on file   Other Topics Concern  . Not on file   Social History Narrative   ** Merged History Encounter **        Review of Systems: Constitutional: Negative for fever, chills, appetite change, weight loss,  Fatigue. Skin: Negative for rashes or lesions of concern. HENT: Negative for ear pain, ear discharge.nose bleeds Eyes: Negative for pain, discharge, redness, itching and visual disturbance. Neck: Negative for pain, stiffness Respiratory: Negative  for cough, shortness of breath,   Cardiovascular: Negative for chest pain, palpitations and leg swelling. Gastrointestinal: Negative for abdominal pain, nausea, vomiting, diarrhea, constipations Genitourinary: Negative for dysuria, urgency, frequency, hematuria,  Musculoskeletal: Negative for back pain, joint pain, joint  swelling, and gait problem.Negative for weakness. Neurological: Negative for dizziness, tremors, seizures, syncope,   light-headedness, numbness and headaches.  Hematological: Negative for easy bruising or bleeding Psychiatric/Behavioral: Negative for depression, anxiety, decreased concentration, confusion   Objective:   Vitals:   10/17/15 1042  BP: 128/86  Pulse: 72  Resp: 16  Temp: 98.3 F (36.8 C)    Physical Exam: Constitutional: Patient appears well-developed and well-nourished. No distress. HENT: Normocephalic, atraumatic, External right and left ear normal. Oropharynx is clear and moist.  Eyes: Conjunctivae and EOM are normal. PERRLA, no scleral icterus. Neck: Normal ROM. Neck supple. No lymphadenopathy, No thyromegaly. CVS: RRR, S1/S2 +, no murmurs, no gallops, no rubs Pulmonary: Effort and breath sounds normal, no stridor, rhonchi, wheezes, rales.  Abdominal: Soft. Normoactive BS,, no distension, tenderness, rebound or guarding.  Musculoskeletal: Normal range of motion. No edema and no tenderness.  Neuro: Alert.Normal muscle tone coordination. Non-focal Skin: Skin is warm and dry. No rash noted. Not diaphoretic. No erythema. No pallor. Psychiatric: Normal mood and affect. Behavior, judgment, thought content normal.  Lab Results  Component Value Date  WBC 4.5 12/14/2014   HGB 14.3 12/14/2014   HCT 41.4 12/14/2014   MCV 86.1 12/14/2014   PLT 208 12/14/2014   Lab Results  Component Value Date   CREATININE 0.88 12/14/2014   BUN 14 12/14/2014   NA 138 12/14/2014   K 3.9 12/14/2014   CL 100 12/14/2014   CO2 29 12/14/2014    Lab Results   Component Value Date   HGBA1C  05/21/2008    5.9 (NOTE)   The ADA recommends the following therapeutic goal for glycemic   control related to Hgb A1C measurement:   Goal of Therapy:   < 7.0% Hgb A1C   Reference: American Diabetes Association: Clinical Practice   Recommendations 2008, Diabetes Care,  2008, 31:(Suppl 1).   Lipid Panel     Component Value Date/Time   CHOL 165 07/21/2014 1409   TRIG 219 (H) 07/21/2014 1409   HDL 40 07/21/2014 1409   CHOLHDL 4.1 07/21/2014 1409   VLDL 44 (H) 07/21/2014 1409   LDLCALC 81 07/21/2014 1409       Assessment and plan:   1. Essential hypertension  - COMPLETE METABOLIC PANEL WITH GFR - CBC with Differential - Lipid panel   Return in about 3 months (around 01/17/2016).  The patient was given clear instructions to go to ER or return to medical center if symptoms don't improve, worsen or new problems develop. The patient verbalized understanding.    Kevin Hoover FNP  10/17/2015, 4:14 PM

## 2015-11-07 MED FILL — LISINOPRIL-HCTZ 20-25 MG TA: 20-25 | 30 days supply | Qty: 30 | Fill #4

## 2015-11-24 ENCOUNTER — Ambulatory Visit: Payer: Self-pay | Admitting: Family Medicine

## 2015-12-13 MED FILL — LISINOPRIL-HCTZ 20-25 MG TA: 20-25 | 30 days supply | Qty: 30 | Fill #5

## 2016-01-16 MED FILL — LISINOPRIL-HCTZ 20-25 MG TA: 20-25 | 30 days supply | Qty: 30 | Fill #6

## 2016-03-07 MED FILL — LISINOPRIL-HCTZ 20-25 MG TA: 20-25 | 30 days supply | Qty: 30 | Fill #7

## 2016-04-17 ENCOUNTER — Ambulatory Visit: Payer: Self-pay | Attending: Internal Medicine

## 2016-04-17 MED FILL — LISINOPRIL-HCTZ 20-25 MG TA: 20-25 | 30 days supply | Qty: 30 | Fill #8

## 2016-05-02 ENCOUNTER — Ambulatory Visit (INDEPENDENT_AMBULATORY_CARE_PROVIDER_SITE_OTHER): Payer: Self-pay | Admitting: Family Medicine

## 2016-05-02 ENCOUNTER — Encounter: Payer: Self-pay | Admitting: Family Medicine

## 2016-05-02 VITALS — BP 137/78 | HR 74 | Temp 97.9°F | Resp 14 | Ht 71.0 in | Wt 212.0 lb

## 2016-05-02 DIAGNOSIS — I10 Essential (primary) hypertension: Secondary | ICD-10-CM

## 2016-05-02 LAB — POCT URINALYSIS DIP (DEVICE)
BILIRUBIN URINE: NEGATIVE
Glucose, UA: NEGATIVE mg/dL
Hgb urine dipstick: NEGATIVE
Ketones, ur: NEGATIVE mg/dL
Leukocytes, UA: NEGATIVE
NITRITE: NEGATIVE
PH: 8.5 — AB (ref 5.0–8.0)
PROTEIN: NEGATIVE mg/dL
Specific Gravity, Urine: 1.015 (ref 1.005–1.030)
Urobilinogen, UA: 1 mg/dL (ref 0.0–1.0)

## 2016-05-02 LAB — BASIC METABOLIC PANEL
BUN: 11 mg/dL (ref 7–25)
CALCIUM: 9.4 mg/dL (ref 8.6–10.3)
CO2: 27 mmol/L (ref 20–31)
CREATININE: 1.13 mg/dL (ref 0.60–1.35)
Chloride: 103 mmol/L (ref 98–110)
GLUCOSE: 117 mg/dL — AB (ref 65–99)
Potassium: 3.8 mmol/L (ref 3.5–5.3)
SODIUM: 139 mmol/L (ref 135–146)

## 2016-05-02 MED ORDER — LISINOPRIL-HYDROCHLOROTHIAZIDE 20-25 MG PO TABS: 1.0000 | ORAL_TABLET | Freq: Every day | ORAL | 1 refills | Status: DC

## 2016-05-02 NOTE — Progress Notes (Signed)
Subjective:    Patient ID: Kevin Little, male    DOB: February 27, 1971, 46 y.o.   MRN: [id number]  Hypertension  This is a chronic problem. The current episode started more than 1 year ago. The problem is controlled. Pertinent negatives include no anxiety, blurred vision, chest pain, headaches, malaise/fatigue, neck pain, orthopnea, palpitations, peripheral edema, PND, shortness of breath or sweats. There are no associated agents to hypertension. Risk factors for coronary artery disease include male gender and sedentary lifestyle. Past treatments include ACE inhibitors and diuretics. The current treatment provides moderate improvement. There are no compliance problems.  There is no history of angina, kidney disease, CAD/MI, CVA, heart failure, left ventricular hypertrophy, PVD, renovascular disease or retinopathy. There is no history of chronic renal disease, coarctation of the aorta, hyperaldosteronism, hypercortisolism, hyperparathyroidism, a hypertension causing med, pheochromocytoma, sleep apnea or a thyroid problem.     Past Medical History:  Diagnosis Date  . Herpes   . Herpes simplex   . Hypertension   . Sinus problem    Social History   Social History  . Marital status: Married    Spouse name: N/A  . Number of children: N/A  . Years of education: N/A   Occupational History  . Not on file.   Social History Main Topics  . Smoking status: Never Smoker  . Smokeless tobacco: Not on file  . Alcohol use No  . Drug use: No  . Sexual activity: Not on file   Other Topics Concern  . Not on file   Social History Narrative   ** Merged History Encounter **       Immunization History  Administered Date(s) Administered  . Influenza,inj,Quad PF,36+ Mos 01/04/2014, 05/09/2015  . Tdap 09/30/2013    Review of Systems  Constitutional: Negative.  Negative for malaise/fatigue.  HENT: Negative.   Eyes: Negative.  Negative for blurred vision.  Respiratory: Negative.  Negative for  shortness of breath.   Cardiovascular: Negative.  Negative for chest pain, palpitations, orthopnea, leg swelling and PND.  Gastrointestinal: Negative.   Endocrine: Negative.   Genitourinary: Negative.   Musculoskeletal: Negative.  Negative for neck pain.  Skin: Negative.   Allergic/Immunologic: Negative.   Neurological: Negative.  Negative for headaches.       Objective:   Physical Exam  Constitutional: He is oriented to person, place, and time. He appears well-developed and well-nourished.  HENT:  Head: Normocephalic and atraumatic.  Right Ear: External ear normal.  Left Ear: External ear normal.  Nose: Nose normal.  Mouth/Throat: Oropharynx is clear and moist.  Eyes: Conjunctivae and EOM are normal. Pupils are equal, round, and reactive to light.  Neck: Normal range of motion. Neck supple.  Cardiovascular: Normal rate, regular rhythm, normal heart sounds and intact distal pulses.   Pulmonary/Chest: Effort normal and breath sounds normal.  Abdominal: Soft. Bowel sounds are normal.  Musculoskeletal: Normal range of motion.  Neurological: He is alert and oriented to person, place, and time. He has normal reflexes.  Skin: Skin is warm and dry.  Psychiatric: He has a normal mood and affect. His behavior is normal. Judgment and thought content normal.      BP 137/78 (BP Location: Left Arm, Patient Position: Sitting, Cuff Size: Large)   Pulse 74   Temp 97.9 F (36.6 C) (Oral)   Resp 14   Ht 5\' 11"  (1.803 m)   Wt 212 lb (96.2 kg)   SpO2 100%   BMI 29.57 kg/m  Assessment & Plan:  1.  Essential hypertension Blood pressure is at goal on current medication regimen. Will continue medications.The patient is asked to make an attempt to improve diet and exercise patterns to aid in medical management of this problem.     Recommend a lowfat, low carbohydrate diet divided over 5-6 small meals, increase water intake to 6-8 glasses, and 150 minutes per week of cardiovascular exercise.   -  Basic Metabolic Panel - lisinopril-hydrochlorothiazide (PRINZIDE,ZESTORETIC) 20-25 MG tablet; Take 1 tablet by mouth daily.  Dispense: 90 tablet; Refill: 1    Preventative maintenance:  Recommend PSA and digital rectal examination Up to date with vaccinations    Massie MaroonHollis,Ailis Rigaud M, FNP

## 2016-05-02 NOTE — Patient Instructions (Addendum)
Managing Your Hypertension Hypertension is commonly called high blood pressure. Blood pressure is a measurement of how strongly your blood is pressing against the walls of your arteries. Arteries are blood vessels that carry blood from your heart throughout your body. Blood pressure does not stay the same. It rises when you are active, excited, or nervous. It lowers when you are sleeping or relaxed. If the numbers that measure your blood pressure stay above normal most of the time, you are at risk for health problems. Hypertension is a long-term (chronic) condition in which blood pressure is elevated. This condition often has no signs or symptoms. The cause of the condition is usually not known. What are blood pressure readings? A blood pressure reading is recorded as two numbers, such as "120 over 80" (or 120/80). The first ("top") number is called the systolic pressure. It is a measure of the pressure in your arteries as the heart beats. The second ("bottom") number is called the diastolic pressure. It is a measure of the pressure in your arteries as the heart relaxes between beats. What does my blood pressure reading mean? Blood pressure is classified into four stages. Based on your blood pressure reading, your health care provider may use the following stages to determine what type of treatment, if any, is needed. Systolic pressure and diastolic pressure are measured in a unit called mm Hg. Normal  Systolic pressure: below 123456.  Diastolic pressure: below 80. Prehypertension  Systolic pressure: 123456.  Diastolic pressure: [id number]. Hypertension stage 1  Systolic pressure: A999333.  Diastolic pressure: A999333. Hypertension stage 2  Systolic pressure: 0000000 or above.  Diastolic pressure: 123XX123 or above. What health risks are associated with hypertension? Managing your hypertension is an important responsibility. Uncontrolled hypertension can lead to:  A heart attack.  A stroke.  A  weakened blood vessel (aneurysm).  Heart failure.  Kidney damage.  Eye damage.  Metabolic syndrome.  Memory and concentration problems. What changes can I make to manage my hypertension? Hypertension can be managed effectively by making lifestyle changes and possibly by taking medicines. Your health care provider will help you come up with a plan to bring your blood pressure within a normal range. Your plan should include the following: Monitoring  Monitor your blood pressure at home as told by your health care provider. Your personal target blood pressure may vary depending on your medical conditions, your age, and other factors.  Have your blood pressure rechecked as told by your health care provider. Lifestyle  Lose weight if necessary.  Get at least 30-45 minutes of aerobic exercise at least 4 times a week.  Do not use any products that contain nicotine or tobacco, such as cigarettes and e-cigarettes. If you need help quitting, ask your health care provider.  Learn ways to reduce stress.  Control any chronic conditions, such as high cholesterol or diabetes. Eating and drinking  Follow the DASH diet. This diet is high in fruits, vegetables, and whole grains. It is low in salt, red meat, and added sugars.  Keep your sodium intake below 2,300 mg per day.  Limit alcoholic beverages. Communication  Review all the medicines you take with your health care provider because there may be side effects or interactions.  Talk with your health care provider about your diet, exercise habits, and other lifestyle factors that may be contributing to hypertension.  See your health care provider regularly. Your health care provider can help you create and adjust your plan for managing hypertension. Will  I need medicine to control my blood pressure? Your health care provider may prescribe medicine if lifestyle changes are not enough to get your blood pressure under control, and if one of  the following is true:  You are 8118-46 years of age, and your systolic blood pressure is 140 or higher.  You are 46 years of age or older, and your systolic blood pressure is 150 or higher.  Your diastolic blood pressure is 90 or higher.  You have diabetes, and your systolic blood pressure is over 140 or your diastolic blood pressure is over 90.  You have kidney disease, and your blood pressure is above 140/90.  You have heart disease or a history of stroke, and your blood pressure is 140/90 or higher. Take medicines only as told by your health care provider. Follow the directions carefully. Blood pressure medicines must be taken as prescribed. The medicine does not work as well when you skip doses. Skipping doses also puts you at risk for problems. Contact a health care provider if:  You think you are having a reaction to medicines you have taken.  You have repeated (recurrent) headaches.  You feel dizzy.  You have swelling in your ankles.  You have trouble with your vision. Get help right away if:  You develop a severe headache or confusion.  You have unusual weakness or numbness, or you feel faint.  You have severe pain in your chest or abdomen.  You vomit repeatedly.  You have trouble breathing. This information is not intended to replace advice given to you by your health care provider. Make sure you discuss any questions you have with your health care provider. Document Released: 11/28/2011 Document Revised: 11/08/2015 Document Reviewed: 06/03/2015 Elsevier Interactive Patient Education  2017 [employer]  Preventing Hypertension Hypertension, commonly called high blood pressure, is when the force of blood pumping through the arteries is too strong. Arteries are blood vessels that carry blood from the heart throughout the body. Over time, hypertension can damage the arteries and decrease blood flow to important parts of the body, including the brain, heart, and  kidneys. Often, hypertension does not cause symptoms until blood pressure is very high. For this reason, it is important to have your blood pressure checked on a regular basis. Hypertension can often be prevented with diet and lifestyle changes. If you already have hypertension, you can control it with diet and lifestyle changes, as well as medicine. What nutrition changes can be made? Maintain a healthy diet. This includes:  Eating less salt (sodium). Ask your health care provider how much sodium is safe for you to have. The general recommendation is to consume less than 1 tsp (2,300 mg) of sodium a day.  Do not add salt to your food.  Choose low-sodium options when grocery shopping and eating out.  Limiting fats in your diet. You can do this by eating low-fat or fat-free dairy products and by eating less red meat.  Eating more fruits, vegetables, and whole grains. Make a goal to eat:  1-2 cups of fresh fruits and vegetables each day.  3-4 servings of whole grains each day.  Avoiding foods and beverages that have added sugars.  Eating fish that contain healthy fats (omega-3 fatty acids), such as mackerel or salmon. If you need help putting together a healthy eating plan, try the DASH diet. This diet is high in fruits, vegetables, and whole grains. It is low in sodium, red meat, and added sugars. DASH stands for Dietary  Approaches to Stop Hypertension. What lifestyle changes can be made?  Lose weight if you are overweight. Losing just 3?5% of your body weight can help prevent or control hypertension.  For example, if your present weight is 200 lb (91 kg), a loss of 3-5% of your weight means losing 6-10 lb (2.7-4.5 kg).  Ask your health care provider to help you with a diet and exercise plan to safely lose weight.  Get enough exercise. Do at least 150 minutes of moderate-intensity exercise each week.  You could do this in short exercise sessions several times a day, or you could do  longer exercise sessions a few times a week. For example, you could take a brisk 10-minute walk or bike ride, 3 times a day, for 5 days a week.  Find ways to reduce stress, such as exercising, meditating, listening to music, or taking a yoga class. If you need help reducing stress, ask your health care provider.  Do not smoke. This includes e-cigarettes. Chemicals in tobacco and nicotine products raise your blood pressure each time you smoke. If you need help quitting, ask your health care provider.  Avoid alcohol. If you drink alcohol, limit alcohol intake to no more than 1 drink a day for nonpregnant women and 2 drinks a day for men. One drink equals 12 oz of beer, 5 oz of wine, or 1 oz of hard liquor. Why are these changes important? Diet and lifestyle changes can help you prevent hypertension, and they may make you feel better overall and improve your quality of life. If you have hypertension, making these changes will help you control it and help prevent major complications, such as:  Hardening and narrowing of arteries that supply blood to:  Your heart. This can cause a heart attack.  Your brain. This can cause a stroke.  Your kidneys. This can cause kidney failure.  Stress on your heart muscle, which can cause heart failure. What can I do to lower my risk?  Work with your health care provider to make a hypertension prevention plan that works for you. Follow your plan and keep all follow-up visits as told by your health care provider.  Learn how to check your blood pressure at home. Make sure that you know your personal target blood pressure, as told by your health care provider. How is this treated? In addition to diet and lifestyle changes, your health care provider may recommend medicines to help lower your blood pressure. You may need to try a few different medicines to find what works best for you. You also may need to take more than one medicine. Take over-the-counter and  prescription medicines only as told by your health care provider. Where to find support: Your health care provider can help you prevent hypertension and help you keep your blood pressure at a healthy level. Your local hospital or your community may also provide support services and prevention programs. The American Heart Association offers an online support network at: https://www.lee.net/ Where to find more information: Learn more about hypertension from:  National Heart, Lung, and Blood Institute: https://www.peterson.org/  Centers for Disease Control and Prevention: AboutHD.co.nz  American Academy of Family Physicians: http://familydoctor.org/familydoctor/en/diseases-conditions/high-blood-pressure.printerview.all.html Learn more about the DASH diet from:  National Heart, Lung, and Blood Institute: WedMap.it Contact a health care provider if:  You think you are having a reaction to medicines you have taken.  You have recurrent headaches or feel dizzy.  You have swelling in your ankles.  You have trouble with  your vision. Summary  Hypertension often does not cause any symptoms until blood pressure is very high. It is important to get your blood pressure checked regularly.  Diet and lifestyle changes are the most important steps in preventing hypertension.  By keeping your blood pressure in a healthy range, you can prevent complications like heart attack, heart failure, stroke, and kidney failure.  Work with your health care provider to make a hypertension prevention plan that works for you. This information is not intended to replace advice given to you by your health care provider. Make sure you discuss any questions you have with your health care provider. Document Released: 03/20/2015 Document Revised: 11/14/2015 Document Reviewed: 11/14/2015 Elsevier Interactive Patient  Education  2017 [employer].

## 2016-06-05 MED FILL — LISINOPRIL-HCTZ 20-25 MG TA: 20-25 | 30 days supply | Qty: 30 | Fill #0

## 2016-07-17 MED FILL — LISINOPRIL-HCTZ 20-25 MG TA: 20-25 | 30 days supply | Qty: 30 | Fill #1

## 2016-09-13 MED FILL — LISINOPRIL-HCTZ 20-25 MG TA: 20-25 | 30 days supply | Qty: 30 | Fill #2

## 2016-09-18 ENCOUNTER — Other Ambulatory Visit: Payer: Self-pay

## 2016-09-18 ENCOUNTER — Ambulatory Visit (INDEPENDENT_AMBULATORY_CARE_PROVIDER_SITE_OTHER): Payer: No Typology Code available for payment source | Admitting: Family Medicine

## 2016-09-18 ENCOUNTER — Encounter: Payer: Self-pay | Admitting: Family Medicine

## 2016-09-18 VITALS — BP 134/94 | HR 74 | Temp 98.5°F | Resp 16 | Ht 71.0 in | Wt 202.0 lb

## 2016-09-18 DIAGNOSIS — I10 Essential (primary) hypertension: Secondary | ICD-10-CM

## 2016-09-18 DIAGNOSIS — R635 Abnormal weight gain: Secondary | ICD-10-CM

## 2016-09-18 DIAGNOSIS — M545 Low back pain: Secondary | ICD-10-CM

## 2016-09-18 DIAGNOSIS — R39198 Other difficulties with micturition: Secondary | ICD-10-CM

## 2016-09-18 DIAGNOSIS — G8929 Other chronic pain: Secondary | ICD-10-CM

## 2016-09-18 LAB — POCT URINALYSIS DIP (DEVICE)
BILIRUBIN URINE: NEGATIVE
Glucose, UA: NEGATIVE mg/dL
HGB URINE DIPSTICK: NEGATIVE
KETONES UR: NEGATIVE mg/dL
LEUKOCYTES UA: NEGATIVE
NITRITE: NEGATIVE
Protein, ur: NEGATIVE mg/dL
Specific Gravity, Urine: 1.02 (ref 1.005–1.030)
Urobilinogen, UA: 0.2 mg/dL (ref 0.0–1.0)
pH: 6 (ref 5.0–8.0)

## 2016-09-18 LAB — POCT GLYCOSYLATED HEMOGLOBIN (HGB A1C): Hemoglobin A1C: 5.8

## 2016-09-18 MED ORDER — LISINOPRIL-HYDROCHLOROTHIAZIDE 20-25 MG PO TABS: 1.0000 | ORAL_TABLET | Freq: Every day | ORAL | 1 refills | Status: DC

## 2016-09-18 NOTE — Telephone Encounter (Signed)
Sent in rx via e-script. Thanks!

## 2016-09-18 NOTE — Progress Notes (Signed)
Subjective:    Patient ID: Kevin Little, male    DOB: 04/27/1970, 46 y.o.   MRN: [id number]  Kevin GurneyFrancois Ferber presents for a follow up of hypertension and low back pain. He has been taking lisinopril-hydrochlorothiazide consistently. He does not follow a lowfat, low sodium diet or exercise consistently. He says that he travels for work and is very active. He does not check blood pressure at home. He denies dizziness, heart palpitations, chest pain, fatigue,or lower extremity edema.  Kevin Little is also complaining of intermittently low back pain. He says that pain typically occurs with prolonged standing and sitting. He has not attempted any OTC interventions to alleviate symptoms. He is not having pain at present. He denies dysuria, urinary frequency, or incontinence of urine or stool.      Past Medical History:  Diagnosis Date  . Herpes   . Herpes simplex   . Hypertension   . Sinus problem    Social History   Social History  . Marital status: Married    Spouse name: N/A  . Number of children: N/A  . Years of education: N/A   Occupational History  . Not on file.   Social History Main Topics  . Smoking status: Never Smoker  . Smokeless tobacco: Never Used  . Alcohol use No  . Drug use: No  . Sexual activity: Not on file   Other Topics Concern  . Not on file   Social History Narrative   ** Merged History Encounter **       Immunization History  Administered Date(s) Administered  . Influenza,inj,Quad PF,36+ Mos 01/04/2014, 05/09/2015  . Tdap 09/30/2013    Review of Systems  Constitutional: Positive for unexpected weight change (Weight gain greater than 10 pounds over the past year. ). Negative for malaise/fatigue.  HENT: Negative.   Eyes: Negative.  Negative for blurred vision.  Respiratory: Negative.  Negative for shortness of breath.   Cardiovascular: Negative.  Negative for chest pain, palpitations, orthopnea, leg swelling and PND.  Gastrointestinal: Negative.    Endocrine: Negative.   Genitourinary: Negative.        Reports a decreased urine stream periodically  Musculoskeletal: Negative.  Negative for neck pain.  Skin: Negative.   Allergic/Immunologic: Negative.   Neurological: Negative.  Negative for headaches.       Objective:   Physical Exam  Constitutional: He is oriented to person, place, and time. He appears well-developed and well-nourished.  HENT:  Head: Normocephalic and atraumatic.  Right Ear: External ear normal.  Left Ear: External ear normal.  Nose: Nose normal.  Mouth/Throat: Oropharynx is clear and moist.  Eyes: Conjunctivae and EOM are normal. Pupils are equal, round, and reactive to light.  Neck: Normal range of motion. Neck supple.  Cardiovascular: Normal rate, regular rhythm, normal heart sounds and intact distal pulses.   Pulmonary/Chest: Effort normal and breath sounds normal.  Abdominal: Soft. Bowel sounds are normal.  Musculoskeletal:       Lumbar back: He exhibits tenderness (With stretching and transitioning from lying to sitting). He exhibits normal range of motion, no pain and no spasm.  Neurological: He is alert and oriented to person, place, and time. He has normal reflexes.  Skin: Skin is warm and dry.  Psychiatric: He has a normal mood and affect. His behavior is normal. Judgment and thought content normal.      BP (!) 134/94 (BP Location: Right Arm, Patient Position: Sitting, Cuff Size: Normal)   Pulse 74   Temp 98.5  F (36.9 C) (Oral)   Resp 16   Ht 5\' 11"  (1.803 m)   Wt 202 lb (91.6 kg)   SpO2 100%   BMI 28.17 kg/m  Assessment & Plan:  . 1. Essential hypertension Blood pressure is at goal on current medication regimen. Reviewed urinalysis, no proteinuria present. Will review BUN/Creatinine level as it comes available.  - POCT urinalysis dip (device)  2. Chronic midline low back pain without sciatica Patient reports back pain periodically and it is not reproducible on physical exam. He  spends a great deal of time traveling with prolonged sitting. I recommend a low impact exercise regimen and back stretches. Kevin Little was shown back exercises and stretches online. I also provided written information.  I also recommend heath therapy. Apply warm, moist compresses to lower back as needed.   3. Decreased urine stream Urinalysis unremarkable. Patient will return in 1 month for a CPE with prostate exam. Will also check BUN/Creatinine.  - BASIC METABOLIC PANEL WITH GFR - POCT urinalysis dip (device)  4. Weight gain Patient complains of periodic weight gain over the past year. He has been eating fast food and has is mostly sedentary. The patient is asked to make an attempt to improve diet and exercise patterns to aid in medical management of this problem. I will also screen for type 2 diabetes mellitus and check TSH to r/o pathology.  - TSH - POCT glycosylated hemoglobin (Hb A1C)    Preventative maintenance:  Recommend PSA and digital rectal examination Up to date with vaccinations   RTC: 1 month for CPE   Nolon Nations  MSN, FNP-C [facility] Patient [facility] [address] [place], [place] [address] [phone]

## 2016-09-18 NOTE — Patient Instructions (Addendum)
Hypertension: Will continue lisinopril-hydrochlorothiazide.   Recommend a lowfat, low carbohydrate diet divided over 5-6 small meals, increase water intake to 6-8 glasses, and 150 minutes per week of cardiovascular exercise.   Back pain: Recommend the following exercises to alleviate back pain:   Back Exercises If you have pain in your back, do these exercises 2-3 times each day or as told by your doctor. When the pain goes away, do the exercises once each day, but repeat the steps more times for each exercise (do more repetitions). If you do not have pain in your back, do these exercises once each day or as told by your doctor. Exercises Single Knee to Chest  Do these steps 3-5 times in a row for each leg: 1. Lie on your back on a firm bed or the floor with your legs stretched out. 2. Bring one knee to your chest. 3. Hold your knee to your chest by grabbing your knee or thigh. 4. Pull on your knee until you feel a gentle stretch in your lower back. 5. Keep doing the stretch for 10-30 seconds. 6. Slowly let go of your leg and straighten it.  Pelvic Tilt  Do these steps 5-10 times in a row: 1. Lie on your back on a firm bed or the floor with your legs stretched out. 2. Bend your knees so they point up to the ceiling. Your feet should be flat on the floor. 3. Tighten your lower belly (abdomen) muscles to press your lower back against the floor. This will make your tailbone point up to the ceiling instead of pointing down to your feet or the floor. 4. Stay in this position for 5-10 seconds while you gently tighten your muscles and breathe evenly.  Cat-Cow  Do these steps until your lower back bends more easily: 1. Get on your hands and knees on a firm surface. Keep your hands under your shoulders, and keep your knees under your hips. You may put padding under your knees. 2. Let your head hang down, and make your tailbone point down to the floor so your lower back is round like the back  of a cat. 3. Stay in this position for 5 seconds. 4. Slowly lift your head and make your tailbone point up to the ceiling so your back hangs low (sags) like the back of a cow. 5. Stay in this position for 5 seconds.  Press-Ups  Do these steps 5-10 times in a row: 1. Lie on your belly (face-down) on the floor. 2. Place your hands near your head, about shoulder-width apart. 3. While you keep your back relaxed and keep your hips on the floor, slowly straighten your arms to raise the top half of your body and lift your shoulders. Do not use your back muscles. To make yourself more comfortable, you may change where you place your hands. 4. Stay in this position for 5 seconds. 5. Slowly return to lying flat on the floor.  Bridges  Do these steps 10 times in a row: 1. Lie on your back on a firm surface. 2. Bend your knees so they point up to the ceiling. Your feet should be flat on the floor. 3. Tighten your butt muscles and lift your butt off of the floor until your waist is almost as high as your knees. If you do not feel the muscles working in your butt and the back of your thighs, slide your feet 1-2 inches farther away from your butt. 4. Stay in  this position for 3-5 seconds. 5. Slowly lower your butt to the floor, and let your butt muscles relax.  If this exercise is too easy, try doing it with your arms crossed over your chest. Belly Crunches  Do these steps 5-10 times in a row: 1. Lie on your back on a firm bed or the floor with your legs stretched out. 2. Bend your knees so they point up to the ceiling. Your feet should be flat on the floor. 3. Cross your arms over your chest. 4. Tip your chin a little bit toward your chest but do not bend your neck. 5. Tighten your belly muscles and slowly raise your chest just enough to lift your shoulder blades a tiny bit off of the floor. 6. Slowly lower your chest and your head to the floor.  Back Lifts Do these steps 5-10 times in a  row: 1. Lie on your belly (face-down) with your arms at your sides, and rest your forehead on the floor. 2. Tighten the muscles in your legs and your butt. 3. Slowly lift your chest off of the floor while you keep your hips on the floor. Keep the back of your head in line with the curve in your back. Look at the floor while you do this. 4. Stay in this position for 3-5 seconds. 5. Slowly lower your chest and your face to the floor.  Contact a doctor if:  Your back pain gets a lot worse when you do an exercise.  Your back pain does not lessen 2 hours after you exercise. If you have any of these problems, stop doing the exercises. Do not do them again unless your doctor says it is okay. Get help right away if:  You have sudden, very bad back pain. If this happens, stop doing the exercises. Do not do them again unless your doctor says it is okay. This information is not intended to replace advice given to you by your health care provider. Make sure you discuss any questions you have with your health care provider. Document Released: 04/07/2010 Document Revised: 08/11/2015 Document Reviewed: 04/29/2014 Elsevier Interactive Patient Education  [employer].

## 2016-09-19 LAB — BASIC METABOLIC PANEL WITH GFR
BUN: 11 mg/dL (ref 7–25)
CHLORIDE: 103 mmol/L (ref 98–110)
CO2: 29 mmol/L (ref 20–31)
CREATININE: 1.05 mg/dL (ref 0.60–1.35)
Calcium: 9.3 mg/dL (ref 8.6–10.3)
GFR, Est Non African American: 85 mL/min (ref >=60)
Glucose, Bld: 99 mg/dL (ref 65–99)
Potassium: 3.9 mmol/L (ref 3.5–5.3)
Sodium: 141 mmol/L (ref 135–146)

## 2016-09-19 LAB — TSH: TSH: 0.7 m[IU]/L (ref 0.40–4.50)

## 2016-10-23 MED FILL — LISINOPRIL-HCTZ 20-25 MG TA: 20-25 | 30 days supply | Qty: 30 | Fill #3

## 2016-10-30 ENCOUNTER — Ambulatory Visit (INDEPENDENT_AMBULATORY_CARE_PROVIDER_SITE_OTHER): Payer: No Typology Code available for payment source | Admitting: Family Medicine

## 2016-10-30 ENCOUNTER — Encounter: Payer: Self-pay | Admitting: Family Medicine

## 2016-10-30 VITALS — BP 142/90 | HR 76 | Temp 98.1°F | Resp 14 | Ht 71.0 in | Wt 200.3 lb

## 2016-10-30 DIAGNOSIS — Z1211 Encounter for screening for malignant neoplasm of colon: Secondary | ICD-10-CM

## 2016-10-30 DIAGNOSIS — Z130 Encounter for screening for diseases of the blood and blood-forming organs and certain disorders involving the immune mechanism: Secondary | ICD-10-CM

## 2016-10-30 DIAGNOSIS — Z0000 Encounter for general adult medical examination without abnormal findings: Secondary | ICD-10-CM

## 2016-10-30 DIAGNOSIS — Z13220 Encounter for screening for lipoid disorders: Secondary | ICD-10-CM

## 2016-10-30 DIAGNOSIS — Z8042 Family history of malignant neoplasm of prostate: Secondary | ICD-10-CM

## 2016-10-30 LAB — CBC WITH DIFFERENTIAL/PLATELET
Basophils Absolute: 38 cells/uL (ref 0–200)
Basophils Relative: 1 %
EOS ABS: 76 {cells}/uL (ref 15–500)
Eosinophils Relative: 2 %
HEMATOCRIT: 44.7 % (ref 38.5–50.0)
HEMOGLOBIN: 15.2 g/dL (ref 13.2–17.1)
LYMPHS ABS: 2052 {cells}/uL (ref 850–3900)
LYMPHS PCT: 54 %
MCH: 29.1 pg (ref 27.0–33.0)
MCHC: 34 g/dL (ref 32.0–36.0)
MCV: 85.6 fL (ref 80.0–100.0)
MONO ABS: 418 {cells}/uL (ref 200–950)
MPV: 11.3 fL (ref 7.5–12.5)
Monocytes Relative: 11 %
Neutro Abs: 1216 cells/uL — ABNORMAL LOW (ref 1500–7800)
Neutrophils Relative %: 32 %
Platelets: 208 10*3/uL (ref 140–400)
RBC: 5.22 MIL/uL (ref 4.20–5.80)
RDW: 13.2 % (ref 11.0–15.0)
WBC: 3.8 10*3/uL (ref 3.8–10.8)

## 2016-10-30 LAB — POCT URINALYSIS DIP (DEVICE)
Bilirubin Urine: NEGATIVE
Glucose, UA: NEGATIVE mg/dL
HGB URINE DIPSTICK: NEGATIVE
Ketones, ur: NEGATIVE mg/dL
Leukocytes, UA: NEGATIVE
Nitrite: NEGATIVE
PROTEIN: NEGATIVE mg/dL
SPECIFIC GRAVITY, URINE: 1.02 (ref 1.005–1.030)
UROBILINOGEN UA: 0.2 mg/dL (ref 0.0–1.0)
pH: 7 (ref 5.0–8.0)

## 2016-10-30 NOTE — Progress Notes (Signed)
Patient ID: Kevin Little, male    DOB: February 09, 1971, 46 y.o.   MRN: [id number]  PCP: [name], FNP  Chief Complaint  Patient presents with  . Annual Exam    Subjective:  HPI Kevin Little is a 46 y.o. male presents for an annual physical exam. Medical history is only significant for hypertension and chronic low back pain. Kevin Little denies any acute complaints today. He is requesting a prostate screening and exam today as he is 46 years old and his father passed away from prostate cancer. Kevin Little denies any associated urinary frequency, urine retention, and weakness of urine stream. Kevin Little suffers from hypertension and reports compliance with current medication regimen, although he does not adhere to a low sodium diet and lives a very sedentary lifestyle. He reports that he works long hours and it is difficult to include exercise in his daily routine. Kevin Little denies any associated chest pain, shortness of breath, or persistent fatigue. His current Body mass index is 27.94 kg/m.  Social History   Social History  . Marital status: Married    Spouse name: N/A  . Number of children: N/A  . Years of education: N/A   Occupational History  . Not on file.   Social History Main Topics  . Smoking status: Never Smoker  . Smokeless tobacco: Never Used  . Alcohol use No  . Drug use: No  . Sexual activity: Not on file   Other Topics Concern  . Not on file   Social History Narrative   ** Merged History Encounter **        Family History  Problem Relation Age of Onset  . Hypertension Mother   . Cancer Father        prostate   Review of Systems  Constitutional: Negative.   HENT: Negative.   Respiratory: Negative.   Cardiovascular: Negative.   Gastrointestinal: Negative.   Endocrine: Negative.   Genitourinary: Negative.   Musculoskeletal: Positive for back pain.  Skin: Negative.   Neurological: Negative.   Hematological: Negative.   Psychiatric/Behavioral:  Negative.     Patient Active Problem List   Diagnosis Date Noted  . Need for immunization against influenza 01/04/2014  . Need for Tdap vaccination 10/04/2013  . Midline low back pain without sciatica 10/04/2013  . CHEST PAIN 02/16/2010  . DERMATITIS DUE DRUGS&MEDICINES TAKEN INTERNALLY 08/05/2008  . Essential hypertension 01/21/2007  . GASTRITIS 01/21/2007  . ARTHRITIS 01/21/2007    Allergies  Allergen Reactions  . Omeprazole Rash  . Rabeprazole Sodium Rash    ( Aciphex)    Prior to Admission medications   Medication Sig Start Date End Date Taking? Authorizing Provider  lisinopril-hydrochlorothiazide (PRINZIDE,ZESTORETIC) 20-25 MG tablet Take 1 tablet by mouth daily. 09/18/16  Yes [name], FNP  Past Medical, Surgical Family and Social History reviewed and updated.  Objective:   Today's Vitals   10/30/16 1052  BP: (!) 142/90  Pulse: 76  Resp: 14  Temp: 98.1 F (36.7 C)  TempSrc: Oral  SpO2: 98%  Weight: 200 lb 4.8 oz (90.9 kg)  Height: 5\' 11"  (1.803 m)   Wt Readings from Last 3 Encounters:  10/30/16 200 lb 4.8 oz (90.9 kg)  09/18/16 202 lb (91.6 kg)  05/02/16 212 lb (96.2 kg)   Physical Exam  Constitutional: He is oriented to person, place, and time. He appears well-developed and well-nourished.  HENT:  Head: Normocephalic and atraumatic.  Right Ear: External ear normal.  Left Ear: External ear normal.  Nose: Nose normal.  Mouth/Throat: Oropharynx is clear and moist.  Eyes: Pupils are equal, round, and reactive to light. Conjunctivae and EOM are normal.  Neck: Normal range of motion. Neck supple.  Cardiovascular: Normal rate, regular rhythm, normal heart sounds and intact distal pulses.   Pulmonary/Chest: Effort normal and breath sounds normal.  Abdominal: Soft. Bowel sounds are normal.  Genitourinary: Rectum normal and prostate normal. Rectal exam shows guaiac negative stool.  Musculoskeletal: Normal range of motion.  Lymphadenopathy:    He has  no cervical adenopathy.  Neurological: He is alert and oriented to person, place, and time.  Skin: Skin is warm and dry.  Psychiatric: He has a normal mood and affect. His behavior is normal. Judgment and thought content normal.   Assessment & Plan:  1. Annual physical exam-Age appropriate anticipatory guidance provided  2. Family history of prostate cancer- rectal exam unremarkable, will obtain PSA. Recommend annual PSA due to 1st degree relative (Father) having a hx of prostate cancer. 3. Lipid screening 4. Colon cancer screening-IFOBT-negative, colonoscopy recommended at age 46. 5. Screening for deficiency anemia  Orders Placed This Encounter  Procedures  . PSA  . Lipid panel  . CBC with Differential  . POCT urinalysis dip (device)   RTC: 6 months for Hypertension follow-up with Julianne HandlerLachina Hollis, FNP-C.  Godfrey PickKimberly S. Tiburcio PeaHarris, MSN, FNP-C The Patient Care [facility]  [address] [place], [place] [address] [phone]

## 2016-10-31 LAB — LIPID PANEL
CHOLESTEROL: 201 mg/dL — AB (ref <200)
HDL: 51 mg/dL (ref >40)
LDL Cholesterol: 128 mg/dL — ABNORMAL HIGH (ref <100)
Total CHOL/HDL Ratio: 3.9 Ratio (ref <5.0)
Triglycerides: 109 mg/dL (ref <150)
VLDL: 22 mg/dL (ref <30)

## 2016-10-31 LAB — PSA: PSA: 0.4 ng/mL (ref <=4.0)

## 2016-11-15 ENCOUNTER — Ambulatory Visit: Payer: No Typology Code available for payment source | Attending: Internal Medicine

## 2016-12-04 MED FILL — LISINOPRIL-HCTZ 20-25 MG TA: 20-25 | 30 days supply | Qty: 30 | Fill #4

## 2017-01-15 MED FILL — LISINOPRIL-HCTZ 20-25 MG TA: 20-25 | 30 days supply | Qty: 30 | Fill #5

## 2017-02-10 ENCOUNTER — Emergency Department (HOSPITAL_COMMUNITY)
Admission: EM | Admit: 2017-02-10 | Discharge: 2017-02-10 | Disposition: A | Discharge status: Home / Self Care | Payer: Self-pay | Attending: Emergency Medicine | Admitting: Emergency Medicine

## 2017-02-10 ENCOUNTER — Encounter (HOSPITAL_COMMUNITY): Payer: Self-pay | Admitting: *Deleted

## 2017-02-10 ENCOUNTER — Other Ambulatory Visit: Payer: Self-pay

## 2017-02-10 DIAGNOSIS — Z79899 Other long term (current) drug therapy: Secondary | ICD-10-CM | POA: Insufficient documentation

## 2017-02-10 DIAGNOSIS — N401 Enlarged prostate with lower urinary tract symptoms: Secondary | ICD-10-CM | POA: Insufficient documentation

## 2017-02-10 DIAGNOSIS — I10 Essential (primary) hypertension: Secondary | ICD-10-CM | POA: Insufficient documentation

## 2017-02-10 LAB — URINALYSIS, ROUTINE W REFLEX MICROSCOPIC
Bilirubin Urine: NEGATIVE
Glucose, UA: NEGATIVE mg/dL
Hgb urine dipstick: NEGATIVE
Ketones, ur: NEGATIVE mg/dL
Leukocytes, UA: NEGATIVE
Nitrite: NEGATIVE
Protein, ur: NEGATIVE mg/dL
Specific Gravity, Urine: 1.012 (ref 1.005–1.030)
pH: 7 (ref 5.0–8.0)

## 2017-02-10 MED ORDER — TAMSULOSIN HCL 0.4 MG PO CAPS: 0.4000 mg | ORAL_CAPSULE | Freq: Every day | ORAL | 0 refills | Status: DC

## 2017-02-10 NOTE — ED Provider Notes (Signed)
[facility] EMERGENCY [facility] Provider Note   CSN: [id number] Arrival date & time: 02/10/17  1103     History   Chief Complaint Chief Complaint  Patient presents with  . Urinary Retention    HPI Imagene GurneyFrancois Willert is a 46 y.o. male.  HPI   46 year old male with several urinary complaints.  Over the past several months he has noticed steadily increasing urgency and hesitancy.  Feels like he needs to use the bathroom every 1-2 hours.  His stream has been weak.  Dysuria.  No hematuria.  Does have some mild discomfort suprapubically and sometimes in his lower back.  He is unsure if these are related.  No discharge.  No nausea or vomiting. Past Medical History:  Diagnosis Date  . Herpes   . Herpes simplex   . Hypertension   . Sinus problem     Patient Active Problem List   Diagnosis Date Noted  . Need for immunization against influenza 01/04/2014  . Need for Tdap vaccination 10/04/2013  . Midline low back pain without sciatica 10/04/2013  . CHEST PAIN 02/16/2010  . DERMATITIS DUE DRUGS&MEDICINES TAKEN INTERNALLY 08/05/2008  . Essential hypertension 01/21/2007  . GASTRITIS 01/21/2007  . ARTHRITIS 01/21/2007    History reviewed. No pertinent surgical history.     Home Medications    Prior to Admission medications   Medication Sig Start Date End Date Taking? Authorizing Provider  lisinopril-hydrochlorothiazide (PRINZIDE,ZESTORETIC) 20-25 MG tablet Take 1 tablet by mouth daily. 09/18/16   Massie MaroonHollis, Lachina M, FNP    Family History Family History  Problem Relation Age of Onset  . Hypertension Mother   . Cancer Father        prostate    Social History Social History   Tobacco Use  . Smoking status: Never Smoker  . Smokeless tobacco: Never Used  Substance Use Topics  . Alcohol use: No  . Drug use: No     Allergies   Omeprazole and Rabeprazole sodium   Review of Systems Review of Systems  All systems reviewed and negative, other than  as noted in HPI.  Physical Exam Updated Vital Signs BP (!) 140/91 (BP Location: Right Arm)   Pulse 79   Temp 98 F (36.7 C) (Oral)   Resp 16   Ht 5\' 10"  (1.778 m)   Wt 87.1 kg (192 lb)   SpO2 97%   BMI 27.55 kg/m   Physical Exam  Constitutional: He appears well-developed and well-nourished. No distress.  HENT:  Head: Normocephalic and atraumatic.  Eyes: Conjunctivae are normal. Right eye exhibits no discharge. Left eye exhibits no discharge.  Neck: Neck supple.  Cardiovascular: Normal rate, regular rhythm and normal heart sounds. Exam reveals no gallop and no friction rub.  No murmur heard. Pulmonary/Chest: Effort normal and breath sounds normal. No respiratory distress.  Abdominal: Soft. He exhibits no distension. There is no tenderness.  Musculoskeletal: He exhibits no edema or tenderness.  Neurological: He is alert.  Skin: Skin is warm and dry.  Psychiatric: He has a normal mood and affect. His behavior is normal. Thought content normal.  Nursing note and vitals reviewed.    ED Treatments / Results  Labs (all labs ordered are listed, but only abnormal results are displayed) Labs Reviewed  URINALYSIS, ROUTINE W REFLEX MICROSCOPIC    EKG  EKG Interpretation None       Radiology No results found.  Procedures Procedures (including critical care time)  Medications Ordered in ED Medications - No data  to display   Initial Impression / Assessment and Plan / ED Course  I have reviewed the triage vital signs and the nursing notes.  Pertinent labs & imaging results that were available during my care of the patient were reviewed by me and considered in my medical decision making (see chart for details).     Suspect symptoms (heistancy, urgency, weak stream) from bph. UA ok. Bladder nonpalpable. No suprapubic tenderness. I doubt significant retention. Will try flomax. PCP or urology FU.   Final Clinical Impressions(s) / ED Diagnoses   Final diagnoses:    Benign prostatic hyperplasia with lower urinary tract symptoms, symptom details unspecified    ED Discharge Orders    None       Raeford RazorKohut, Voris Tigert, MD 02/12/17 1039

## 2017-02-10 NOTE — ED Triage Notes (Signed)
Pt reports having difficulty urinating since yesterday. Denies burning pain with urination but has mid abd discomfort.

## 2017-02-11 ENCOUNTER — Ambulatory Visit: Payer: Self-pay | Attending: Internal Medicine

## 2017-02-15 ENCOUNTER — Ambulatory Visit: Payer: Self-pay | Admitting: Family Medicine

## 2017-02-18 ENCOUNTER — Encounter: Payer: Self-pay | Admitting: Family Medicine

## 2017-02-18 ENCOUNTER — Ambulatory Visit (INDEPENDENT_AMBULATORY_CARE_PROVIDER_SITE_OTHER): Payer: Self-pay | Admitting: Family Medicine

## 2017-02-18 VITALS — BP 129/80 | HR 84 | Temp 98.5°F | Resp 16 | Ht 71.0 in | Wt 202.0 lb

## 2017-02-18 DIAGNOSIS — R339 Retention of urine, unspecified: Secondary | ICD-10-CM

## 2017-02-18 DIAGNOSIS — R7303 Prediabetes: Secondary | ICD-10-CM

## 2017-02-18 DIAGNOSIS — N419 Inflammatory disease of prostate, unspecified: Secondary | ICD-10-CM

## 2017-02-18 LAB — POCT GLYCOSYLATED HEMOGLOBIN (HGB A1C): HEMOGLOBIN A1C: 5.8

## 2017-02-18 MED ORDER — TAMSULOSIN HCL 0.4 MG PO CAPS: 0.4000 mg | ORAL_CAPSULE | Freq: Every day | ORAL | 5 refills | Status: DC

## 2017-02-18 NOTE — Progress Notes (Signed)
Subjective:     Kevin Little is a 46 y.o. male with a history of hypertension who presents for a follow up of urinary retention. Patient was evaluated in the emergency department on 02/10/2017 with complaints of urinary frequency and hesitancy. He felt that he was not emptying his bladder and was urinating every 1-2 hours. He also says that his urine stream was weak. He was started on daily Flomax and says that symptoms have improved. He currently denies fatigue, abdominal pain, dysuria, hematuria, urinary frequency, or urgency. He is requesting a prostate exam. He denies a family history of prostate cancer.  Past Medical History:  Diagnosis Date  . Herpes   . Herpes simplex   . Hypertension   . Sinus problem    Social History   Socioeconomic History  . Marital status: Married    Spouse name: Not on file  . Number of children: Not on file  . Years of education: Not on file  . Highest education level: Not on file  Social Needs  . Financial resource strain: Not on file  . Food insecurity - worry: Not on file  . Food insecurity - inability: Not on file  . Transportation needs - medical: Not on file  . Transportation needs - non-medical: Not on file  Occupational History  . Not on file  Tobacco Use  . Smoking status: Never Smoker  . Smokeless tobacco: Never Used  Substance and Sexual Activity  . Alcohol use: No  . Drug use: No  . Sexual activity: Not on file  Other Topics Concern  . Not on file  Social History Narrative   ** Merged History Encounter **      Review of Systems  Constitutional: Negative.   HENT: Negative.   Eyes: Negative.   Respiratory: Negative.   Cardiovascular: Negative.   Gastrointestinal: Negative.   Genitourinary: Negative for dysuria, flank pain, frequency, hematuria and urgency.  Musculoskeletal: Negative.   Skin: Negative for itching and rash.  Endo/Heme/Allergies: Negative.   Psychiatric/Behavioral: Negative.      Objective:  Physical Exam   Constitutional: He is oriented to person, place, and time. He appears well-developed and well-nourished.  Eyes: Pupils are equal, round, and reactive to light.  Cardiovascular: Normal rate, regular rhythm and normal heart sounds.  Pulmonary/Chest: Effort normal and breath sounds normal.  Abdominal: Soft. Bowel sounds are normal. He exhibits no distension.  Genitourinary: Rectum normal and penis normal. Rectal exam shows no external hemorrhoid, no internal hemorrhoid, no fissure, anal tone normal and guaiac negative stool. Prostate is enlarged. Prostate is not tender. No penile tenderness.  Neurological: He is alert and oriented to person, place, and time.  Skin: Skin is warm and dry.  Psychiatric: He has a normal mood and affect. His behavior is normal. Judgment and thought content normal.    Assessment:    Urinary retention due to prostatitis.   BP 129/80 (BP Location: Right Arm, Patient Position: Sitting, Cuff Size: Large)   Pulse 84   Temp 98.5 F (36.9 C) (Oral)   Resp 16   Ht 5\' 11"  (1.803 m)   Wt 202 lb (91.6 kg)   SpO2 99%   BMI 28.17 kg/m  Plan:   1. Urinary retention - Basic Metabolic Panel - PSA - tamsulosin (FLOMAX) 0.4 MG CAPS capsule; Take 1 capsule (0.4 mg total) by mouth daily.  Dispense: 30 capsule; Refill: 5  2. Prediabetes Hemoglobin a1C is 5.8, which is consistent with prediabetes. Recommend a lowfat, low carbohydrate  diet divided over 5-6 small meals, increase water intake to 6-8 glasses, and 150 minutes per week of cardiovascular exercise.   - HgB A1c  3. Prostatitis, unspecified prostatitis type Will continue Tamsulosin 0.4 mg daily   RTC: 3 months for hypertension   Nolon NationsLaChina Moore Anjela Cassara  MSN, FNP-C Patient Care [facility] [address]  [place], [place] [address] [phone]

## 2017-02-18 NOTE — Patient Instructions (Signed)
Hemoglobin a1c is elevated at 5.8, which is consistent with prediabetes. Recommend a lowfat, low carbohydrate diet divided over 5-6 small meals, increase water intake to 6-8 glasses, and 150 minutes per week of cardiovascular exercise.     Acute Urinary Retention, Male Acute urinary retention is when you are unable to pee (urinate). Acute urinary retention is common in older men. Prostates can get bigger, which blocks the flow of pee. Follow these instructions at home:  Drink enough fluids to keep your pee clear or pale yellow.  If you are sent home with a tube that drains the bladder (catheter), there will be a drainage bag attached to it. There are two types of bags. One is big that you can wear at night without having to empty it. One is smaller and needs to be emptied more often. ? Keep the drainage bag empty. ? Keep the drainage bag lower than your catheter.  Only take medicine as told by your doctor. Contact a doctor if:  You have a low-grade fever.  You have spasms or you are leaking pee when you have spasms. Get help right away if:  You have chills or a fever.  Your catheter stops draining pee.  Your catheter falls out.  You have increased bleeding that does not stop after you have rested and increased the amount of fluids you had been drinking. This information is not intended to replace advice given to you by your health care provider. Make sure you discuss any questions you have with your health care provider. Document Released: 08/22/2007 Document Revised: 08/11/2015 Document Reviewed: 08/14/2012 Elsevier Interactive Patient Education  2017 [employer]

## 2017-02-19 LAB — POCT URINALYSIS DIP (DEVICE)
Bilirubin Urine: NEGATIVE
Glucose, UA: NEGATIVE mg/dL
Ketones, ur: NEGATIVE mg/dL
LEUKOCYTES UA: NEGATIVE
NITRITE: NEGATIVE
PH: 7 (ref 5.0–8.0)
Protein, ur: NEGATIVE mg/dL
Specific Gravity, Urine: 1.015 (ref 1.005–1.030)
UROBILINOGEN UA: 0.2 mg/dL (ref 0.0–1.0)

## 2017-02-19 LAB — BASIC METABOLIC PANEL
BUN/Creatinine Ratio: 9 (ref 9–20)
BUN: 11 mg/dL (ref 6–24)
CALCIUM: 9.9 mg/dL (ref 8.7–10.2)
CHLORIDE: 98 mmol/L (ref 96–106)
CO2: 29 mmol/L (ref 20–29)
CREATININE: 1.18 mg/dL (ref 0.76–1.27)
GFR, EST AFRICAN AMERICAN: 85 mL/min/{1.73_m2} (ref >59)
GFR, EST NON AFRICAN AMERICAN: 74 mL/min/{1.73_m2} (ref >59)
Glucose: 86 mg/dL (ref 65–99)
Potassium: 3.7 mmol/L (ref 3.5–5.2)
Sodium: 141 mmol/L (ref 134–144)

## 2017-02-19 LAB — PSA: Prostate Specific Ag, Serum: 0.5 ng/mL (ref 0.0–4.0)

## 2017-02-25 ENCOUNTER — Other Ambulatory Visit: Payer: Self-pay | Admitting: Family Medicine

## 2017-02-25 DIAGNOSIS — I10 Essential (primary) hypertension: Secondary | ICD-10-CM

## 2017-02-27 MED FILL — LISINOPRIL-HCTZ 20-25 MG TA: 20-25 | 30 days supply | Qty: 30 | Fill #0

## 2017-03-27 ENCOUNTER — Ambulatory Visit (INDEPENDENT_AMBULATORY_CARE_PROVIDER_SITE_OTHER): Payer: Self-pay | Admitting: Family Medicine

## 2017-03-27 ENCOUNTER — Encounter: Payer: Self-pay | Admitting: Family Medicine

## 2017-03-27 VITALS — BP 138/80 | HR 74 | Temp 98.0°F | Resp 14 | Ht 71.0 in | Wt 206.0 lb

## 2017-03-27 DIAGNOSIS — F5232 Male orgasmic disorder: Secondary | ICD-10-CM

## 2017-03-27 DIAGNOSIS — R350 Frequency of micturition: Secondary | ICD-10-CM

## 2017-03-27 LAB — POCT URINALYSIS DIP (DEVICE)
BILIRUBIN URINE: NEGATIVE
GLUCOSE, UA: NEGATIVE mg/dL
Hgb urine dipstick: NEGATIVE
Ketones, ur: NEGATIVE mg/dL
LEUKOCYTES UA: NEGATIVE
NITRITE: NEGATIVE
Protein, ur: NEGATIVE mg/dL
Specific Gravity, Urine: 1.02 (ref 1.005–1.030)
UROBILINOGEN UA: 0.2 mg/dL (ref 0.0–1.0)
pH: 6.5 (ref 5.0–8.0)

## 2017-03-27 NOTE — Progress Notes (Signed)
Patient ID: Kevin Little, male    DOB: 1970/07/26, 47 y.o.   MRN: [id number]  PCP: [name], FNP  Chief Complaint  Patient presents with  . Elevated PSA    wants a referral for prostate    Subjective:  HPI Kevin Little is a 47 y.o. male presents for evaluation of urinary frequency and ejaculation concerns. Patient was diagnosed with prostatitis last month. He reports since this time, his urine frequency has worsened to the point he is urinating every hour.  He also reports a concern that ejaculation volume has diminished and thinned in correlation with urinary frequency . He is concerned that all of his symptoms are related to enlargement of his prostate. Rectal exam performed last month was significant for prostate enlargement in the presence of a normal PSA level. He is requesting a urology referral for a second opinion of the underlying cause of his symptoms. He denies low back pain , flank pain, abdominal pain, scrotal enlarge/discomfort, or penile discharge. Social History   Socioeconomic History  . Marital status: Married    Spouse name: Not on file  . Number of children: Not on file  . Years of education: Not on file  . Highest education level: Not on file  Social Needs  . Financial resource strain: Not on file  . Food insecurity - worry: Not on file  . Food insecurity - inability: Not on file  . Transportation needs - medical: Not on file  . Transportation needs - non-medical: Not on file  Occupational History  . Not on file  Tobacco Use  . Smoking status: Never Smoker  . Smokeless tobacco: Never Used  Substance and Sexual Activity  . Alcohol use: No  . Drug use: No  . Sexual activity: Not on file  Other Topics Concern  . Not on file  Social History Narrative   ** Merged History Encounter **        Family History  Problem Relation Age of Onset  . Hypertension Mother   . Cancer Father        prostate    Review of Systems  Respiratory: Negative.    Cardiovascular: Negative.   Gastrointestinal: Negative.   Genitourinary: Positive for frequency. Negative for decreased urine volume, discharge, dysuria, genital sores, hematuria, penile pain, penile swelling, scrotal swelling, testicular pain and urgency.  Musculoskeletal: Negative.     Patient Active Problem List   Diagnosis Date Noted  . Need for immunization against influenza 01/04/2014  . Need for Tdap vaccination 10/04/2013  . Midline low back pain without sciatica 10/04/2013  . CHEST PAIN 02/16/2010  . DERMATITIS DUE DRUGS&MEDICINES TAKEN INTERNALLY 08/05/2008  . Essential hypertension 01/21/2007  . GASTRITIS 01/21/2007  . ARTHRITIS 01/21/2007    Allergies  Allergen Reactions  . Omeprazole Rash  . Rabeprazole Sodium Rash    ( Aciphex)    Prior to Admission medications   Medication Sig Start Date End Date Taking? Authorizing Provider  lisinopril-hydrochlorothiazide (PRINZIDE,ZESTORETIC) 20-25 MG tablet TAKE 1 TABLET BY MOUTH DAILY. 02/27/17   [name], FNP  tamsulosin (FLOMAX) 0.4 MG CAPS capsule Take 1 capsule (0.4 mg total) by mouth daily. 02/18/17   [name], FNP    Past Medical, Surgical Family and Social History reviewed and updated.    Objective:   Today's Vitals   03/27/17 0842  BP: 138/80  Pulse: 74  Resp: 14  Temp: 98 F (36.7 C)  TempSrc: Oral  SpO2: 100%  Weight: 206  lb (93.4 kg)  Height: 5\' 11"  (1.803 m)    Wt Readings from Last 3 Encounters:  03/27/17 206 lb (93.4 kg)  02/18/17 202 lb (91.6 kg)  02/10/17 192 lb (87.1 kg)    Physical Exam  Constitutional: He is oriented to person, place, and time. He appears well-developed and well-nourished.  HENT:  Head: Normocephalic and atraumatic.  Eyes: Conjunctivae are normal. Pupils are equal, round, and reactive to light.  Neck: Normal range of motion. Neck supple.  Cardiovascular: Normal rate, regular rhythm, normal heart sounds and intact distal pulses.  Pulmonary/Chest:  Effort normal and breath sounds normal.  Genitourinary:  Genitourinary Comments: Deferred rectal exam, as patient was noted to have an enlarged prostate less than 1 month prior  Lymphadenopathy:    He has no cervical adenopathy.  Neurological: He is alert and oriented to person, place, and time.  Skin: Skin is warm and dry.  Psychiatric: He has a normal mood and affect. His behavior is normal. Thought content normal.   Assessment & Plan:  1. Urinary frequency 2. Delayed ejaculation  Patient has complained of symptoms of urinary retention, decreased urine stream, urinary frequency recurring over the last 6 months.  He has been treated for BPH and prostatitis without significant relief of symptoms.  Patient is requesting a referral to urology for further evaluation and management.  Will refer to [facility] for further workup and a second opinion.   Godfrey PickKimberly S. Tiburcio PeaHarris, MSN, FNP-C The Patient Care [facility]  [address] [place], [place] [address] [phone]

## 2017-03-27 NOTE — Patient Instructions (Signed)
Keep follow-up with Armeniahina. If you experience any prolonged urinary retention, blood in urine, or worsening lower back or lower abdominal pain. Return for care.  You will receive a call from [facility] to schedule an appointment for your specialty evaluation.    Acute Urinary Retention, Male Acute urinary retention is when you are unable to pee (urinate). Acute urinary retention is common in older men. Prostates can get bigger, which blocks the flow of pee. Follow these instructions at home:  Drink enough fluids to keep your pee clear or pale yellow.  If you are sent home with a tube that drains the bladder (catheter), there will be a drainage bag attached to it. There are two types of bags. One is big that you can wear at night without having to empty it. One is smaller and needs to be emptied more often. ? Keep the drainage bag empty. ? Keep the drainage bag lower than your catheter.  Only take medicine as told by your doctor. Contact a doctor if:  You have a low-grade fever.  You have spasms or you are leaking pee when you have spasms. Get help right away if:  You have chills or a fever.  Your catheter stops draining pee.  Your catheter falls out.  You have increased bleeding that does not stop after you have rested and increased the amount of fluids you had been drinking. This information is not intended to replace advice given to you by your health care provider. Make sure you discuss any questions you have with your health care provider. Document Released: 08/22/2007 Document Revised: 08/11/2015 Document Reviewed: 08/14/2012 Elsevier Interactive Patient Education  2017 [employer]

## 2017-04-03 MED FILL — LISINOPRIL-HCTZ 20-25 MG TA: 20-25 | 30 days supply | Qty: 30 | Fill #1

## 2017-04-04 ENCOUNTER — Telehealth: Payer: Self-pay | Admitting: Family Medicine

## 2017-04-04 NOTE — Telephone Encounter (Signed)
Patient stopped by requesting to speak with financial, regarding orange/blue card, and bills. Please fu with patient.

## 2017-04-15 ENCOUNTER — Ambulatory Visit (INDEPENDENT_AMBULATORY_CARE_PROVIDER_SITE_OTHER): Payer: Self-pay | Admitting: Urology

## 2017-04-15 ENCOUNTER — Encounter: Payer: Self-pay | Admitting: Urology

## 2017-04-15 VITALS — BP 131/75 | HR 76 | Ht 70.0 in | Wt 212.0 lb

## 2017-04-15 DIAGNOSIS — R350 Frequency of micturition: Secondary | ICD-10-CM

## 2017-04-15 DIAGNOSIS — R399 Unspecified symptoms and signs involving the genitourinary system: Secondary | ICD-10-CM

## 2017-04-15 LAB — MICROSCOPIC EXAMINATION

## 2017-04-15 LAB — URINALYSIS, COMPLETE
Bilirubin, UA: NEGATIVE
GLUCOSE, UA: NEGATIVE
Ketones, UA: NEGATIVE
LEUKOCYTES UA: NEGATIVE
NITRITE UA: NEGATIVE
Protein, UA: NEGATIVE
Specific Gravity, UA: 1.02 (ref 1.005–1.030)
Urobilinogen, Ur: 0.2 mg/dL (ref 0.2–1.0)
pH, UA: 7 (ref 5.0–7.5)

## 2017-04-15 LAB — BLADDER SCAN AMB NON-IMAGING

## 2017-04-15 NOTE — Progress Notes (Signed)
04/15/2017 10:02 AM   Kevin GurneyFrancois Alles 12/27/1970 [id number]  Referring provider: Massie MaroonHollis, Lachina M, FNP [address] [place], [place] [id number]  Chief Complaint  Patient presents with  . Urinary Frequency    New patient    HPI: Kevin Little is a 47 year old male seen in consultation at the request of Joaquin CourtsKimberly Harris for evaluation of lower urinary tract symptoms.  He was seen in the ED on 02/10/2017 complaining of a several month history of worsening urinary hesitancy, frequency and urgency with decreased force and caliber of his urinary stream.  Urinalysis was unremarkable and he was started on tamsulosin.  He did note improvement in his lower urinary tract symptoms.  He saw his primary provider on 03/27/2017 complaining of recurrent symptoms and requested a urology referral.  He was started back on tamsulosin and has seen improvement in his symptoms.  He notes mild dysuria at times.  A PSA was 0.5.  He is presently satisfied with his voiding pattern.   PMH: Past Medical History:  Diagnosis Date  . Herpes   . Herpes simplex   . Hypertension   . Sinus problem     Surgical History: No past surgical history on file.  Home Medications:  Allergies as of 04/15/2017      Reactions   Omeprazole Rash   Rabeprazole Sodium Rash   ( Aciphex)      Medication List        Accurate as of 04/15/17 10:02 AM. Always use your most recent med list.          lisinopril-hydrochlorothiazide 20-25 MG tablet Commonly known as:  PRINZIDE,ZESTORETIC TAKE 1 TABLET BY MOUTH DAILY.   tamsulosin 0.4 MG Caps capsule Commonly known as:  FLOMAX Take 1 capsule (0.4 mg total) by mouth daily.       Allergies:  Allergies  Allergen Reactions  . Omeprazole Rash  . Rabeprazole Sodium Rash    ( Aciphex)    Family History: Family History  Problem Relation Age of Onset  . Hypertension Mother   . Cancer Father        prostate    Social History:  reports that  has never smoked.  he has never used smokeless tobacco. He reports that he does not drink alcohol or use drugs.  ROS: UROLOGY Frequent Urination?: Yes Hard to postpone urination?: Yes Burning/pain with urination?: Yes Get up at night to urinate?: No Leakage of urine?: No Urine stream starts and stops?: Yes Trouble starting stream?: Yes Do you have to strain to urinate?: Yes Blood in urine?: No Urinary tract infection?: Yes Sexually transmitted disease?: Yes Injury to kidneys or bladder?: No Painful intercourse?: No Weak stream?: No Erection problems?: No Penile pain?: Yes  Gastrointestinal Nausea?: No Vomiting?: No Indigestion/heartburn?: Yes Diarrhea?: No Constipation?: Yes  Constitutional Fever: No Night sweats?: No Weight loss?: No Fatigue?: No  Skin Skin rash/lesions?: No Itching?: Yes  Eyes Blurred vision?: Yes Double vision?: No  Ears/Nose/Throat Sore throat?: No Sinus problems?: Yes  Hematologic/Lymphatic Swollen glands?: No Easy bruising?: No  Cardiovascular Leg swelling?: No Chest pain?: Yes  Respiratory Cough?: No Shortness of breath?: No  Endocrine Excessive thirst?: No  Musculoskeletal Back pain?: Yes Joint pain?: No  Neurological Headaches?: No Dizziness?: No  Psychologic Depression?: No Anxiety?: No  Physical Exam: BP 131/75   Pulse 76   Ht 5\' 10"  (1.778 m)   Wt 212 lb (96.2 kg)   BMI 30.42 kg/m   Constitutional:  Alert and oriented,  No acute distress. HEENT: [place] AT, moist mucus membranes.  Trachea midline, no masses. Cardiovascular: No clubbing, cyanosis, or edema. Respiratory: Normal respiratory effort, no increased work of breathing. GI: Abdomen is soft, nontender, nondistended, no abdominal masses GU: No CVA tenderness. Penis without lesions, testes descended bilaterally without masses or tenderness.  Cord/epididymes palpably normal.  Prostate 30 g, smooth without nodules Skin: No rashes, bruises or suspicious lesions. Lymph: No  cervical or inguinal adenopathy. Neurologic: Grossly intact, no focal deficits, moving all 4 extremities. Psychiatric: Normal mood and affect.  Laboratory Data: Lab Results  Component Value Date   WBC 3.8 10/30/2016   HGB 15.2 10/30/2016   HCT 44.7 10/30/2016   MCV 85.6 10/30/2016   PLT 208 10/30/2016    Lab Results  Component Value Date   CREATININE 1.18 02/18/2017    Lab Results  Component Value Date   PSA1 0.5 02/18/2017     Lab Results  Component Value Date   HGBA1C 5.8 02/18/2017    Urinalysis Dipstick/microscopy negative   Assessment & Plan:    1.  Lower urinary tract symptoms We discussed potential causes including BPH and inflammatory prostatitis.  PVR by bladder scan today was 15 mL.  He is currently doing well on tamsulosin and desires to continue.  He may want to try and discontinue and 2 months and may restart if his voiding symptoms recur.  He will follow-up for any worsening of his symptoms.  - Urinalysis, Complete - BLADDER SCAN AMB NON-IMAGING  Return if symptoms worsen or fail to improve.    Riki Altes, MD  [facility] [address] [place], [place] [address] [phone]

## 2017-05-02 ENCOUNTER — Ambulatory Visit (INDEPENDENT_AMBULATORY_CARE_PROVIDER_SITE_OTHER): Payer: Self-pay | Admitting: Family Medicine

## 2017-05-02 ENCOUNTER — Encounter: Payer: Self-pay | Admitting: Family Medicine

## 2017-05-02 VITALS — BP 96/42 | Temp 98.1°F | Resp 16 | Ht 70.0 in | Wt 208.0 lb

## 2017-05-02 DIAGNOSIS — R0789 Other chest pain: Secondary | ICD-10-CM

## 2017-05-02 DIAGNOSIS — B009 Herpesviral infection, unspecified: Secondary | ICD-10-CM

## 2017-05-02 DIAGNOSIS — K224 Dyskinesia of esophagus: Secondary | ICD-10-CM

## 2017-05-02 DIAGNOSIS — I10 Essential (primary) hypertension: Secondary | ICD-10-CM

## 2017-05-02 LAB — POCT URINALYSIS DIP (DEVICE)
BILIRUBIN URINE: NEGATIVE
Glucose, UA: NEGATIVE mg/dL
HGB URINE DIPSTICK: NEGATIVE
Ketones, ur: NEGATIVE mg/dL
Leukocytes, UA: NEGATIVE
NITRITE: NEGATIVE
PH: 5.5 (ref 5.0–8.0)
Protein, ur: NEGATIVE mg/dL
Specific Gravity, Urine: 1.025 (ref 1.005–1.030)
Urobilinogen, UA: 0.2 mg/dL (ref 0.0–1.0)

## 2017-05-02 NOTE — Patient Instructions (Addendum)
Refrain from taking OTC supplements.  Return in 1 week for blood pressure check Recommend a trial of Famotidine 20 mg at bedtime for chest discomfort.     Hypertension Hypertension is another name for high blood pressure. High blood pressure forces your heart to work harder to pump blood. This can cause problems over time. There are two numbers in a blood pressure reading. There is a top number (systolic) over a bottom number (diastolic). It is best to have a blood pressure below 120/80. Healthy choices can help lower your blood pressure. You may need medicine to help lower your blood pressure if:  Your blood pressure cannot be lowered with healthy choices.  Your blood pressure is higher than 130/80.  Follow these instructions at home: Eating and drinking  If directed, follow the DASH eating plan. This diet includes: ? Filling half of your plate at each meal with fruits and vegetables. ? Filling one quarter of your plate at each meal with whole grains. Whole grains include whole wheat pasta, brown rice, and whole grain bread. ? Eating or drinking low-fat dairy products, such as skim milk or low-fat yogurt. ? Filling one quarter of your plate at each meal with low-fat (lean) proteins. Low-fat proteins include fish, skinless chicken, eggs, beans, and tofu. ? Avoiding fatty meat, cured and processed meat, or chicken with skin. ? Avoiding premade or processed food.  Eat less than 1,500 mg of salt (sodium) a day.  Limit alcohol use to no more than 1 drink a day for nonpregnant women and 2 drinks a day for men. One drink equals 12 oz of beer, 5 oz of wine, or 1 oz of hard liquor. Lifestyle  Work with your doctor to stay at a healthy weight or to lose weight. Ask your doctor what the best weight is for you.  Get at least 30 minutes of exercise that causes your heart to beat faster (aerobic exercise) most days of the week. This may include walking, swimming, or biking.  Get at least 30  minutes of exercise that strengthens your muscles (resistance exercise) at least 3 days a week. This may include lifting weights or pilates.  Do not use any products that contain nicotine or tobacco. This includes cigarettes and e-cigarettes. If you need help quitting, ask your doctor.  Check your blood pressure at home as told by your doctor.  Keep all follow-up visits as told by your doctor. This is important. Medicines  Take over-the-counter and prescription medicines only as told by your doctor. Follow directions carefully.  Do not skip doses of blood pressure medicine. The medicine does not work as well if you skip doses. Skipping doses also puts you at risk for problems.  Ask your doctor about side effects or reactions to medicines that you should watch for. Contact a doctor if:  You think you are having a reaction to the medicine you are taking.  You have headaches that keep coming back (recurring).  You feel dizzy.  You have swelling in your ankles.  You have trouble with your vision. Get help right away if:  You get a very bad headache.  You start to feel confused.  You feel weak or numb.  You feel faint.  You get very bad pain in your: ? Chest. ? Belly (abdomen).  You throw up (vomit) more than once.  You have trouble breathing. Summary  Hypertension is another name for high blood pressure.  Making healthy choices can help lower blood pressure. If  your blood pressure cannot be controlled with healthy choices, you may need to take medicine. This information is not intended to replace advice given to you by your health care provider. Make sure you discuss any questions you have with your health care provider. Document Released: 08/22/2007 Document Revised: 02/01/2016 Document Reviewed: 02/01/2016 Elsevier Interactive Patient Education  [employer].

## 2017-05-03 ENCOUNTER — Telehealth: Payer: Self-pay

## 2017-05-03 LAB — BASIC METABOLIC PANEL
BUN/Creatinine Ratio: 13 (ref 9–20)
BUN: 13 mg/dL (ref 6–24)
CALCIUM: 9.6 mg/dL (ref 8.7–10.2)
CHLORIDE: 101 mmol/L (ref 96–106)
CO2: 25 mmol/L (ref 20–29)
Creatinine, Ser: 1.02 mg/dL (ref 0.76–1.27)
GFR calc non Af Amer: 87 mL/min/{1.73_m2} (ref >59)
GFR, EST AFRICAN AMERICAN: 101 mL/min/{1.73_m2} (ref >59)
GLUCOSE: 90 mg/dL (ref 65–99)
Potassium: 4 mmol/L (ref 3.5–5.2)
Sodium: 143 mmol/L (ref 134–144)

## 2017-05-03 NOTE — Telephone Encounter (Signed)
-----   Message from Massie MaroonLachina M Hollis, [place] sent at 05/03/2017  6:02 AM EST ----- Regarding: lab results Please inform patient that all labs are within a normal range. Remind patient to discontinue herbal medications and return for bp check as scheduled.   Thanks

## 2017-05-03 NOTE — Telephone Encounter (Signed)
Called and spoke with patient. Advised that all labs are within normal range and that patient should discontinue herbal medications and return in bp check as scheduled. Patient verbalized understanding. Thanks!

## 2017-05-05 MED ORDER — FAMOTIDINE 20 MG PO TABS: 20.0000 mg | ORAL_TABLET | Freq: Every day | ORAL | 0 refills | Status: DC

## 2017-05-05 NOTE — Progress Notes (Signed)
Subjective:    Patient ID: Kevin Little, male    DOB: 06/07/1970, 47 y.o.   MRN: [id number]  HPI  Kevin GurneyFrancois Siess, a 47 year old male with a history of hypertension and chest discomfort. Mr. Daryel GeraldKouame has been taking lisinopril-hydrochlorothiazide consistently over the past several years. Blood pressure has been consistently at goal. He does not exercise. He follows a low fat diet. He has also started supplements sent from [place] to assist with lowering blood pressure and blood sugar. He is also taking a supplement to prevent HSV outbreaks.   Patient is complaining of chest discomfort over the past several years. He has had an EKG and chest xray, which were both within normal limits. Patient describes pain as internal discomfort, he has not identified any palliative or provocative factors associated with chest discomfort.He denies abdominal bloating, belching, choking on food, cough, deep pressure at base of neck, dysphagia, early satiety, fullness after meals, hematemesis, hoarseness, nausea, need to clear throat frequently, nocturnal burning and regurgitation of undigested food. [employer]. He denies dysphagia.  He has not lost weight. He denies melena, hematochezia, hematemesis, and coffee ground emesis.    Past Medical History:  Diagnosis Date  . Herpes   . Herpes simplex   . Hypertension   . Sinus problem    Social History   Socioeconomic History  . Marital status: Married    Spouse name: Not on file  . Number of children: Not on file  . Years of education: Not on file  . Highest education level: Not on file  Social Needs  . Financial resource strain: Not on file  . Food insecurity - worry: Not on file  . Food insecurity - inability: Not on file  . Transportation needs - medical: Not on file  . Transportation needs - non-medical: Not on file  Occupational History  . Not on file  Tobacco Use  . Smoking status: Never Smoker  . Smokeless tobacco: Never Used  Substance and  Sexual Activity  . Alcohol use: No  . Drug use: No  . Sexual activity: Not on file  Other Topics Concern  . Not on file  Social History Narrative   ** Merged History Encounter **       Immunization History  Administered Date(s) Administered  . Influenza,inj,Quad PF,6+ Mos 01/04/2014, 05/09/2015  . Tdap 09/30/2013    Review of Systems  Constitutional: Negative.   HENT: Negative.   Eyes: Negative for photophobia and redness.  Respiratory: Negative for cough and choking.   Cardiovascular: Negative.        Chest discomfort  Gastrointestinal: Negative for abdominal distention, abdominal pain, anal bleeding, blood in stool, constipation, diarrhea, nausea and rectal pain.  Endocrine: Negative for polydipsia, polyphagia and polyuria.  Genitourinary: Negative.   Musculoskeletal: Negative.   Skin: Negative.   Neurological: Negative.   Hematological: Negative.        Objective:   Physical Exam  Constitutional: He is oriented to person, place, and time. He appears well-developed and well-nourished.  HENT:  Head: Normocephalic and atraumatic.  Right Ear: External ear normal.  Left Ear: External ear normal.  Nose: Nose normal.  Mouth/Throat: Oropharynx is clear and moist.  Eyes: Pupils are equal, round, and reactive to light.  Neck: Normal range of motion. Neck supple.  Cardiovascular: Normal rate, regular rhythm, normal heart sounds and intact distal pulses.  Pulmonary/Chest: Effort normal and breath sounds normal.  Abdominal: Soft. Bowel sounds are normal.  Neurological: He is  alert and oriented to person, place, and time. He has normal reflexes.  Skin: Skin is warm and dry.  Psychiatric: He has a normal mood and affect. His behavior is normal. Judgment and thought content normal.      BP (!) 96/42 Comment: manually  Temp 98.1 F (36.7 C) (Oral)   Resp 16   Ht 5\' 10"  (1.778 m)   Wt 208 lb (94.3 kg)   SpO2 100%   BMI 29.84 kg/m  Assessment & Plan:  1. Essential  hypertension Discussed blood pressure at length, which is low. Recommend that patient refrain from taking OTC supplements.  Will return in 1 week for a blood pressure check - POCT urinalysis dip (device) - Basic Metabolic Panel  2. Esophageal spasm I suspect that chest discomfort is related to GERD. He denies radiation of chest pain. Recommend gastroenterology consult. Will start a trial of omeprazole to help with this issue.    3. Discomfort in chest Recommend small frequent meals throughout the day.   4. Herpes simplex - HSV(herpes simplex vrs) 1+2 ab-IgG   RTC: 1 week for bp check  The patient was given clear instructions to go to ER or return to medical center if symptoms do not improve, worsen or new problems develop. The patient verbalized understanding. Will notify patient with laboratory results.   Nolon Nations  MSN, FNP-C Patient Care [facility] [address] [place], [place] [address] [phone]

## 2017-05-06 MED FILL — ?FAMOTIDINE 20 MG TABLET: 20 | 30 days supply | Qty: 30 | Fill #0

## 2017-05-07 MED FILL — LISINOPRIL-HCTZ 20-25 MG TA: 20-25 | 30 days supply | Qty: 30 | Fill #2

## 2017-05-09 ENCOUNTER — Ambulatory Visit (INDEPENDENT_AMBULATORY_CARE_PROVIDER_SITE_OTHER): Payer: Self-pay | Admitting: Family Medicine

## 2017-05-09 VITALS — BP 112/72

## 2017-05-09 DIAGNOSIS — I10 Essential (primary) hypertension: Secondary | ICD-10-CM

## 2017-05-09 NOTE — Progress Notes (Signed)
Patient came in for bp check today. Reading 112/72 manually. Informed patient to continue NOT taking the herbal supplement and keep next scheduled appointment. Thanks!

## 2017-05-19 DIAGNOSIS — I10 Essential (primary) hypertension: Secondary | ICD-10-CM | POA: Insufficient documentation

## 2017-05-19 DIAGNOSIS — Z79899 Other long term (current) drug therapy: Secondary | ICD-10-CM | POA: Insufficient documentation

## 2017-05-19 DIAGNOSIS — Y929 Unspecified place or not applicable: Secondary | ICD-10-CM | POA: Insufficient documentation

## 2017-05-19 DIAGNOSIS — Y998 Other external cause status: Secondary | ICD-10-CM | POA: Insufficient documentation

## 2017-05-19 DIAGNOSIS — Y9389 Activity, other specified: Secondary | ICD-10-CM | POA: Insufficient documentation

## 2017-05-19 DIAGNOSIS — S0993XA Unspecified injury of face, initial encounter: Secondary | ICD-10-CM | POA: Insufficient documentation

## 2017-05-20 ENCOUNTER — Encounter (HOSPITAL_COMMUNITY): Payer: Self-pay | Admitting: *Deleted

## 2017-05-20 ENCOUNTER — Emergency Department (HOSPITAL_COMMUNITY)
Admission: EM | Admit: 2017-05-20 | Discharge: 2017-05-20 | Disposition: A | Discharge status: Home / Self Care | Payer: Self-pay | Attending: Emergency Medicine | Admitting: Emergency Medicine

## 2017-05-20 ENCOUNTER — Emergency Department (HOSPITAL_COMMUNITY): Payer: Self-pay

## 2017-05-20 DIAGNOSIS — S0993XA Unspecified injury of face, initial encounter: Secondary | ICD-10-CM

## 2017-05-20 MED ORDER — HYDROCODONE-ACETAMINOPHEN 5-325 MG PO TABS
1.0000 | ORAL_TABLET | Freq: Once | ORAL | Status: AC
  Administered 2017-05-20: 1 via ORAL
  Filled 2017-05-20: qty 1

## 2017-05-20 NOTE — ED Notes (Signed)
Patient transported to CT 
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
## 2017-05-28 IMAGING — CR DG THORACIC SPINE 3V
3 series · 3 of 3 positions shown · non-contrast
Comparison: None.

CLINICAL DATA: Right-sided thoracic back pain.

EXAM:
THORACIC SPINE - 3 VIEWS

[t t-spine a.p.]
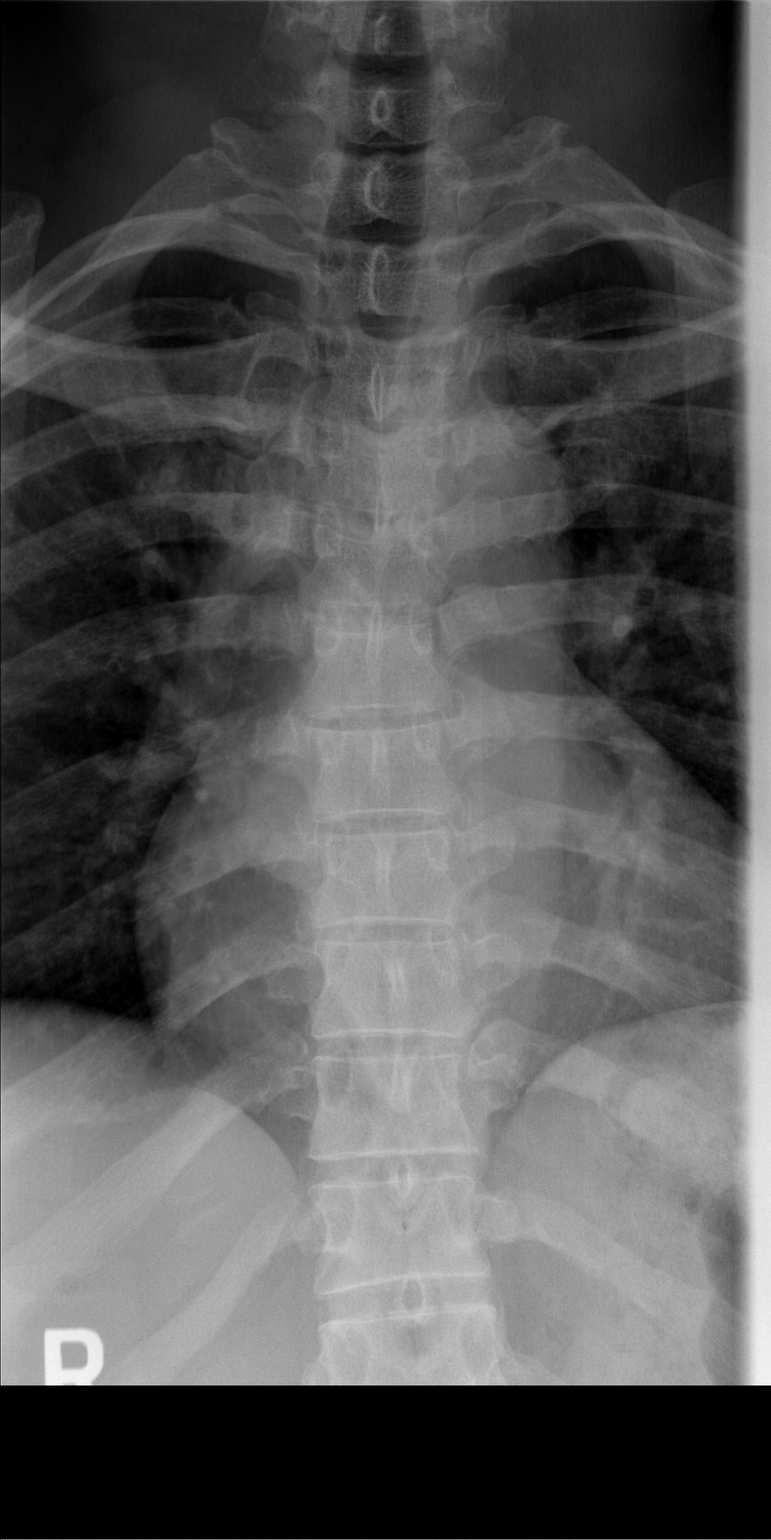

[t t-spine lat]
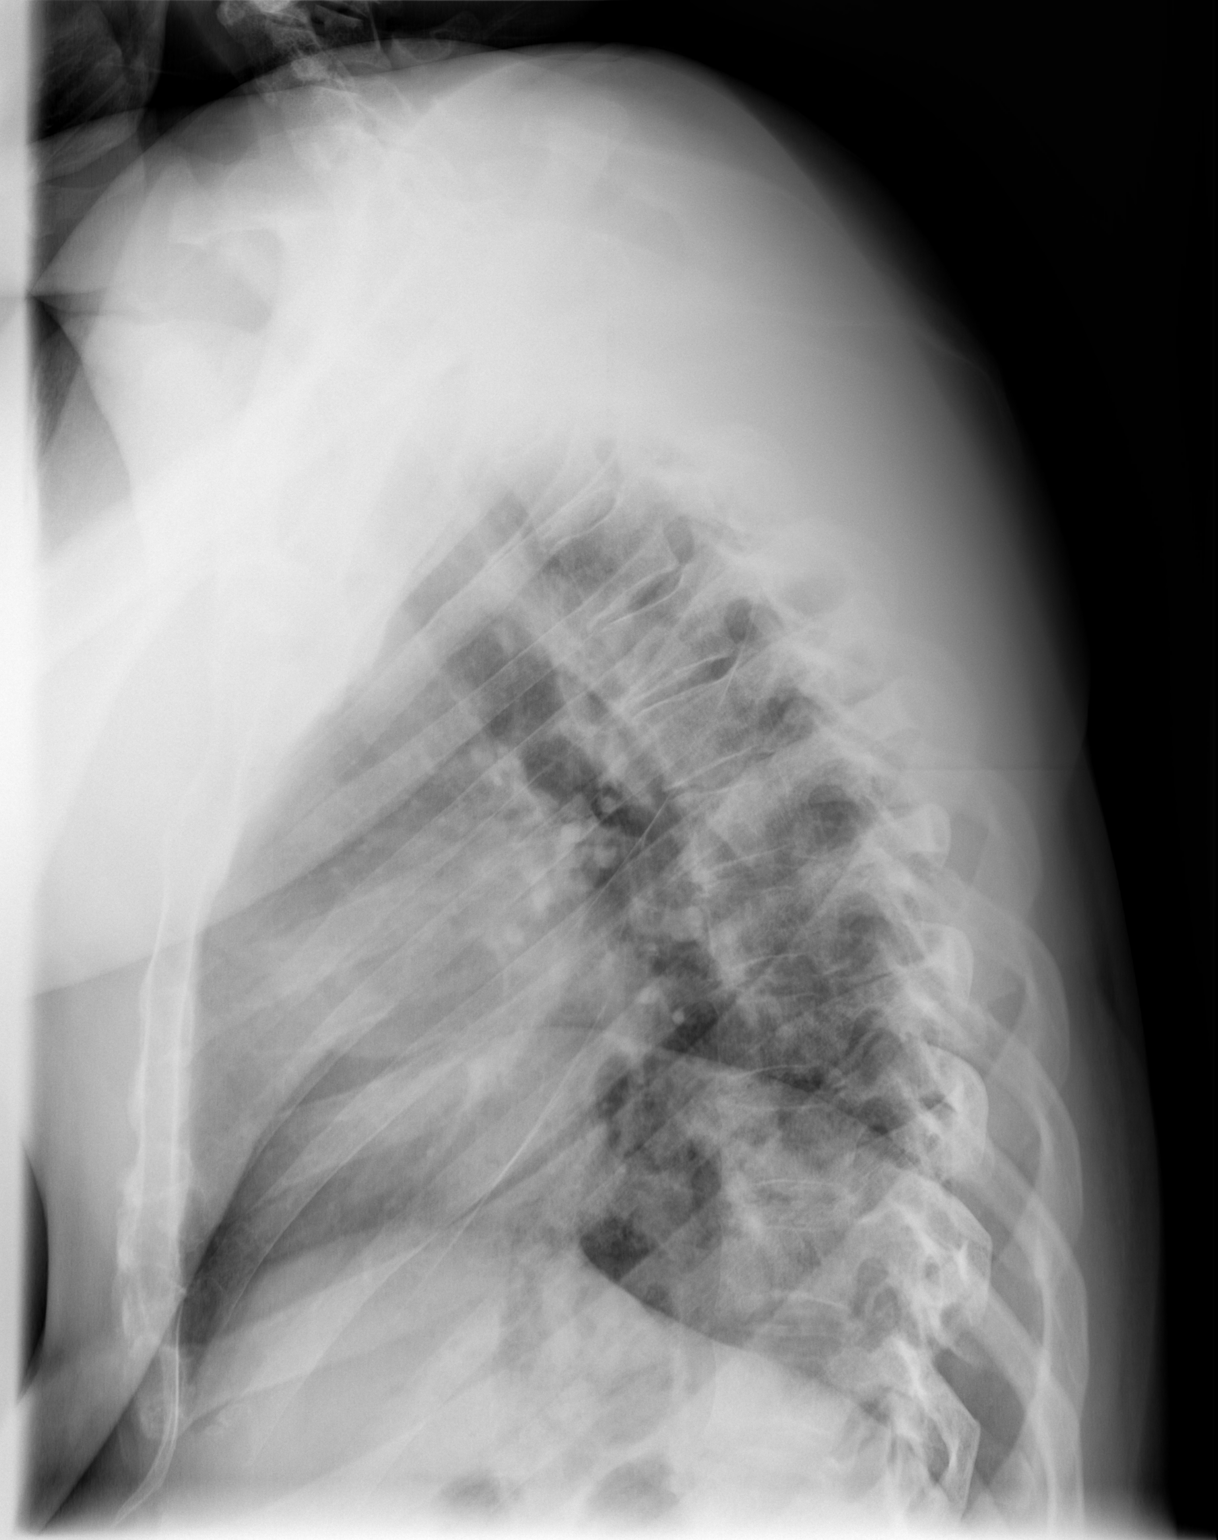

[t swimmers *]
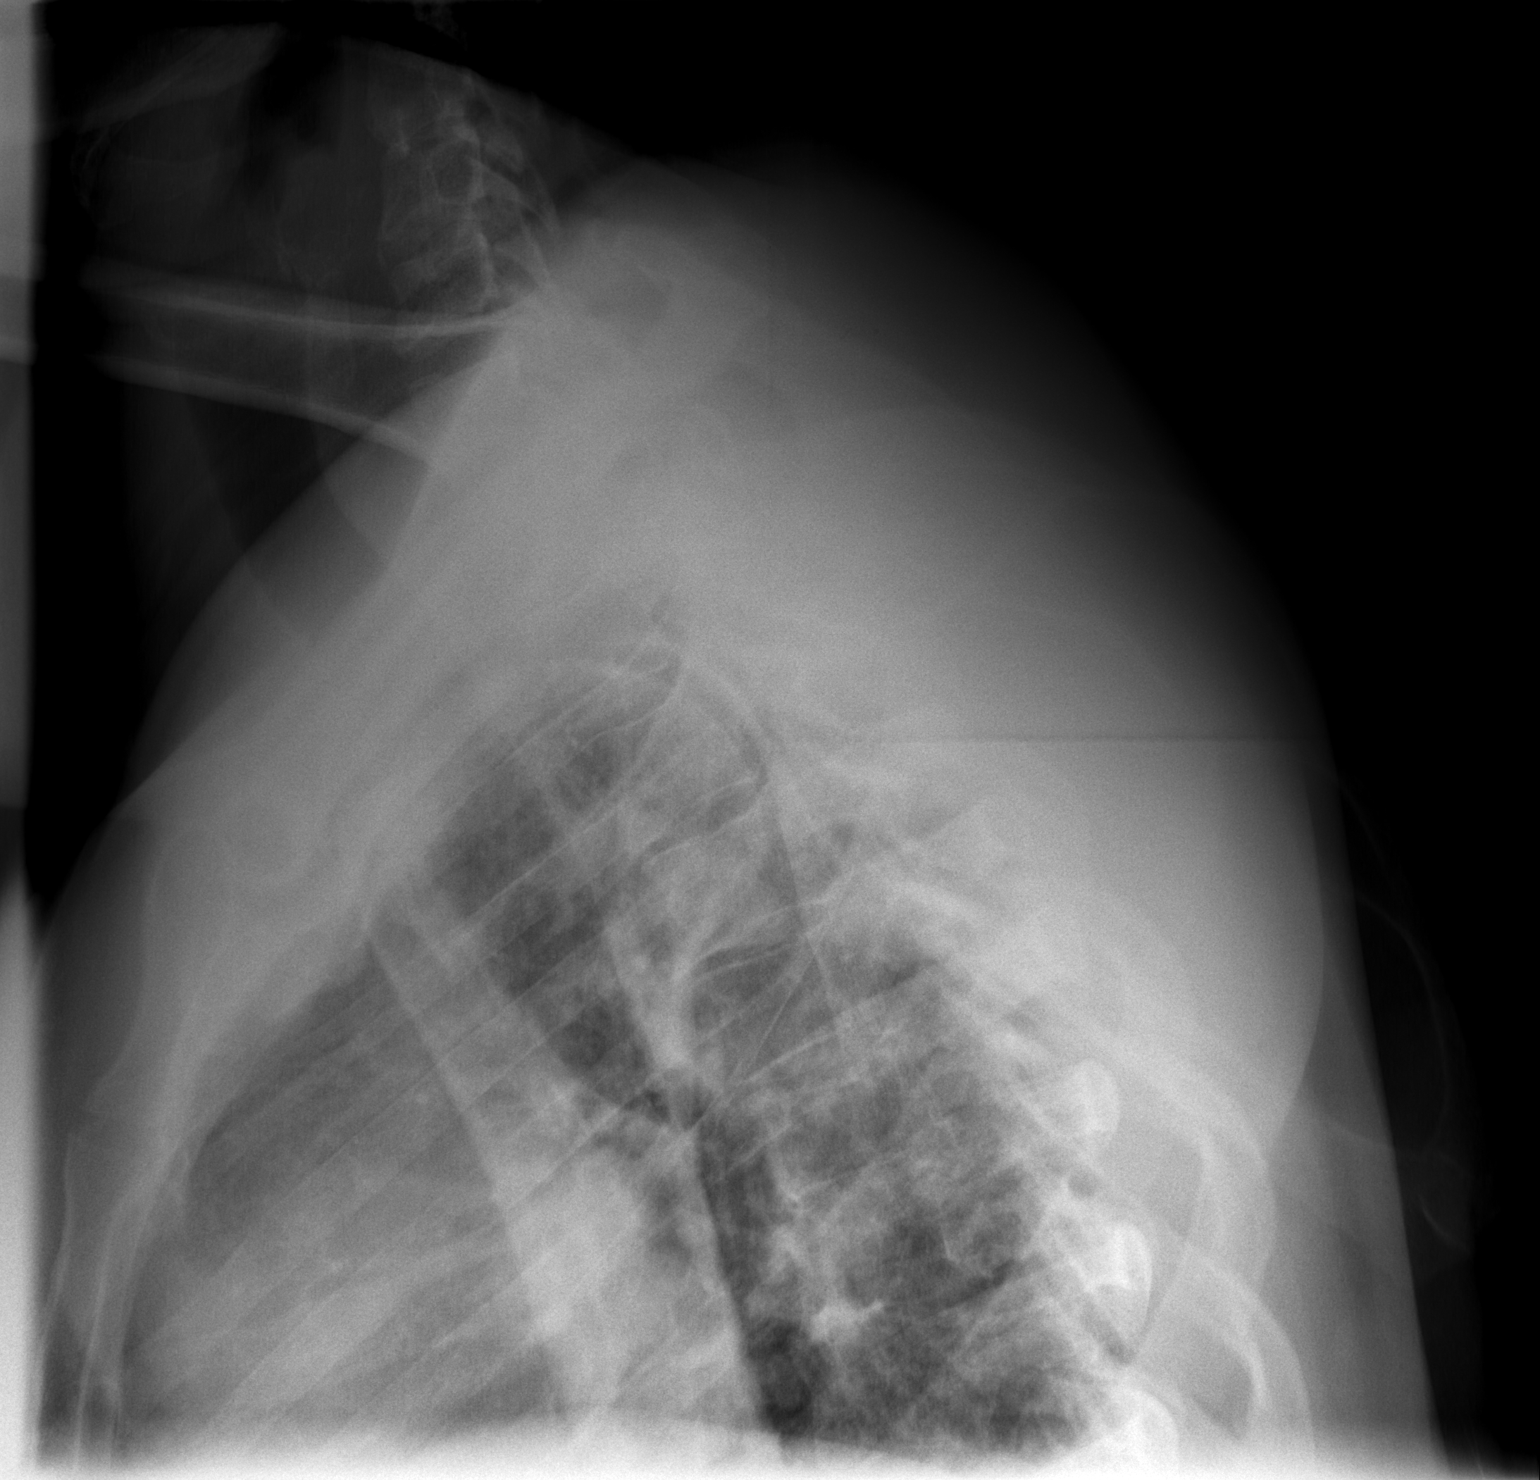

[3 of 3 positions shown; findings below may reference images not displayed]

FINDINGS: There is no evidence of thoracic spine fracture. Alignment is
normal. No other significant bone abnormalities are identified.
IMPRESSION: Negative.

## 2017-06-03 ENCOUNTER — Ambulatory Visit: Payer: Self-pay | Attending: Family Medicine

## 2017-06-06 MED FILL — LISINOPRIL-HCTZ 20-25 MG TA: 20-25 | 30 days supply | Qty: 30 | Fill #3

## 2017-07-24 MED FILL — LISINOPRIL-HCTZ 20-25 MG TA: 20-25 | 30 days supply | Qty: 30 | Fill #4

## 2017-09-06 ENCOUNTER — Other Ambulatory Visit: Payer: Self-pay | Admitting: Family Medicine

## 2017-09-06 DIAGNOSIS — I10 Essential (primary) hypertension: Secondary | ICD-10-CM

## 2017-09-06 DIAGNOSIS — R0789 Other chest pain: Secondary | ICD-10-CM

## 2017-09-06 MED FILL — FAMOTIDINE 20 MG TABLET: 20 | 30 days supply | Qty: 30 | Fill #0

## 2017-09-06 MED FILL — LISINOPRIL-HCTZ 20-25 MG TA: 20-25 | 30 days supply | Qty: 30 | Fill #0

## 2017-10-10 MED FILL — ?LISINOPRIL-HCTZ 20/25 TAB: 20-25 | 30 days supply | Qty: 30 | Fill #1

## 2017-10-14 ENCOUNTER — Ambulatory Visit (INDEPENDENT_AMBULATORY_CARE_PROVIDER_SITE_OTHER): Payer: Self-pay | Admitting: Family Medicine

## 2017-10-14 ENCOUNTER — Other Ambulatory Visit: Payer: Self-pay | Admitting: Family Medicine

## 2017-10-14 ENCOUNTER — Encounter: Payer: Self-pay | Admitting: Family Medicine

## 2017-10-14 VITALS — BP 134/87 | HR 78 | Temp 98.6°F | Resp 16 | Ht 70.0 in | Wt 208.0 lb

## 2017-10-14 DIAGNOSIS — Z113 Encounter for screening for infections with a predominantly sexual mode of transmission: Secondary | ICD-10-CM

## 2017-10-14 DIAGNOSIS — R300 Dysuria: Secondary | ICD-10-CM

## 2017-10-14 DIAGNOSIS — I10 Essential (primary) hypertension: Secondary | ICD-10-CM

## 2017-10-14 LAB — POCT URINALYSIS DIPSTICK
Bilirubin, UA: NEGATIVE
Blood, UA: NEGATIVE
Glucose, UA: NEGATIVE
Ketones, UA: NEGATIVE
Leukocytes, UA: NEGATIVE
Nitrite, UA: NEGATIVE
Protein, UA: NEGATIVE
Spec Grav, UA: 1.02 (ref 1.010–1.025)
Urobilinogen, UA: 0.2 E.U./dL
pH, UA: 7 (ref 5.0–8.0)

## 2017-10-14 MED ORDER — LISINOPRIL-HYDROCHLOROTHIAZIDE 20-25 MG PO TABS: 1.0000 | ORAL_TABLET | Freq: Every day | ORAL | 1 refills | Status: DC

## 2017-10-14 MED ORDER — SULFAMETHOXAZOLE-TRIMETHOPRIM 800-160 MG PO TABS: 1.0000 | ORAL_TABLET | Freq: Two times a day (BID) | ORAL | 0 refills | Status: AC

## 2017-10-14 MED FILL — SULFAMETHOXAZOLE-TMP DS TAB: 800-160 | 7 days supply | Qty: 14 | Fill #0

## 2017-10-14 MED FILL — ?LISINOPRIL-HCTZ 20/25 TAB: 20-25 | 90 days supply | Qty: 90 | Fill #0

## 2017-10-14 NOTE — Patient Instructions (Addendum)
Have a good trip. I will see you in 3 months.   Hypertension Hypertension is another name for high blood pressure. High blood pressure forces your heart to work harder to pump blood. This can cause problems over time. There are two numbers in a blood pressure reading. There is a top number (systolic) over a bottom number (diastolic). It is best to have a blood pressure below 120/80. Healthy choices can help lower your blood pressure. You may need medicine to help lower your blood pressure if:  Your blood pressure cannot be lowered with healthy choices.  Your blood pressure is higher than 130/80.  Follow these instructions at home: Eating and drinking  If directed, follow the DASH eating plan. This diet includes: ? Filling half of your plate at each meal with fruits and vegetables. ? Filling one quarter of your plate at each meal with whole grains. Whole grains include whole wheat pasta, brown rice, and whole grain bread. ? Eating or drinking low-fat dairy products, such as skim milk or low-fat yogurt. ? Filling one quarter of your plate at each meal with low-fat (lean) proteins. Low-fat proteins include fish, skinless chicken, eggs, beans, and tofu. ? Avoiding fatty meat, cured and processed meat, or chicken with skin. ? Avoiding premade or processed food.  Eat less than 1,500 mg of salt (sodium) a day.  Limit alcohol use to no more than 1 drink a day for nonpregnant women and 2 drinks a day for men. One drink equals 12 oz of beer, 5 oz of wine, or 1 oz of hard liquor. Lifestyle  Work with your doctor to stay at a healthy weight or to lose weight. Ask your doctor what the best weight is for you.  Get at least 30 minutes of exercise that causes your heart to beat faster (aerobic exercise) most days of the week. This may include walking, swimming, or biking.  Get at least 30 minutes of exercise that strengthens your muscles (resistance exercise) at least 3 days a week. This may include  lifting weights or pilates.  Do not use any products that contain nicotine or tobacco. This includes cigarettes and e-cigarettes. If you need help quitting, ask your doctor.  Check your blood pressure at home as told by your doctor.  Keep all follow-up visits as told by your doctor. This is important. Medicines  Take over-the-counter and prescription medicines only as told by your doctor. Follow directions carefully.  Do not skip doses of blood pressure medicine. The medicine does not work as well if you skip doses. Skipping doses also puts you at risk for problems.  Ask your doctor about side effects or reactions to medicines that you should watch for. Contact a doctor if:  You think you are having a reaction to the medicine you are taking.  You have headaches that keep coming back (recurring).  You feel dizzy.  You have swelling in your ankles.  You have trouble with your vision. Get help right away if:  You get a very bad headache.  You start to feel confused.  You feel weak or numb.  You feel faint.  You get very bad pain in your: ? Chest. ? Belly (abdomen).  You throw up (vomit) more than once.  You have trouble breathing. Summary  Hypertension is another name for high blood pressure.  Making healthy choices can help lower blood pressure. If your blood pressure cannot be controlled with healthy choices, you may need to take medicine. This information  This information is not intended to replace advice given to you by your health care provider. Make sure you discuss any questions you have with your health care provider. Document Released: 08/22/2007 Document Revised: 02/01/2016 Document Reviewed: 02/01/2016 Elsevier Interactive Patient Education  2018 Elsevier Inc.  

## 2017-10-14 NOTE — Progress Notes (Signed)
Subjective:    Patient here for follow-up of elevated blood pressure. He is going out of the country for 3 months and would like to have a 90 day supply of his medication.  He is exercising and is adherent to a low-salt diet.  Blood pressure is well controlled at home. Cardiac symptoms: none. Patient denies: chest pain, dyspnea, fatigue, irregular heart beat and lower extremity edema. Cardiovascular risk factors: hypertension and male gender. Use of agents associated with hypertension: none. History of target organ damage: none.  The following portions of the patient's history were reviewed and updated as appropriate: allergies, current medications, past family history, past medical history, past social history, past surgical history and problem list. Patient with a hx of prostate enlargement. He states that he is having burning with urination in the mornings. States that his urine is hesitant to come out and when it does, there is discomfort in the urethra/shaft. 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 He is currently sexually active with 1 male partner.   Review of Systems Pertinent items noted in HPI and remainder of comprehensive ROS otherwise negative.     Objective:    BP 134/87 (BP Location: Right Arm, Patient Position: Sitting, Cuff Size: Large)   Pulse 78   Temp 98.6 F (37 C) (Oral)   Resp 16   Ht 5\' 10"  (1.778 m)   Wt 208 lb (94.3 kg)   SpO2 100%   BMI 29.84 kg/m   General Appearance:    Alert, cooperative, no distress, appears stated age  Head:    Normocephalic, without obvious abnormality, atraumatic  Eyes:    PERRL, conjunctiva/corneas clear, EOM's intact, fundi    benign, both eyes                Neck:   Supple, symmetrical, trachea midline, no adenopathy;       thyroid:  No enlargement/tenderness/nodules; no carotid   bruit or JVD  Back:     Symmetric, no curvature, ROM normal, no CVA tenderness  Lungs:     Clear to auscultation bilaterally,  respirations unlabored  Chest wall:    No tenderness or deformity  Heart:    Regular rate and rhythm, S1 and S2 normal, no murmur, rub   or gallop  Abdomen:     Soft, non-tender, bowel sounds active all four quadrants,    no masses, no organomegaly  Genitalia:    Deferred by patient  Rectal:    Deferred by patient  Extremities:   Extremities normal, atraumatic, no cyanosis or edema  Pulses:   2+ and symmetric all extremities  Skin:   Skin color, texture, turgor normal, no rashes or lesions  Lymph nodes:   Cervical, supraclavicular, and axillary nodes normal  Neurologic:   CNII-XII intact. Normal strength, sensation and reflexes      throughout      Assessment:    Hypertension, normal blood pressure continue with current medications Dysuria- patient requesting antibiotics. Chl/gc and HIV ordered.   Evidence of target organ damage: none.    Plan:  1. Essential hypertension Continue with current medication. Continue with heart healthy diet and exercise.  - Urinalysis Dipstick - lisinopril-hydrochlorothiazide (PRINZIDE,ZESTORETIC) 20-25 MG tablet; Take 1 tablet by mouth daily.  Dispense: 90 tablet; Refill: 1 - Basic Metabolic Panel - Hepatic Function Panel - Lipid Panel  2. Dysuria - Chlamydia/GC NAA, Confirmation - sulfamethoxazole-trimethoprim (BACTRIM DS,SEPTRA DS) 800-160 MG tablet; Take 1 tablet by mouth 2 (two) times  daily for 7 days.  Dispense: 14 tablet; Refill: 0  3. Screen for STD (sexually transmitted disease) - HIV antibody (with reflex)   Dietary sodium restriction. Regular aerobic exercise. Follow up: 3 months and as needed. Bactrim DS BID x 7 days for dysuria.

## 2017-10-15 LAB — LIPID PANEL
Chol/HDL Ratio: 3.7 ratio (ref 0.0–5.0)
Cholesterol, Total: 179 mg/dL (ref 100–199)
HDL: 48 mg/dL (ref >39)
LDL Calculated: 109 mg/dL — ABNORMAL HIGH (ref 0–99)
Triglycerides: 109 mg/dL (ref 0–149)
VLDL Cholesterol Cal: 22 mg/dL (ref 5–40)

## 2017-10-15 LAB — HIV ANTIBODY (ROUTINE TESTING W REFLEX): HIV Screen 4th Generation wRfx: NONREACTIVE

## 2017-10-15 LAB — BASIC METABOLIC PANEL
BUN/Creatinine Ratio: 12 (ref 9–20)
BUN: 13 mg/dL (ref 6–24)
CO2: 27 mmol/L (ref 20–29)
Calcium: 9.3 mg/dL (ref 8.7–10.2)
Chloride: 102 mmol/L (ref 96–106)
Creatinine, Ser: 1.1 mg/dL (ref 0.76–1.27)
GFR calc Af Amer: 92 mL/min/{1.73_m2} (ref >59)
GFR calc non Af Amer: 80 mL/min/{1.73_m2} (ref >59)
Glucose: 102 mg/dL — ABNORMAL HIGH (ref 65–99)
Potassium: 3.9 mmol/L (ref 3.5–5.2)
Sodium: 140 mmol/L (ref 134–144)

## 2017-10-15 LAB — HEPATIC FUNCTION PANEL
ALT: 28 IU/L (ref 0–44)
AST: 25 IU/L (ref 0–40)
Albumin: 4.2 g/dL (ref 3.5–5.5)
Alkaline Phosphatase: 67 IU/L (ref 39–117)
Bilirubin Total: 0.5 mg/dL (ref 0.0–1.2)
Bilirubin, Direct: 0.12 mg/dL (ref 0.00–0.40)
Total Protein: 7.2 g/dL (ref 6.0–8.5)

## 2017-10-17 LAB — CHLAMYDIA/GC NAA, CONFIRMATION
Chlamydia trachomatis, NAA: NEGATIVE
Neisseria gonorrhoeae, NAA: NEGATIVE

## 2017-10-21 ENCOUNTER — Telehealth: Payer: Self-pay

## 2017-10-21 NOTE — Telephone Encounter (Signed)
-----   Message from Mike GipAndre Douglas, FNP sent at 10/19/2017  4:34 PM EDT ----- Please let patient know that his chlamydia and gonorrhea were negative

## 2017-10-21 NOTE — Telephone Encounter (Signed)
Called and spoke with patient. Advised that labs were normal. Thanks!

## 2017-10-22 ENCOUNTER — Ambulatory Visit: Payer: Self-pay

## 2018-01-13 ENCOUNTER — Ambulatory Visit: Payer: Self-pay | Admitting: Family Medicine

## 2018-04-18 MED FILL — LISINOPRIL-HCTZ 20-25 MG TA: 20-25 | 30 days supply | Qty: 30 | Fill #1

## 2018-04-28 ENCOUNTER — Ambulatory Visit: Payer: Self-pay | Attending: Family Medicine

## 2018-04-30 ENCOUNTER — Telehealth: Payer: Self-pay | Admitting: Family Medicine

## 2018-04-30 ENCOUNTER — Ambulatory Visit (INDEPENDENT_AMBULATORY_CARE_PROVIDER_SITE_OTHER): Payer: Self-pay | Admitting: Family Medicine

## 2018-04-30 ENCOUNTER — Encounter: Payer: Self-pay | Admitting: Family Medicine

## 2018-04-30 VITALS — BP 131/83 | HR 70 | Temp 98.0°F | Resp 16 | Ht 70.0 in | Wt 194.0 lb

## 2018-04-30 DIAGNOSIS — R7303 Prediabetes: Secondary | ICD-10-CM

## 2018-04-30 DIAGNOSIS — I10 Essential (primary) hypertension: Secondary | ICD-10-CM

## 2018-04-30 DIAGNOSIS — R300 Dysuria: Secondary | ICD-10-CM

## 2018-04-30 LAB — POCT URINALYSIS DIPSTICK
Bilirubin, UA: NEGATIVE
Blood, UA: NEGATIVE
Glucose, UA: NEGATIVE
Ketones, UA: NEGATIVE
Leukocytes, UA: NEGATIVE
Nitrite, UA: NEGATIVE
Protein, UA: POSITIVE — AB
Spec Grav, UA: 1.015 (ref 1.010–1.025)
Urobilinogen, UA: 1 E.U./dL
pH, UA: 8.5 — AB (ref 5.0–8.0)

## 2018-04-30 LAB — POCT GLYCOSYLATED HEMOGLOBIN (HGB A1C): Hemoglobin A1C: 5.5 % (ref 4.0–5.6)

## 2018-04-30 MED ORDER — AZITHROMYCIN 500 MG PO TABS: 1000.0000 mg | ORAL_TABLET | Freq: Once | ORAL | 0 refills | Status: AC

## 2018-04-30 MED ORDER — CEFTRIAXONE SODIUM 250 MG IJ SOLR
250.0000 mg | Freq: Once | INTRAMUSCULAR | Status: AC
  Administered 2018-04-30: 250 mg via INTRAMUSCULAR

## 2018-04-30 MED FILL — AZITHROMYCIN 500 MG TABLET: 500 | 1 days supply | Qty: 2 | Fill #0

## 2018-04-30 NOTE — Patient Instructions (Signed)
Prediabetes Eating Plan Prediabetes is a condition that causes blood sugar (glucose) levels to be higher than normal. This increases the risk for developing diabetes. In order to prevent diabetes from developing, your health care provider may recommend a diet and other lifestyle changes to help you:  Control your blood glucose levels.  Improve your cholesterol levels.  Manage your blood pressure. Your health care provider may recommend working with a diet and nutrition specialist (dietitian) to make a meal plan that is best for you. What are tips for following this plan? Lifestyle  Set weight loss goals with the help of your health care team. It is recommended that most people with prediabetes lose 7% of their current body weight.  Exercise for at least 30 minutes at least 5 days a week.  Attend a support group or seek ongoing support from a mental health counselor.  Take over-the-counter and prescription medicines only as told by your health care provider. Reading food labels  Read food labels to check the amount of fat, salt (sodium), and sugar in prepackaged foods. Avoid foods that have: ? Saturated fats. ? Trans fats. ? Added sugars.  Avoid foods that have more than 300 milligrams (mg) of sodium per serving. Limit your daily sodium intake to less than 2,300 mg each day. Shopping  Avoid buying pre-made and processed foods. Cooking  Cook with olive oil. Do not use butter, lard, or ghee.  Bake, broil, grill, or boil foods. Avoid frying. Meal planning   Work with your dietitian to develop an eating plan that is right for you. This may include: ? Tracking how many calories you take in. Use a food diary, notebook, or mobile application to track what you eat at each meal. ? Using the glycemic index (GI) to plan your meals. The index tells you how quickly a food will raise your blood glucose. Choose low-GI foods. These foods take a longer time to raise blood glucose.  Consider  following a Mediterranean diet. This diet includes: ? Several servings each day of fresh fruits and vegetables. ? Eating fish at least twice a week. ? Several servings each day of whole grains, beans, nuts, and seeds. ? Using olive oil instead of other fats. ? Moderate alcohol consumption. ? Eating small amounts of red meat and whole-fat dairy.  If you have high blood pressure, you may need to limit your sodium intake or follow a diet such as the DASH eating plan. DASH is an eating plan that aims to lower high blood pressure. What foods are recommended? The items listed below may not be a complete list. Talk with your dietitian about what dietary choices are best for you. Grains Whole grains, such as whole-wheat or whole-grain breads, crackers, cereals, and pasta. Unsweetened oatmeal. Bulgur. Barley. Quinoa. Brown rice. Corn or whole-wheat flour tortillas or taco shells. Vegetables Lettuce. Spinach. Peas. Beets. Cauliflower. Cabbage. Broccoli. Carrots. Tomatoes. Squash. Eggplant. Herbs. Peppers. Onions. Cucumbers. Brussels sprouts. Fruits Berries. Bananas. Apples. Oranges. Grapes. Papaya. Mango. Pomegranate. Kiwi. Grapefruit. Cherries. Meats and other protein foods Seafood. Poultry without skin. Lean cuts of pork and beef. Tofu. Eggs. Nuts. Beans. Dairy Low-fat or fat-free dairy products, such as yogurt, cottage cheese, and cheese. Beverages Water. Tea. Coffee. Sugar-free or diet soda. Seltzer water. Lowfat or no-fat milk. Milk alternatives, such as soy or almond milk. Fats and oils Olive oil. Canola oil. Sunflower oil. Grapeseed oil. Avocado. Walnuts. Sweets and desserts Sugar-free or low-fat pudding. Sugar-free or low-fat ice cream and other frozen treats.   Seasoning and other foods Herbs. Sodium-free spices. Mustard. Relish. Low-fat, low-sugar ketchup. Low-fat, low-sugar barbecue sauce. Low-fat or fat-free mayonnaise. What foods are not recommended? The items listed below may not be a  complete list. Talk with your dietitian about what dietary choices are best for you. Grains Refined white flour and flour products, such as bread, pasta, snack foods, and cereals. Vegetables Canned vegetables. Frozen vegetables with butter or cream sauce. Fruits Fruits canned with syrup. Meats and other protein foods Fatty cuts of meat. Poultry with skin. Breaded or fried meat. Processed meats. Dairy Full-fat yogurt, cheese, or milk. Beverages Sweetened drinks, such as sweet iced tea and soda. Fats and oils Butter. Lard. Ghee. Sweets and desserts Baked goods, such as cake, cupcakes, pastries, cookies, and cheesecake. Seasoning and other foods Spice mixes with added salt. Ketchup. Barbecue sauce. Mayonnaise. Summary  To prevent diabetes from developing, you may need to make diet and other lifestyle changes to help control blood sugar, improve cholesterol levels, and manage your blood pressure.  Set weight loss goals with the help of your health care team. It is recommended that most people with prediabetes lose 7 percent of their current body weight.  Consider following a Mediterranean diet that includes plenty of fresh fruits and vegetables, whole grains, beans, nuts, seeds, fish, lean meat, low-fat dairy, and healthy oils. This information is not intended to replace advice given to you by your health care provider. Make sure you discuss any questions you have with your health care provider. Document Released: 07/20/2014 Document Revised: 05/09/2016 Document Reviewed: 05/09/2016 Elsevier Interactive Patient Education  2019 Elsevier Inc. Dysuria Dysuria is pain or discomfort while urinating. The pain or discomfort may be felt in the part of your body that drains urine from the bladder (urethra) or in the surrounding tissue of the genitals. The pain may also be felt in the groin area, lower abdomen, or lower back. You may have to urinate frequently or have the sudden feeling that you  have to urinate (urgency). Dysuria can affect both men and women, but it is more common in women. Dysuria can be caused by many different things, including:  Urinary tract infection.  Kidney stones or bladder stones.  Certain sexually transmitted infections (STIs), such as chlamydia.  Dehydration.  Inflammation of the tissues of the vagina.  Use of certain medicines.  Use of certain soaps or scented products that cause irritation. Follow these instructions at home: General instructions  Watch your condition for any changes.  Urinate often. Avoid holding urine for long periods of time.  After a bowel movement or urination, women should cleanse from front to back, using each tissue only once.  Urinate after sexual intercourse.  Keep all follow-up visits as told by your health care provider. This is important.  If you had any tests done to find the cause of dysuria, it is up to you to get your test results. Ask your health care provider, or the department that is doing the test, when your results will be ready. Eating and drinking   Drink enough fluid to keep your urine pale yellow.  Avoid caffeine, tea, and alcohol. They can irritate the bladder and make dysuria worse. In men, alcohol may irritate the prostate. Medicines  Take over-the-counter and prescription medicines only as told by your health care provider.  If you were prescribed an antibiotic medicine, take it as told by your health care provider. Do not stop taking the antibiotic even if you start to feel better.  Contact a health care provider if:  You have a fever.  You develop pain in your back or sides.  You have nausea or vomiting.  You have blood in your urine.  You are not urinating as often as you usually do. Get help right away if:  Your pain is severe and not relieved with medicines.  You cannot eat or drink without vomiting.  You are confused.  You have a rapid heartbeat while at  rest.  You have shaking or chills.  You feel extremely weak. Summary  Dysuria is pain or discomfort while urinating. Many different conditions can lead to dysuria.  If you have dysuria, you may have to urinate frequently or have the sudden feeling that you have to urinate (urgency).  Watch your condition for any changes. Keep all follow-up visits as told by your health care provider.  Make sure that you urinate often and drink enough fluid to keep your urine pale yellow. This information is not intended to replace advice given to you by your health care provider. Make sure you discuss any questions you have with your health care provider. Document Released: 12/02/2003 Document Revised: 12/20/2016 Document Reviewed: 12/20/2016 Elsevier Interactive Patient Education  2019 [employer]

## 2018-04-30 NOTE — Telephone Encounter (Signed)
Unable to leave VM to Pt, VM is full, I try to called him to informed him that the documents he submitted from the bank are incorrect we need the bank statement not the bank account history, he need to bring the correct documents

## 2018-04-30 NOTE — Progress Notes (Signed)
[facility]   Progress Note: General Provider: Mike GipAndre Delman Goshorn, FNP  SUBJECTIVE:   Kevin Little is a 48 y.o. male who  has a past medical history of Herpes, Herpes simplex, Hypertension, and Sinus problem.. Patient presents today for Hypertension; Follow-up (wants to be screening for dibetes ); and Dysuria   Patient reports compliance with all medications.  Patient denies side effects of medications.  [place] [facility] [place] 6 month stent. Patient states that he is doing well. Had one episode of diarrhea while traveling. He states that it was resolved with Maalox.  Review of Systems  Constitutional: Negative.   HENT: Negative.   Eyes: Negative.   Respiratory: Negative.   Cardiovascular: Negative.   Gastrointestinal: Negative.   Genitourinary: Positive for dysuria.  Musculoskeletal: Negative.   Skin: Negative.   Neurological: Negative.   Psychiatric/Behavioral: Negative.      OBJECTIVE: BP 131/83 (BP Location: Right Arm, Patient Position: Sitting, Cuff Size: Normal)   Pulse 70   Temp 98 F (36.7 C) (Oral)   Resp 16   Ht 5\' 10"  (1.778 m)   Wt 194 lb (88 kg)   SpO2 98%   BMI 27.84 kg/m   Wt Readings from Last 3 Encounters:  04/30/18 194 lb (88 kg)  10/14/17 208 lb (94.3 kg)  05/20/17 208 lb (94.3 kg)     Physical Exam Vitals signs and nursing note reviewed.  Constitutional:      General: He is not in acute distress.    Appearance: He is well-developed.  HENT:     Head: Normocephalic and atraumatic.  Eyes:     Conjunctiva/sclera: Conjunctivae normal.     Pupils: Pupils are equal, round, and reactive to light.  Neck:     Musculoskeletal: Normal range of motion.  Cardiovascular:     Rate and Rhythm: Normal rate and regular rhythm.     Heart sounds: Normal heart sounds.  Pulmonary:     Effort: Pulmonary effort is normal. No respiratory distress.     Breath sounds: Normal breath  sounds.  Abdominal:     General: Bowel sounds are normal. There is no distension.     Palpations: Abdomen is soft.  Musculoskeletal: Normal range of motion.  Skin:    General: Skin is warm and dry.  Neurological:     Mental Status: He is alert and oriented to person, place, and time.  Psychiatric:        Behavior: Behavior normal.        Thought Content: Thought content normal.     ASSESSMENT/PLAN:   1. Essential hypertension No medication changes warranted at the present time.   - Urinalysis Dipstick  2. Prediabetes A1C-5.5. No medications added. Continue with diet and exercise.  - HgB A1c  3. Dysuria Patient would like antibiotics.  - Chlamydia/GC NAA, Confirmation - cefTRIAXone (ROCEPHIN) injection 250 mg - azithromycin (ZITHROMAX) 500 MG tablet; Take 2 tablets (1,000 mg total) by mouth once for 1 dose.  Dispense: 2 tablet; Refill: 0     Return in about 1 week (around 05/07/2018) for Complete phys with fasting labs.    The patient was given clear instructions to go to ER or return to medical center if symptoms do not improve, worsen or new problems develop. The patient verbalized understanding and agreed with plan of care.   Ms. Freda Jacksonndr L. Riley Lamouglas, FNP-BC [facility] [address]  [place], [place] [address] [phone]

## 2018-05-03 LAB — CHLAMYDIA/GC NAA, CONFIRMATION
Chlamydia trachomatis, NAA: NEGATIVE
Neisseria gonorrhoeae, NAA: NEGATIVE

## 2018-05-07 ENCOUNTER — Ambulatory Visit (INDEPENDENT_AMBULATORY_CARE_PROVIDER_SITE_OTHER): Payer: Self-pay | Admitting: Family Medicine

## 2018-05-07 ENCOUNTER — Ambulatory Visit (HOSPITAL_COMMUNITY)
Admission: RE | Admit: 2018-05-07 | Discharge: 2018-05-07 | Disposition: A | Discharge status: Home / Self Care | Payer: Self-pay | Source: Ambulatory Visit | Attending: Family Medicine | Admitting: Family Medicine

## 2018-05-07 ENCOUNTER — Ambulatory Visit: Payer: Self-pay | Admitting: Family Medicine

## 2018-05-07 ENCOUNTER — Encounter: Payer: Self-pay | Admitting: Family Medicine

## 2018-05-07 VITALS — BP 138/88 | Temp 98.4°F | Resp 16 | Ht 70.0 in | Wt 195.0 lb

## 2018-05-07 DIAGNOSIS — Z0000 Encounter for general adult medical examination without abnormal findings: Secondary | ICD-10-CM

## 2018-05-07 DIAGNOSIS — M419 Scoliosis, unspecified: Secondary | ICD-10-CM | POA: Insufficient documentation

## 2018-05-07 LAB — POCT URINALYSIS DIPSTICK
Bilirubin, UA: NEGATIVE
Blood, UA: NEGATIVE
Glucose, UA: NEGATIVE
Ketones, UA: NEGATIVE
Leukocytes, UA: NEGATIVE
Nitrite, UA: NEGATIVE
Protein, UA: NEGATIVE
Spec Grav, UA: 1.02 (ref 1.010–1.025)
Urobilinogen, UA: 0.2 E.U./dL
pH, UA: 7.5 (ref 5.0–8.0)

## 2018-05-07 NOTE — Patient Instructions (Signed)
Scoliosis    Scoliosis is a condition in which the spine curves sideways. Normally, the spine does not curve side-to-side (laterally). With scoliosis, the spine may curve to the left, to the right, or in both directions. The curve of the spine is measured by angles in degrees.  Scoliosis can affect people at any age, but it is more common among children and adolescents.  What are the causes?  The cause of scoliosis is not always known. It may be caused by:   A birth defect.   A disease that can cause problems in the muscles or imbalance of the body, such as cerebral palsy or muscular dystrophy.  What are the signs or symptoms?  This condition may not cause any symptoms. If you do have symptoms, they may include:   Leaning to one side.   Sunken chest and uneven shoulders.   One side of the body being different or larger than the other side (asymmetry).   An abnormal curve in the back.   Pain, which may limit physical activity.   Shortness of breath.   Bowel or bladder control problems, such as not knowing when you have to go. This can be a sign of nerve damage.  How is this diagnosed?  This condition is diagnosed based on:   Your medical history.   Your symptoms.   A physical exam. This may include:  ? Examining your nerves, muscles, and reflexes (neurological exam).  ? Testing the movement of your spine (range of motion study).   Imaging tests, such as:  ? X-rays.  ? MRI.  How is this treated?  Treatment for this condition depends on the severity of the symptoms. Treatment may include:   Observation to make sure that your scoliosis does not get worse (progress). You may need to have regular visits with your health care provider.   A back brace to prevent scoliosis from progressing. This may be needed during times of fast growth (growth spurts), such as during adolescence.   Medicine to help relieve pain.   Physical therapy.   Surgery.  Follow these instructions at home:  If you have a  brace:   Wear the brace as told by your health care provider. Remove it only as told by your health care provider.   Loosen the brace if your fingers or toes tingle, become numb, or turn cold and blue.   Keep the brace clean.   If the brace is not waterproof:  ? Do not let it get wet.  ? Cover it with a watertight covering when you take a bath or a shower.  General instructions   Take over-the-counter and prescription medicines only as told by your health care provider.   Donot drive or use heavy machinery while taking prescription pain medicine.   If physical therapy was prescribed, do exercises as instructed.   Before starting any new sports or physical activities, ask your health care provider whether they are safe for you.   Keep all follow-up visits as told by your health care provider. This is important.  Contact a health care provider if you have:   Problems with your back brace, such as skin irritation or discomfort.   Back pain that does not get better with medicine.  Get help right away if:   Your legs feel weak.   You cannot move your legs.   You cannot control when you urinate or pass stool (loss of bladder or bowel control).  Summary     Scoliosis is a condition of having a spine that curves sideways. The spine may curve to the left, to the right, or in both directions.   This condition may be caused by birth defects or diseases that affect muscles and body balance.   Follow your health care provider's instructions about wearing a brace, doing physical activities, and keeping follow-up visits.  This information is not intended to replace advice given to you by your health care provider. Make sure you discuss any questions you have with your health care provider.  Document Released: 03/02/2000 Document Revised: 08/05/2017 Document Reviewed: 06/20/2017  Elsevier Interactive Patient Education  2019 Elsevier Inc.

## 2018-05-07 NOTE — Progress Notes (Signed)
[facility]   Progress Note: General Provider: Mike Gip, FNP  SUBJECTIVE:   Kevin Little is a 48 y.o. male who  has a past medical history of Herpes, Herpes simplex, Hypertension, and Sinus problem.. Patient presents today for Annual Exam  Patient seen one week ago. A1c 5.5. Ch/gc was negative. Patient states that the dysuria resolved after receiving antibiotic therapy.  Patient states that his father had prostate cancer and passed away at 48 years old. Patinet with a family hx of CVA, HTN HLD. He reports siblings with multiple strokes and cardiovascular events. Patient denies problems or concerns at the present time.   Review of Systems  Constitutional: Negative.   HENT: Negative.   Eyes: Negative.   Respiratory: Negative.   Cardiovascular: Negative.   Gastrointestinal: Negative.   Genitourinary: Negative.   Musculoskeletal: Negative.   Skin: Negative.   Neurological: Negative.   Psychiatric/Behavioral: Negative.     OBJECTIVE: BP 138/88 (BP Location: Right Arm, Patient Position: Sitting, Cuff Size: Large)   Temp 98.4 F (36.9 C) (Oral)   Resp 16   Ht 5\' 10"  (1.778 m)   Wt 195 lb (88.5 kg)   SpO2 100%   BMI 27.98 kg/m   Wt Readings from Last 3 Encounters:  05/07/18 195 lb (88.5 kg)  04/30/18 194 lb (88 kg)  10/14/17 208 lb (94.3 kg)     Physical Exam Vitals signs and nursing note reviewed.  Constitutional:      Appearance: He is well-developed.  HENT:     Head: Normocephalic and atraumatic.     Right Ear: External ear normal.     Left Ear: External ear normal.     Nose: Nose normal.     Mouth/Throat:     Mouth: Mucous membranes are moist.     Pharynx: Oropharynx is clear. No oropharyngeal exudate or posterior oropharyngeal erythema.  Eyes:     Conjunctiva/sclera: Conjunctivae normal.     Pupils: Pupils are equal, round, and reactive to light.  Neck:     Musculoskeletal: Normal range of motion and neck  supple.     Thyroid: No thyromegaly.     Trachea: No tracheal deviation.  Cardiovascular:     Rate and Rhythm: Normal rate and regular rhythm.     Heart sounds: Normal heart sounds. No murmur. No friction rub.  Pulmonary:     Effort: Pulmonary effort is normal. No respiratory distress.     Breath sounds: Normal breath sounds. No stridor. No wheezing.  Abdominal:     General: Bowel sounds are normal. There is no distension.     Palpations: Abdomen is soft. There is no mass.     Tenderness: There is no abdominal tenderness.  Musculoskeletal: Normal range of motion.        General: Deformity (scoliosis of the thoracic spine. left higher than right. ) present.  Lymphadenopathy:     Cervical: No cervical adenopathy.  Skin:    General: Skin is warm and dry.  Neurological:     Mental Status: He is alert and oriented to person, place, and time.  Psychiatric:        Mood and Affect: Mood normal.        Behavior: Behavior normal.        Thought Content: Thought content normal.        Judgment: Judgment normal.     ASSESSMENT/PLAN:   1. Annual physical exam - Urinalysis Dipstick - CBC With Differential -  Comprehensive metabolic panel - PSA - Lipid Panel - TSH  2. Scoliosis of thoracic spine, unspecified scoliosis type Advised patient to take otc ibuprofen for mild back pain. Xrays pending.  - DG SCOLIOSIS EVAL COMPLETE SPINE 2 OR 3 VIEWS; Future     Return in about 6 months (around 11/05/2018) for HTN.    The patient was given clear instructions to go to ER or return to medical center if symptoms do not improve, worsen or new problems develop. The patient verbalized understanding and agreed with plan of care.   Ms. Freda Jackson. Riley Lam, FNP-BC [facility] [address] [place], [place] [address] [phone]

## 2018-05-08 ENCOUNTER — Telehealth: Payer: Self-pay

## 2018-05-08 LAB — PSA: Prostate Specific Ag, Serum: 0.5 ng/mL (ref 0.0–4.0)

## 2018-05-08 LAB — CBC WITH DIFFERENTIAL
Basophils Absolute: 0 10*3/uL (ref 0.0–0.2)
Basos: 1 %
EOS (ABSOLUTE): 0.1 10*3/uL (ref 0.0–0.4)
Eos: 3 %
Hematocrit: 43.3 % (ref 37.5–51.0)
Hemoglobin: 14.3 g/dL (ref 13.0–17.7)
Immature Grans (Abs): 0 10*3/uL (ref 0.0–0.1)
Immature Granulocytes: 0 %
Lymphocytes Absolute: 1.7 10*3/uL (ref 0.7–3.1)
Lymphs: 51 %
MCH: 27.9 pg (ref 26.6–33.0)
MCHC: 33 g/dL (ref 31.5–35.7)
MCV: 85 fL (ref 79–97)
Monocytes Absolute: 0.5 10*3/uL (ref 0.1–0.9)
Monocytes: 14 %
Neutrophils Absolute: 1 10*3/uL — ABNORMAL LOW (ref 1.4–7.0)
Neutrophils: 31 %
RBC: 5.12 x10E6/uL (ref 4.14–5.80)
RDW: 13.2 % (ref 11.6–15.4)
WBC: 3.3 10*3/uL — ABNORMAL LOW (ref 3.4–10.8)

## 2018-05-08 LAB — COMPREHENSIVE METABOLIC PANEL
ALT: 25 IU/L (ref 0–44)
AST: 20 IU/L (ref 0–40)
Albumin/Globulin Ratio: 1.2 (ref 1.2–2.2)
Albumin: 4.2 g/dL (ref 4.0–5.0)
Alkaline Phosphatase: 77 IU/L (ref 39–117)
BUN/Creatinine Ratio: 9 (ref 9–20)
BUN: 9 mg/dL (ref 6–24)
Bilirubin Total: 0.7 mg/dL (ref 0.0–1.2)
CO2: 30 mmol/L — ABNORMAL HIGH (ref 20–29)
Calcium: 9.4 mg/dL (ref 8.7–10.2)
Chloride: 102 mmol/L (ref 96–106)
Creatinine, Ser: 1 mg/dL (ref 0.76–1.27)
GFR calc Af Amer: 102 mL/min/{1.73_m2} (ref >59)
GFR calc non Af Amer: 89 mL/min/{1.73_m2} (ref >59)
Globulin, Total: 3.4 g/dL (ref 1.5–4.5)
Glucose: 88 mg/dL (ref 65–99)
Potassium: 4.1 mmol/L (ref 3.5–5.2)
Sodium: 142 mmol/L (ref 134–144)
Total Protein: 7.6 g/dL (ref 6.0–8.5)

## 2018-05-08 LAB — LIPID PANEL
Chol/HDL Ratio: 4.3 ratio (ref 0.0–5.0)
Cholesterol, Total: 197 mg/dL (ref 100–199)
HDL: 46 mg/dL (ref >39)
LDL Calculated: 125 mg/dL — ABNORMAL HIGH (ref 0–99)
Triglycerides: 131 mg/dL (ref 0–149)
VLDL Cholesterol Cal: 26 mg/dL (ref 5–40)

## 2018-05-08 LAB — TSH: TSH: 1.01 u[IU]/mL (ref 0.450–4.500)

## 2018-05-08 NOTE — Telephone Encounter (Signed)
-----   Message from Mike Gip, FNP sent at 05/08/2018  8:09 AM EST ----- Please let patient know that the xray did not show any abnormalities with his spine. The spine is normal per the xray. His labs are also stable. He can continue to take ibuprofen or tylenol for back pain.

## 2018-05-08 NOTE — Telephone Encounter (Signed)
Called, no answer. Left a message for patient to call back. Thanks!  

## 2018-05-08 NOTE — Telephone Encounter (Signed)
Called and spoke with patient, advised that xray was normal of spine. Advised that labs are stable. Advised he can continue to take ibuprofen or tylenol to help with back pain. Thanks!

## 2018-06-02 MED FILL — LISINOPRIL-HCTZ 20-25 MG TA: 20-25 | 30 days supply | Qty: 30 | Fill #2

## 2018-07-10 MED FILL — LISINOPRIL-HCTZ 20-25 MG TA: 20-25 | 30 days supply | Qty: 30 | Fill #3

## 2018-07-31 ENCOUNTER — Ambulatory Visit (INDEPENDENT_AMBULATORY_CARE_PROVIDER_SITE_OTHER): Payer: Self-pay | Admitting: Family Medicine

## 2018-07-31 ENCOUNTER — Encounter: Payer: Self-pay | Admitting: Family Medicine

## 2018-07-31 ENCOUNTER — Other Ambulatory Visit: Payer: Self-pay

## 2018-07-31 VITALS — BP 142/94 | HR 71 | Temp 98.0°F | Resp 14 | Ht 70.0 in | Wt 188.0 lb

## 2018-07-31 DIAGNOSIS — I10 Essential (primary) hypertension: Secondary | ICD-10-CM

## 2018-07-31 DIAGNOSIS — R339 Retention of urine, unspecified: Secondary | ICD-10-CM

## 2018-07-31 DIAGNOSIS — R300 Dysuria: Secondary | ICD-10-CM

## 2018-07-31 LAB — POCT URINALYSIS DIPSTICK
Bilirubin, UA: NEGATIVE
Glucose, UA: NEGATIVE
Ketones, UA: NEGATIVE
Leukocytes, UA: NEGATIVE
Nitrite, UA: NEGATIVE
Protein, UA: NEGATIVE
Spec Grav, UA: 1.02 (ref 1.010–1.025)
Urobilinogen, UA: 0.2 E.U./dL
pH, UA: 7 (ref 5.0–8.0)

## 2018-07-31 MED ORDER — TAMSULOSIN HCL 0.4 MG PO CAPS: 0.4000 mg | ORAL_CAPSULE | Freq: Every day | ORAL | 3 refills | Status: DC

## 2018-07-31 MED FILL — TAMSULOSIN HCL 0.4 MG CAP: 0.4 | 30 days supply | Qty: 30 | Fill #0

## 2018-07-31 NOTE — Patient Instructions (Signed)
Tamsulosin capsules What is this medicine? TAMSULOSIN (tam SOO loe sin) is an alpha blocker. It is used to treat the signs and symptoms of an enlarged prostate in men. This condition is also called benign prostatic hyperplasia (BPH). This medicine may be used for other purposes; ask your health care provider or pharmacist if you have questions. COMMON BRAND NAME(S): Flomax What should I tell my health care provider before I take this medicine? They need to know if you have any of the following conditions: -advanced kidney disease -advanced liver disease -low blood pressure -prostate cancer -an unusual or allergic reaction to tamsulosin, sulfa drugs, other medicines, foods, dyes, or preservatives -pregnant or trying to get pregnant -breast-feeding How should I use this medicine? Take this medicine by mouth about 30 minutes after the same meal every day. Follow the directions on the prescription label. Swallow the capsules whole with a glass of water. Do not crush, chew, or open capsules. Do not take your medicine more often than directed. Do not stop taking your medicine unless your doctor tells you to. Talk to your pediatrician regarding the use of this medicine in children. Special care may be needed. Overdosage: If you think you have taken too much of this medicine contact a poison control center or emergency room at once. NOTE: This medicine is only for you. Do not share this medicine with others. What if I miss a dose? If you miss a dose, take it as soon as you can. If it is almost time for your next dose, take only that dose. Do not take double or extra doses. If you stop taking your medicine for several days or more, ask your doctor or health care professional what dose you should start back on. What may interact with this medicine? -cimetidine -fluoxetine -ketoconazole -medicines for erectile disfunction like sildenafil, tadalafil, vardenafil -medicines for high blood pressure  -other alpha-blockers like alfuzosin, doxazosin, phentolamine, phenoxybenzamine, prazosin, terazosin -warfarin This list may not describe all possible interactions. Give your health care provider a list of all the medicines, herbs, non-prescription drugs, or dietary supplements you use. Also tell them if you smoke, drink alcohol, or use illegal drugs. Some items may interact with your medicine. What should I watch for while using this medicine? Visit your doctor or health care professional for regular check ups. You will need lab work done before you start this medicine and regularly while you are taking it. Check your blood pressure as directed. Ask your health care professional what your blood pressure should be, and when you should contact him or her. This medicine may make you feel dizzy or lightheaded. This is more likely to happen after the first dose, after an increase in dose, or during hot weather or exercise. Drinking alcohol and taking some medicines can make this worse. Do not drive, use machinery, or do anything that needs mental alertness until you know how this medicine affects you. Do not sit or stand up quickly. If you begin to feel dizzy, sit down until you feel better. These effects can decrease once your body adjusts to the medicine. Contact your doctor or health care professional right away if you have an erection that lasts longer than 4 hours or if it becomes painful. This may be a sign of a serious problem and must be treated right away to prevent permanent damage. If you are thinking of having cataract surgery, tell your eye surgeon that you have taken this medicine. What side effects may I notice  from receiving this medicine? Side effects that you should report to your doctor or health care professional as soon as possible: -allergic reactions like skin rash or itching, hives, swelling of the lips, mouth, tongue, or throat -breathing problems -change in vision -feeling faint  or lightheaded -irregular heartbeat -prolonged or painful erection -weakness Side effects that usually do not require medical attention (report to your doctor or health care professional if they continue or are bothersome): -back pain -change in sex drive or performance -constipation, nausea or vomiting -cough -drowsy -runny or stuffy nose -trouble sleeping This list may not describe all possible side effects. Call your doctor for medical advice about side effects. You may report side effects to FDA at 1-800-FDA-1088. Where should I keep my medicine? Keep out of the reach of children. Store at room temperature between 15 and 30 degrees C (59 and 86 degrees F). Throw away any unused medicine after the expiration date. NOTE: This sheet is a summary. It may not cover all possible information. If you have questions about this medicine, talk to your doctor, pharmacist, or health care provider.  2019 Elsevier/Gold Standard (2017-08-08 12:54:06)   Acute Urinary Retention, Male  Acute urinary retention means that you cannot pee (urinate) at all, or that you pee too little and your bladder is not emptied completely. If it is not treated, it can lead to kidney damage or other serious problems. Follow these instructions at home:  Take over-the-counter and prescription medicines only as told by your doctor. Ask your doctor what medicines you should stay away from. Do not take any medicine unless your doctor says it is okay to do so.  If you were sent home with a tube that drains the bladder (catheter), take care of it as told by your doctor.  Drink enough fluid to keep your pee clear or pale yellow.  If you were given an antibiotic, take it as told by your doctor. Do not stop taking the antibiotic even if you start to feel better.  Do not use any products that contain nicotine or tobacco, such as cigarettes and e-cigarettes. If you need help quitting, ask your doctor.  Watch for changes in  your symptoms. Tell your doctor about them.  If told, track changes in your blood pressure at home. Tell your doctor about them.  Keep all follow-up visits as told by your doctor. This is important. Contact a doctor if:  You have spasms or you leak pee when you have spasms. Get help right away if:  You have chills or a fever.  You have a tube that drains the bladder and: ? The tube stops draining pee. ? The tube falls out.  You have blood in your pee. Summary  Acute urinary retention means that you cannot pee at all, or that you pee too little and your bladder is not emptied completely. If it is not treated, it can result in kidney damage or other serious problems.  If you were sent home with a tube that drains the bladder, take care of it as told by your doctor.  Monitor any changes in your symptoms. Tell your doctor about any changes. This information is not intended to replace advice given to you by your health care provider. Make sure you discuss any questions you have with your health care provider. Document Released: 08/22/2007 Document Revised: 04/06/2016 Document Reviewed: 04/06/2016 Elsevier Interactive Patient Education  2019 [employer].

## 2018-07-31 NOTE — Progress Notes (Signed)
[facility]   Progress Note: General Provider: Mike Gip, FNP  SUBJECTIVE:   Kevin Little is a 48 y.o. male who  has a past medical history of Herpes, Herpes simplex, Hypertension, and Sinus problem.. Patient presents today for Dysuria (burning with urination. Mostly in the morning. ) and Urinary Retention Patient states that he is having urinary retention and dysuria. Dysuria is worse upon awakening. Has had problems in the past with urinary retention and was prescribed flomax. He states that he has not been taking this medication. Denies abdominal pain, fever or chills.  He was referred to urology in January 2019 for this. No referral notes in the system. He states that he was told "everything is fine". Will request records.  Review of Systems  Constitutional: Negative.   HENT: Negative.   Eyes: Negative.   Respiratory: Negative.   Cardiovascular: Negative.   Gastrointestinal: Negative.   Genitourinary: Positive for dysuria.  Musculoskeletal: Negative.   Skin: Negative.   Neurological: Negative.   Psychiatric/Behavioral: Negative.      OBJECTIVE: BP (!) 142/94 (BP Location: Right Arm, Patient Position: Sitting, Cuff Size: Normal)   Pulse 71   Temp 98 F (36.7 C) (Oral)   Resp 14   Ht 5\' 10"  (1.778 m)   Wt 188 lb (85.3 kg)   SpO2 100%   BMI 26.98 kg/m   Wt Readings from Last 3 Encounters:  07/31/18 188 lb (85.3 kg)  05/07/18 195 lb (88.5 kg)  04/30/18 194 lb (88 kg)     Physical Exam Vitals signs and nursing note reviewed.  Constitutional:      General: He is not in acute distress.    Appearance: Normal appearance.  HENT:     Head: Normocephalic and atraumatic.  Eyes:     Extraocular Movements: Extraocular movements intact.     Conjunctiva/sclera: Conjunctivae normal.     Pupils: Pupils are equal, round, and reactive to light.  Cardiovascular:     Rate and Rhythm: Normal rate.     Heart sounds: No  murmur.  Pulmonary:     Effort: Pulmonary effort is normal.  Abdominal:     General: There is no distension.  Musculoskeletal: Normal range of motion.  Skin:    General: Skin is warm and dry.  Neurological:     Mental Status: He is alert and oriented to person, place, and time.  Psychiatric:        Mood and Affect: Mood normal.        Behavior: Behavior normal.        Thought Content: Thought content normal.        Judgment: Judgment normal.     ASSESSMENT/PLAN:  1. Essential hypertension Advised patient to be compliant with medications.   2. Dysuria Will check for mycoplasmas. Past CT/GC- negative.  - Urinalysis Dipstick - Urine Culture - Ct, Ng, Mycoplasmas NAA, Urine  3. Urinary retention Advised patient to take as prescribed.  - tamsulosin (FLOMAX) 0.4 MG CAPS capsule; Take 1 capsule (0.4 mg total) by mouth daily.  Dispense: 90 capsule; Refill: 3    Return if symptoms worsen or fail to improve.    The patient was given clear instructions to go to ER or return to medical center if symptoms do not improve, worsen or new problems develop. The patient verbalized understanding and agreed with plan of care.   Ms. Freda Jackson. Riley Lam, FNP-BC [facility] [address]  [place], [place] [address] [phone]

## 2018-08-02 LAB — URINE CULTURE: Organism ID, Bacteria: NO GROWTH

## 2018-08-15 ENCOUNTER — Telehealth: Payer: Self-pay

## 2018-08-15 LAB — CT, NG, MYCOPLASMAS NAA, URINE
Chlamydia trachomatis, NAA: NEGATIVE
Mycoplasma genitalium NAA: NEGATIVE
Mycoplasma hominis NAA: NEGATIVE
Neisseria gonorrhoeae, NAA: NEGATIVE
Ureaplasma spp NAA: NEGATIVE

## 2018-08-15 NOTE — Telephone Encounter (Signed)
-----   Message from Kevin Gip, FNP sent at 08/15/2018  8:27 AM EDT ----- Please let patient know that his tests came back negative for all STIs.

## 2018-08-15 NOTE — Telephone Encounter (Signed)
Called, no answer. Left a message that all lab work was normal. Thanks!

## 2018-08-28 ENCOUNTER — Other Ambulatory Visit: Payer: Self-pay

## 2018-08-28 DIAGNOSIS — I10 Essential (primary) hypertension: Secondary | ICD-10-CM

## 2018-08-28 MED ORDER — LISINOPRIL-HYDROCHLOROTHIAZIDE 20-25 MG PO TABS: 1.0000 | ORAL_TABLET | Freq: Every day | ORAL | 1 refills | Status: DC

## 2018-08-28 MED FILL — LISINOPRIL-HCTZ 20-25 MG TA: 20-25 | 30 days supply | Qty: 30 | Fill #0

## 2018-09-18 ENCOUNTER — Other Ambulatory Visit: Payer: Self-pay

## 2018-09-18 ENCOUNTER — Encounter: Payer: Self-pay | Admitting: Family Medicine

## 2018-09-18 ENCOUNTER — Ambulatory Visit (INDEPENDENT_AMBULATORY_CARE_PROVIDER_SITE_OTHER): Payer: Self-pay | Admitting: Family Medicine

## 2018-09-18 VITALS — BP 150/96 | HR 76 | Temp 98.9°F | Resp 16 | Ht 70.0 in | Wt 198.0 lb

## 2018-09-18 DIAGNOSIS — R319 Hematuria, unspecified: Secondary | ICD-10-CM

## 2018-09-18 DIAGNOSIS — R0789 Other chest pain: Secondary | ICD-10-CM

## 2018-09-18 DIAGNOSIS — R300 Dysuria: Secondary | ICD-10-CM

## 2018-09-18 LAB — POCT URINALYSIS DIPSTICK
Bilirubin, UA: NEGATIVE
Glucose, UA: NEGATIVE
Ketones, UA: NEGATIVE
Leukocytes, UA: NEGATIVE
Nitrite, UA: NEGATIVE
Protein, UA: NEGATIVE
Spec Grav, UA: 1.02 (ref 1.010–1.025)
Urobilinogen, UA: 0.2 E.U./dL
pH, UA: 7 (ref 5.0–8.0)

## 2018-09-18 MED ORDER — FAMOTIDINE 20 MG PO TABS: 20.0000 mg | ORAL_TABLET | Freq: Every day | ORAL | 0 refills | Status: DC

## 2018-09-18 MED ORDER — TAMSULOSIN HCL 0.4 MG PO CAPS: 0.8000 mg | ORAL_CAPSULE | Freq: Every day | ORAL | 1 refills | Status: DC

## 2018-09-18 MED ORDER — CIPROFLOXACIN HCL 500 MG PO TABS: 500.0000 mg | ORAL_TABLET | Freq: Two times a day (BID) | ORAL | 0 refills | Status: AC

## 2018-09-18 MED FILL — TAMSULOSIN HCL 0.4 MG CAP: 0.4 | 30 days supply | Qty: 60 | Fill #0

## 2018-09-18 MED FILL — CIPROFLOXACIN HCL 500 MG TA: 500 | 28 days supply | Qty: 56 | Fill #0

## 2018-09-18 NOTE — Patient Instructions (Addendum)
Dysuria Dysuria is pain or discomfort while urinating. The pain or discomfort may be felt in the part of your body that drains urine from the bladder (urethra) or in the surrounding tissue of the genitals. The pain may also be felt in the groin area, lower abdomen, or lower back. You may have to urinate frequently or have the sudden feeling that you have to urinate (urgency). Dysuria can affect both men and women, but it is more common in women. Dysuria can be caused by many different things, including:  Urinary tract infection.  Kidney stones or bladder stones.  Certain sexually transmitted infections (STIs), such as chlamydia.  Dehydration.  Inflammation of the tissues of the vagina.  Use of certain medicines.  Use of certain soaps or scented products that cause irritation. Follow these instructions at home: General instructions  Watch your condition for any changes.  Urinate often. Avoid holding urine for long periods of time.  After a bowel movement or urination, women should cleanse from front to back, using each tissue only once.  Urinate after sexual intercourse.  Keep all follow-up visits as told by your health care provider. This is important.  If you had any tests done to find the cause of dysuria, it is up to you to get your test results. Ask your health care provider, or the department that is doing the test, when your results will be ready. Eating and drinking   Drink enough fluid to keep your urine pale yellow.  Avoid caffeine, tea, and alcohol. They can irritate the bladder and make dysuria worse. In men, alcohol may irritate the prostate. Medicines  Take over-the-counter and prescription medicines only as told by your health care provider.  If you were prescribed an antibiotic medicine, take it as told by your health care provider. Do not stop taking the antibiotic even if you start to feel better. Contact a health care provider if:  You have a fever.   You develop pain in your back or sides.  You have nausea or vomiting.  You have blood in your urine.  You are not urinating as often as you usually do. Get help right away if:  Your pain is severe and not relieved with medicines.  You cannot eat or drink without vomiting.  You are confused.  You have a rapid heartbeat while at rest.  You have shaking or chills.  You feel extremely weak. Summary  Dysuria is pain or discomfort while urinating. Many different conditions can lead to dysuria.  If you have dysuria, you may have to urinate frequently or have the sudden feeling that you have to urinate (urgency).  Watch your condition for any changes. Keep all follow-up visits as told by your health care provider.  Make sure that you urinate often and drink enough fluid to keep your urine pale yellow. This information is not intended to replace advice given to you by your health care provider. Make sure you discuss any questions you have with your health care provider. Document Released: 12/02/2003 Document Revised: 02/15/2017 Document Reviewed: 12/20/2016 Elsevier Patient Education  [place]. Benign Prostatic Hyperplasia  Benign prostatic hyperplasia (BPH) is an enlarged prostate gland that is caused by the normal aging process and not by cancer. The prostate is a walnut-sized gland that is involved in the production of semen. It is located in front of the rectum and below the bladder. The bladder stores urine and the urethra is the tube that carries the urine out of  the body. The prostate may get bigger as a man gets older. An enlarged prostate can press on the urethra. This can make it harder to pass urine. The build-up of urine in the bladder can cause infection. Back pressure and infection may progress to bladder damage and kidney (renal) failure. What are the causes? This condition is part of a normal aging process. However, not all men develop problems from this  condition. If the prostate enlarges away from the urethra, urine flow will not be blocked. If it enlarges toward the urethra and compresses it, there will be problems passing urine. What increases the risk? This condition is more likely to develop in men over the age of 50 years. What are the signs or symptoms? Symptoms of this condition include:  Getting up often during the night to urinate.  Needing to urinate frequently during the day.  Difficulty starting urine flow.  Decrease in size and strength of your urine stream.  Leaking (dribbling) after urinating.  Inability to pass urine. This needs immediate treatment.  Inability to completely empty your bladder.  Pain when you pass urine. This is more common if there is also an infection.  Urinary tract infection (UTI). How is this diagnosed? This condition is diagnosed based on your medical history, a physical exam, and your symptoms. Tests will also be done, such as:  A post-void bladder scan. This measures any amount of urine that may remain in your bladder after you finish urinating.  A digital rectal exam. In a rectal exam, your health care provider checks your prostate by putting a lubricated, gloved finger into your rectum to feel the back of your prostate gland. This exam detects the size of your gland and any abnormal lumps or growths.  An exam of your urine (urinalysis).  A prostate specific antigen (PSA) screening. This is a blood test used to screen for prostate cancer.  An ultrasound. This test uses sound waves to electronically produce a picture of your prostate gland. Your health care provider may refer you to a specialist in kidney and prostate diseases (urologist). How is this treated? Once symptoms begin, your health care provider will monitor your condition (active surveillance or watchful waiting). Treatment for this condition will depend on the severity of your condition. Treatment may include:  Observation  and yearly exams. This may be the only treatment needed if your condition and symptoms are mild.  Medicines to relieve your symptoms, including: ? Medicines to shrink the prostate. ? Medicines to relax the muscle of the prostate.  Surgery in severe cases. Surgery may include: ? Prostatectomy. In this procedure, the prostate tissue is removed completely through an open incision or with a laparoscope or robotics. ? Transurethral resection of the prostate (TURP). In this procedure, a tool is inserted through the opening at the tip of the penis (urethra). It is used to cut away tissue of the inner core of the prostate. The pieces are removed through the same opening of the penis. This removes the blockage. ? Transurethral incision (TUIP). In this procedure, small cuts are made in the prostate. This lessens the prostate's pressure on the urethra. ? Transurethral microwave thermotherapy (TUMT). This procedure uses microwaves to create heat. The heat destroys and removes a small amount of prostate tissue. ? Transurethral needle ablation (TUNA). This procedure uses radio frequencies to destroy and remove a small amount of prostate tissue. ? Interstitial laser coagulation (ILC). This procedure uses a laser to destroy and remove a small amount  of prostate tissue. ? Transurethral electrovaporization (TUVP). This procedure uses electrodes to destroy and remove a small amount of prostate tissue. ? Prostatic urethral lift. This procedure inserts an implant to push the lobes of the prostate away from the urethra. Follow these instructions at home:  Take over-the-counter and prescription medicines only as told by your health care provider.  Monitor your symptoms for any changes. Contact your health care provider with any changes.  Avoid drinking large amounts of liquid before going to bed or out in public.  Avoid or reduce how much caffeine or alcohol you drink.  Give yourself time when you urinate.  Keep  all follow-up visits as told by your health care provider. This is important. Contact a health care provider if:  You have unexplained back pain.  Your symptoms do not get better with treatment.  You develop side effects from the medicine you are taking.  Your urine becomes very dark or has a bad smell.  Your lower abdomen becomes distended and you have trouble passing your urine. Get help right away if:  You have a fever or chills.  You suddenly cannot urinate.  You feel lightheaded, or very dizzy, or you faint.  There are large amounts of blood or clots in the urine.  Your urinary problems become hard to manage.  You develop moderate to severe low back or flank pain. The flank is the side of your body between the ribs and the hip. These symptoms may represent a serious problem that is an emergency. Do not wait to see if the symptoms will go away. Get medical help right away. Call your local emergency services (911 in the [place] Do not drive yourself to the hospital. Summary  Benign prostatic hyperplasia (BPH) is an enlarged prostate that is caused by the normal aging process and not by cancer.  An enlarged prostate can press on the urethra. This can make it hard to pass urine.  This condition is part of a normal aging process and is more likely to develop in men over the age of 50 years.  Get help right away if you suddenly cannot urinate. This information is not intended to replace advice given to you by your health care provider. Make sure you discuss any questions you have with your health care provider. Document Released: 03/05/2005 Document Revised: 01/28/2018 Document Reviewed: 04/09/2016 Elsevier Patient Education  2020 Elsevier Inc.  Heartburn Heartburn is a type of pain or discomfort that can happen in the throat or chest. It is often described as a burning pain. It may also cause a bad, acid-like taste in the mouth. Heartburn may feel worse when you lie down or  bend over. It may be worse at night. It may be caused by stomach contents that move back up (reflux) into the tube that connects the mouth with the stomach (esophagus). Follow these instructions at home: Eating and drinking   Avoid certain foods and drinks as told by your doctor. This may include: ? Coffee and tea (with or without caffeine). ? Drinks that have alcohol. ? Energy drinks and sports drinks. ? Carbonated drinks or sodas. ? Chocolate and cocoa. ? Peppermint and mint flavorings. ? Garlic and onions. ? Horseradish. ? Spicy and acidic foods, such as:  Peppers.  Chili powder and curry powder.  Vinegar.  Hot sauces and BBQ sauce. ? Citrus fruit juices and citrus fruits, such as:  Oranges.  Lemons.  Limes. ? Tomato-based foods, such as:  Red sauce and  pizza with red sauce.  Chili.  Salsa. ? Fried and fatty foods, such as:  Donuts.  [place] fries and potato chips.  High-fat dressings. ? High-fat meats, such as:  Hot dogs and sausage.  Rib eye steak.  Ham and bacon. ? High-fat dairy items, such as:  Whole milk.  Butter.  Cream cheese.  Eat small meals often. Avoid eating large meals.  Avoid drinking large amounts of liquid with your meals.  Avoid eating meals during the 2-3 hours before bedtime.  Avoid lying down right after you eat.  Do not exercise right after you eat. Lifestyle      If you are overweight, lose an amount of weight that is healthy for you. Ask your doctor about a safe weight loss goal.  Do not use any products that contain nicotine or tobacco, including cigarettes, e-cigarettes, and chewing tobacco. These can make your symptoms worse. If you need help quitting, ask your doctor.  Wear loose clothes. Do not wear anything tight around your waist.  Raise (elevate) the head of your bed about 6 inches (15 cm) when you sleep.  Try to lower your stress. If you need help doing this, ask your doctor. General instructions   Pay attention to any changes in your symptoms.  Take over-the-counter and prescription medicines only as told by your doctor. ? Do not take aspirin, ibuprofen, or other NSAIDs unless your doctor says it is okay. ? Stop medicines only as told by your doctor.  Keep all follow-up visits as told by your doctor. This is important. Contact a doctor if:  You have new symptoms.  You lose weight and you do not know why it is happening.  You have trouble swallowing, or it hurts to swallow.  You have wheezing or a cough that keeps happening.  Your symptoms do not get better with treatment.  You have heartburn often for more than 2 weeks. Get help right away if:  You have pain in your arms, neck, jaw, teeth, or back.  You feel sweaty, dizzy, or light-headed.  You have chest pain or shortness of breath.  You throw up (vomit) and your throw up looks like blood or coffee grounds.  Your poop (stool) is bloody or black. These symptoms may represent a serious problem that is an emergency. Do not wait to see if the symptoms will go away. Get medical help right away. Call your local emergency services (911 in the [place] Do not drive yourself to the hospital. Summary  Heartburn is a type of pain that can happen in the throat or chest. It can feel like a burning pain. It may also cause a bad, acid-like taste in the mouth.  You may need to avoid certain foods and drinks to help your symptoms. Ask your doctor what foods and drinks you should avoid.  Take over-the-counter and prescription medicines only as told by your doctor. Do not take aspirin, ibuprofen, or other NSAIDs unless your doctor told you to do so.  Contact your doctor if your symptoms do not get better or they get worse. This information is not intended to replace advice given to you by your health care provider. Make sure you discuss any questions you have with your health care provider. Document Released: 11/15/2010 Document Revised:  08/05/2017 Document Reviewed: 08/05/2017 Elsevier Patient Education  2020 [employer]

## 2018-09-18 NOTE — Progress Notes (Signed)
  Patient [facility]   Progress Note: Sick Visit Provider: Lanae Boast, FNP  SUBJECTIVE:   Elvyn Krohn is a 48 y.o. male who  has a past medical history of Herpes, Herpes simplex, Hypertension, and Sinus problem.. Patient presents today for Urine Output (very slow, Feels like flomax is not helping ) and Dysuria  Patient has been seen several times for dysuria and urinary retention. He states that the Flomax 0.4 is no longer working. He was seen by urology in January 2019 and instructed that he could stop the tamulosin periodically and restart as needed. No explanation for the dysuria. He denies abdominal pain, low back pain, urethral discharge.  Review of Systems  Genitourinary: Positive for dysuria, frequency and hematuria.  All other systems reviewed and are negative.    OBJECTIVE: Ht 5\' 10"  (1.778 m)   Wt 198 lb (89.8 kg)   BMI 28.41 kg/m   Wt Readings from Last 3 Encounters:  09/18/18 198 lb (89.8 kg)  07/31/18 188 lb (85.3 kg)  05/07/18 195 lb (88.5 kg)     Physical Exam Vitals signs and nursing note reviewed.  Constitutional:      General: He is not in acute distress.    Appearance: Normal appearance.  HENT:     Head: Normocephalic and atraumatic.  Eyes:     Extraocular Movements: Extraocular movements intact.     Conjunctiva/sclera: Conjunctivae normal.     Pupils: Pupils are equal, round, and reactive to light.  Cardiovascular:     Rate and Rhythm: Normal rate and regular rhythm.     Heart sounds: No murmur.  Pulmonary:     Effort: Pulmonary effort is normal.     Breath sounds: Normal breath sounds.  Musculoskeletal: Normal range of motion.  Skin:    General: Skin is warm and dry.  Neurological:     Mental Status: He is alert and oriented to person, place, and time.  Psychiatric:        Mood and Affect: Mood normal.        Behavior: Behavior normal.        Thought Content: Thought content normal.      Judgment: Judgment normal.     ASSESSMENT/PLAN:   1. Dysuria - Urinalysis Dipstick - Ambulatory referral to Urology - tamsulosin (FLOMAX) 0.4 MG CAPS capsule; Take 2 capsules (0.8 mg total) by mouth daily.  Dispense: 60 capsule; Refill: 1 - ciprofloxacin (CIPRO) 500 MG tablet; Take 1 tablet (500 mg total) by mouth 2 (two) times daily for 28 days.  Dispense: 56 tablet; Refill: 0 - Urine Culture  2. Hematuria, unspecified type - Urine Culture        The patient was given clear instructions to go to ER or return to medical center if symptoms do not improve, worsen or new problems develop. The patient verbalized understanding and agreed with plan of care.   Ms. Doug Sou. Nathaneil Canary, FNP-BC Patient [facility] [address] [place], [place] [address] [phone]     This note has been created with Dragon speech recognition software and smart phrase technology. Any transcriptional errors are unintentional.

## 2018-09-20 LAB — URINE CULTURE: Organism ID, Bacteria: NO GROWTH

## 2018-09-29 ENCOUNTER — Telehealth: Payer: Self-pay

## 2018-09-29 ENCOUNTER — Other Ambulatory Visit: Payer: Self-pay | Admitting: Family Medicine

## 2018-09-29 NOTE — Telephone Encounter (Signed)
Called, no answer. Left a message. Thanks!  

## 2018-09-29 NOTE — Telephone Encounter (Signed)
Called patient and he was saying that he did not receive his famotidine. I advised this was sent in on 09/18/2018 and asked that he call the pharmacy to see if they still have it. Thanks!

## 2018-09-29 NOTE — Telephone Encounter (Signed)
He can try pepcid over the counter if the pharmacy is on back order. It is listed that he has an allergy to PPIs (omeprazole, protonix).

## 2018-09-29 NOTE — Telephone Encounter (Signed)
Patient states that famotidine is on back order. Can this be changed to something different? Please advise.

## 2018-09-30 NOTE — Telephone Encounter (Signed)
Patient returned call and was advised that he can take otc pepcid since the famotidine is on back order. Patient verbalized understanding. Thanks !

## 2018-10-13 MED FILL — LISINOPRIL-HCTZ 20-25 MG TA: 20-25 | 30 days supply | Qty: 30 | Fill #1

## 2018-10-16 ENCOUNTER — Encounter: Payer: Self-pay | Admitting: Family Medicine

## 2018-10-16 ENCOUNTER — Other Ambulatory Visit: Payer: Self-pay

## 2018-10-16 ENCOUNTER — Ambulatory Visit (INDEPENDENT_AMBULATORY_CARE_PROVIDER_SITE_OTHER): Payer: Self-pay | Admitting: Family Medicine

## 2018-10-16 VITALS — BP 120/69 | HR 71 | Temp 98.2°F | Resp 16 | Ht 70.0 in | Wt 187.0 lb

## 2018-10-16 DIAGNOSIS — K219 Gastro-esophageal reflux disease without esophagitis: Secondary | ICD-10-CM

## 2018-10-16 DIAGNOSIS — R339 Retention of urine, unspecified: Secondary | ICD-10-CM

## 2018-10-16 DIAGNOSIS — I10 Essential (primary) hypertension: Secondary | ICD-10-CM

## 2018-10-16 NOTE — Patient Instructions (Signed)

## 2018-10-16 NOTE — Progress Notes (Signed)
  Patient [facility]   Progress Note: General Provider: Lanae Boast, FNP  SUBJECTIVE:   Kevin Little is a 48 y.o. male who  has a past medical history of Herpes, Herpes simplex, Hypertension, and Sinus problem.. Patient presents today for Follow-up (pepcid is helping ) Patient states that he is doing well on pepcid for GERD. He also is following up for urinary frequency and dysuria that he states has improved since increasing tamsulosin to 0.8mg  daily. His blood pressure has also improved. He states that he is exercising and following a lower sodium diet. He has lost 11 pounds since his last visit. Patient reports compliance with all medications.  Patient denies side effects of medications.    Review of Systems  Constitutional: Negative.   HENT: Negative.   Eyes: Negative.   Respiratory: Negative.   Cardiovascular: Negative.   Gastrointestinal: Negative.   Genitourinary: Positive for dysuria (improving) and urgency (improving).  Musculoskeletal: Negative.   Skin: Negative.   Neurological: Negative.   Psychiatric/Behavioral: Negative.      OBJECTIVE: BP 120/69 (BP Location: Left Arm, Patient Position: Sitting, Cuff Size: Large)   Pulse 71   Temp 98.2 F (36.8 C) (Oral)   Resp 16   Ht 5\' 10"  (1.778 m)   Wt 187 lb (84.8 kg)   SpO2 100%   BMI 26.83 kg/m   Wt Readings from Last 3 Encounters:  10/16/18 187 lb (84.8 kg)  09/18/18 198 lb (89.8 kg)  07/31/18 188 lb (85.3 kg)     Physical Exam Vitals signs and nursing note reviewed.  Constitutional:      General: He is not in acute distress.    Appearance: Normal appearance.  HENT:     Head: Normocephalic and atraumatic.  Eyes:     Extraocular Movements: Extraocular movements intact.     Conjunctiva/sclera: Conjunctivae normal.     Pupils: Pupils are equal, round, and reactive to light.  Cardiovascular:     Rate and Rhythm: Normal rate and regular rhythm.     Heart sounds: No  murmur.  Pulmonary:     Effort: Pulmonary effort is normal.     Breath sounds: Normal breath sounds.  Musculoskeletal: Normal range of motion.  Skin:    General: Skin is warm and dry.  Neurological:     Mental Status: He is alert and oriented to person, place, and time.  Psychiatric:        Mood and Affect: Mood normal.        Behavior: Behavior normal.        Thought Content: Thought content normal.        Judgment: Judgment normal.     ASSESSMENT/PLAN:   1. Essential hypertension 2. Urinary retention 3. Gastroesophageal reflux disease, esophagitis presence not specified  HTN well controlled today. Referral to urology pending. Urinary symptoms have improved. Patient is exercising and working on diet along with pepcid to control GERD.  No medication changes warranted at the present time.    Return in about 3 months (around 01/16/2019), or if symptoms worsen or fail to improve, for HTN.    The patient was given clear instructions to go to ER or return to medical center if symptoms do not improve, worsen or new problems develop. The patient verbalized understanding and agreed with plan of care.   Ms. Doug Sou. Nathaneil Canary, FNP-BC Patient [facility] [address] [place], [place] [address] [phone]

## 2018-10-31 ENCOUNTER — Ambulatory Visit: Payer: Self-pay | Admitting: Family Medicine

## 2018-11-03 MED FILL — TAMSULOSIN HCL 0.4 MG CAP: 0.4 | 30 days supply | Qty: 60 | Fill #1

## 2018-11-05 ENCOUNTER — Encounter: Payer: Self-pay | Admitting: Family Medicine

## 2018-11-05 ENCOUNTER — Other Ambulatory Visit: Payer: Self-pay

## 2018-11-05 ENCOUNTER — Ambulatory Visit (INDEPENDENT_AMBULATORY_CARE_PROVIDER_SITE_OTHER): Payer: Self-pay | Admitting: Family Medicine

## 2018-11-05 ENCOUNTER — Ambulatory Visit: Payer: Self-pay | Admitting: Family Medicine

## 2018-11-05 VITALS — BP 128/85 | HR 84 | Temp 98.3°F | Resp 16 | Ht 70.0 in | Wt 185.0 lb

## 2018-11-05 DIAGNOSIS — R9431 Abnormal electrocardiogram [ECG] [EKG]: Secondary | ICD-10-CM

## 2018-11-05 DIAGNOSIS — R109 Unspecified abdominal pain: Secondary | ICD-10-CM

## 2018-11-05 DIAGNOSIS — K2900 Acute gastritis without bleeding: Secondary | ICD-10-CM

## 2018-11-05 MED ORDER — CLARITHROMYCIN 500 MG PO TABS: 500.0000 mg | ORAL_TABLET | Freq: Two times a day (BID) | ORAL | 0 refills | Status: AC

## 2018-11-05 MED ORDER — AMOXICILLIN 500 MG PO CAPS: 1000.0000 mg | ORAL_CAPSULE | Freq: Two times a day (BID) | ORAL | 0 refills | Status: AC

## 2018-11-05 MED ORDER — METRONIDAZOLE 500 MG PO TABS: 500.0000 mg | ORAL_TABLET | Freq: Two times a day (BID) | ORAL | 0 refills | Status: AC

## 2018-11-05 MED ORDER — BISMUTH SUBSALICYLATE 262 MG PO CHEW: 524.0000 mg | CHEWABLE_TABLET | Freq: Two times a day (BID) | ORAL | 0 refills | Status: AC

## 2018-11-05 MED FILL — metroNIDAZOLE 500 MG TABS: 500 | 14 days supply | Qty: 28 | Fill #0

## 2018-11-05 MED FILL — AMOXICILLIN 500 MG CAPSULE: 500 | 14 days supply | Qty: 56 | Fill #0

## 2018-11-05 MED FILL — CLARITHROMYCIN 500 MG TAB: 500 | 14 days supply | Qty: 28 | Fill #0

## 2018-11-05 NOTE — Patient Instructions (Signed)
Helicobacter Pylori Infection Helicobacter pylori infection is a bacterial infection in the stomach. Long-term (chronic) infection can cause stomach irritation (gastritis), ulcers in the stomach (gastric ulcers), and ulcers in the upper part of the intestine (duodenal ulcers). Having this infection may also increase your risk of stomach cancer and a type of white blood cell cancer (lymphoma) that affects the stomach. What are the causes? This infection is caused by the Helicobacter pylori (H. pylori) bacteria. Many healthy people have this bacteria in their stomach lining. The bacteria may also spread from person to person through contact with stool (feces) or saliva. It is not known why some people develop ulcers, gastritis, or cancer from the bacteria. What increases the risk? You are more likely to develop this condition if you:  Have family members with the infection.  Live with many other people, such as in a dormitory.  Are of African, Hispanic, or Asian descent. What are the signs or symptoms? Most people with this infection do not have any symptoms. If you do have symptoms, they may include:  Heartburn.  Stomach pain.  Nausea.  Vomiting. The vomit may be bloody because of ulcers.  Loss of appetite.  Bad breath. How is this diagnosed? This condition may be diagnosed based on:  Your symptoms and medical history.  A physical exam.  Blood tests.  Stool tests.  A breath test.  A procedure that involves placing a tube with a camera on the end of it down your throat to examine your stomach and upper intestine (upper endoscopy).  Removing and testing a tissue sample from the stomach lining (biopsy). A biopsy may be taken during an upper endoscopy. How is this treated?  This condition is treated by taking a combination of medicines (triple therapy) for several weeks. Triple therapy includes one medicine to reduce the amount of acid in your stomach and two types of  antibiotic medicines. This treatment may reduce your risk of cancer. You may need to be tested for H. pylori again after treatment. In some cases, the treatment may need to be repeated if your treatment did not get rid of all the bacteria. Follow these instructions at home:   Take over-the-counter and prescription medicines only as told by your health care provider.  Take your antibiotics as told by your health care provider. Do not stop taking the antibiotics even if you start to feel better.  Return to your normal activities as told by your health care provider. Ask your health care provider what activities are safe for you.  Take steps to prevent future infections: ? Wash your hands often with soap and water. If soap and water are not available, use hand sanitizer. ? Do not eat food or drink water that may have had contact with stool or saliva.  Keep all follow-up visits as told by your health care provider. This is important. You may need tests to make sure your treatment worked. Contact a health care provider if your symptoms:  Do not get better with treatment.  Return after treatment. Summary  Helicobacter pylori infection is a stomach infection caused by the Helicobacter pylori (H. pylori) bacteria.  This infection can cause stomach irritation (gastritis), ulcers in the stomach (gastric ulcers), and ulcers in the upper part of the intestine (duodenal ulcers).  This condition is treated by taking a combination of medicines (triple therapy) for several weeks.  Take your antibiotics as told by your health care provider. Do not stop taking the antibiotics even if   you start to feel better. This information is not intended to replace advice given to you by your health care provider. Make sure you discuss any questions you have with your health care provider. Document Released: 06/27/2015 Document Revised: 06/26/2018 Document Reviewed: 02/26/2017 Elsevier Patient Education  2020  Elsevier Inc.  

## 2018-11-05 NOTE — Progress Notes (Signed)
[facility]   Progress Note: Sick Visit Provider: Mike GipAndre Glennice Marcos, FNP  SUBJECTIVE:   Imagene GurneyFrancois Klapper is a 48 y.o. male who  has a past medical history of Herpes, Herpes simplex, Hypertension, and Sinus problem.. Patient presents today for Back Pain (mid upper back ) and Abdominal Pain (mid abdominal pain ) Patient presents with heartburn and epigastric abdominal pain that extends to the chest x 1 week. He states that the pain is exacerbated by eating tomato-based spicy foods. He also reports a history of a previous ulcer. Denies hemoptysis, melena or blood in stools.  Review of Systems  Constitutional: Negative.   Gastrointestinal: Positive for abdominal pain and heartburn. Negative for blood in stool, melena, nausea and vomiting.  All other systems reviewed and are negative.    OBJECTIVE: BP 128/85 (BP Location: Left Arm, Patient Position: Sitting, Cuff Size: Large)   Pulse 84   Temp 98.3 F (36.8 C) (Oral)   Resp 16   Ht 5\' 10"  (1.778 m)   Wt 185 lb (83.9 kg)   SpO2 100%   BMI 26.54 kg/m   Wt Readings from Last 3 Encounters:  11/05/18 185 lb (83.9 kg)  10/16/18 187 lb (84.8 kg)  09/18/18 198 lb (89.8 kg)     Physical Exam Vitals signs and nursing note reviewed.  Constitutional:      General: He is not in acute distress.    Appearance: Normal appearance.  HENT:     Head: Normocephalic and atraumatic.  Eyes:     Extraocular Movements: Extraocular movements intact.     Conjunctiva/sclera: Conjunctivae normal.     Pupils: Pupils are equal, round, and reactive to light.  Cardiovascular:     Rate and Rhythm: Normal rate and regular rhythm.     Heart sounds: No murmur.  Pulmonary:     Effort: Pulmonary effort is normal.     Breath sounds: Normal breath sounds.  Abdominal:     General: Bowel sounds are normal.     Tenderness: There is abdominal tenderness in the epigastric area.  Musculoskeletal: Normal range of motion.   Skin:    General: Skin is warm and dry.  Neurological:     Mental Status: He is alert and oriented to person, place, and time.  Psychiatric:        Mood and Affect: Mood normal.        Behavior: Behavior normal.        Thought Content: Thought content normal.        Judgment: Judgment normal.     ASSESSMENT/PLAN:  1. Abdominal pain, unspecified abdominal location Patient would like to begin treatment for H Pylori. He has an allergy to PPIs.  - H. pylori breath test  2. Acute gastritis without hemorrhage, unspecified gastritis type - clarithromycin (BIAXIN) 500 MG tablet; Take 1 tablet (500 mg total) by mouth 2 (two) times daily for 14 days.  Dispense: 28 tablet; Refill: 0 - amoxicillin (AMOXIL) 500 MG capsule; Take 2 capsules (1,000 mg total) by mouth 2 (two) times daily for 14 days.  Dispense: 56 capsule; Refill: 0 - metroNIDAZOLE (FLAGYL) 500 MG tablet; Take 1 tablet (500 mg total) by mouth 2 (two) times daily for 14 days.  Dispense: 28 tablet; Refill: 0 - bismuth subsalicylate (PEPTO-BISMOL) 262 MG chewable tablet; Chew 2 tablets (524 mg total) by mouth 2 (two) times daily for 14 days.  Dispense: 56 tablet; Refill: 0  3. Abnormal EKG Possible T-wave abnormality.  Will refer to cardiology for further evaluation.  - Ambulatory referral to Cardiology        The patient was given clear instructions to go to ER or return to medical center if symptoms do not improve, worsen or new problems develop. The patient verbalized understanding and agreed with plan of care.   Ms. Doug Sou. Nathaneil Canary, FNP-BC [facility] [address] [place], [place] [address] [phone]     This note has been created with Dragon speech recognition software and smart phrase technology. Any transcriptional errors are unintentional.

## 2018-11-07 LAB — H. PYLORI BREATH TEST: H pylori Breath Test: NEGATIVE

## 2018-11-10 ENCOUNTER — Telehealth: Payer: Self-pay

## 2018-11-10 NOTE — Telephone Encounter (Signed)
-----   Message from Lanae Boast, [place] sent at 11/10/2018  1:33 PM EDT ----- Please let patient know that the H Pylori test came back negative for a stomach ulcer. This could be a false negative due to him having had famotidine. I would like for him to continue with all the medications as prescribed to see if this helps with his stomach pain.

## 2018-11-10 NOTE — Telephone Encounter (Signed)
Called and spoke with patient, advised that H pylori test came back negative for stomach ulcer. Advised that this could have have been false due to the famotidine. Asked that he continue medications and keep next scheduled appointment. Thanks !

## 2018-11-19 MED FILL — LISINOPRIL-HCTZ 20-25 MG TA: 20-25 | 30 days supply | Qty: 30 | Fill #2

## 2018-11-26 ENCOUNTER — Encounter (HOSPITAL_COMMUNITY): Payer: Self-pay

## 2018-11-26 ENCOUNTER — Encounter (HOSPITAL_COMMUNITY): Payer: Self-pay | Admitting: *Deleted

## 2018-12-17 ENCOUNTER — Other Ambulatory Visit: Payer: Self-pay | Admitting: Family Medicine

## 2018-12-17 DIAGNOSIS — R300 Dysuria: Secondary | ICD-10-CM

## 2018-12-17 MED FILL — TAMSULOSIN HCL 0.4 MG CAP: 0.4 | 30 days supply | Qty: 60 | Fill #0

## 2018-12-19 ENCOUNTER — Ambulatory Visit: Payer: Self-pay | Attending: Family Medicine

## 2018-12-19 ENCOUNTER — Other Ambulatory Visit: Payer: Self-pay

## 2018-12-25 NOTE — Progress Notes (Signed)
Cardiology Office Note:    Date:  12/29/2018   ID:  Kevin Little, DOB [birth year], MRN [id number]  PCP:  Lanae Boast, [place]  Cardiologist:  No primary care provider on file.  Electrophysiologist:  None   Referring MD: Lanae Boast, FNP   Chief Complaint  Patient presents with  . Chest Pain    History of Present Illness:    Kevin Little is a 48 y.o. male with a hx of hypertension who is referred by Lanae Boast, FNP for evaluation of chest pain and EKG abnormalities.  He was recently seen for chest pain and an EKG was checked which showed T wave abnormalities.  He reports that chest pain started in June.  Describes as a burning across his whole chest.  Reports pain has been constant but is gradually improved.  No clear relationship with exertion, though he reports that he has not been very active does not exercise.  No relationship with eating.  No smoking history.  No family history of heart disease. Reports BP has been under good control with lisinopril-HCTZ.  Past Medical History:  Diagnosis Date  . Herpes   . Herpes simplex   . Hypertension   . Sinus problem     No past surgical history on file.  Current Medications: Current Meds  Medication Sig  . famotidine (PEPCID) 10 MG tablet Take 10 mg by mouth 2 (two) times daily.  Marland Kitchen lisinopril-hydrochlorothiazide (ZESTORETIC) 20-25 MG tablet Take 1 tablet by mouth daily.  . tamsulosin (FLOMAX) 0.4 MG CAPS capsule TAKE 2 CAPSULES (0.8 MG TOTAL) BY MOUTH DAILY.     Allergies:   Omeprazole and Rabeprazole sodium   Social History   Socioeconomic History  . Marital status: Married    Spouse name: Not on file  . Number of children: Not on file  . Years of education: Not on file  . Highest education level: Not on file  Occupational History  . Not on file  Social Needs  . Financial resource strain: Not on file  . Food insecurity    Worry: Not on file    Inability: Not on file  . Transportation needs    Medical: Not  on file    Non-medical: Not on file  Tobacco Use  . Smoking status: Never Smoker  . Smokeless tobacco: Never Used  Substance and Sexual Activity  . Alcohol use: No  . Drug use: No  . Sexual activity: Not on file  Lifestyle  . Physical activity    Days per week: Not on file    Minutes per session: Not on file  . Stress: Not on file  Relationships  . Social Herbalist on phone: Not on file    Gets together: Not on file    Attends religious service: Not on file    Active member of club or organization: Not on file    Attends meetings of clubs or organizations: Not on file    Relationship status: Not on file  Other Topics Concern  . Not on file  Social History Narrative   ** Merged History Encounter **         Family History: The patient's family history includes Cancer in his father; Hypertension in his mother.  ROS:   Please see the history of present illness.     All other systems reviewed and are negative.  EKGs/Labs/Other Studies Reviewed:    The following studies were reviewed today:   EKG:  EKG is  ordered today.  The ekg ordered today demonstrates normal sinus rhythm, rate 67, nonspecific T wave flattening  Recent Labs: 05/07/2018: ALT 25; BUN 9; Creatinine, Ser 1.00; Hemoglobin 14.3; Potassium 4.1; Sodium 142; TSH 1.010  Recent Lipid Panel    Component Value Date/Time   CHOL 197 05/07/2018 1106   TRIG 131 05/07/2018 1106   HDL 46 05/07/2018 1106   CHOLHDL 4.3 05/07/2018 1106   CHOLHDL 3.9 10/30/2016 1134   VLDL 22 10/30/2016 1134   LDLCALC 125 (H) 05/07/2018 1106    Physical Exam:    VS:  BP 137/89   Pulse 67   Ht 5\' 11"  (1.803 m)   Wt 193 lb (87.5 kg)   SpO2 99%   BMI 26.92 kg/m     Wt Readings from Last 3 Encounters:  12/29/18 193 lb (87.5 kg)  11/05/18 185 lb (83.9 kg)  10/16/18 187 lb (84.8 kg)     GEN:  Well nourished, well developed in no acute distress HEENT: Normal NECK: No JVD; No carotid bruits LYMPHATICS: No  lymphadenopathy CARDIAC: RRR, no murmurs, rubs, gallops RESPIRATORY:  Clear to auscultation without rales, wheezing or rhonchi  ABDOMEN: Soft, non-tender, non-distended MUSCULOSKELETAL:  No edema; No deformity  SKIN: Warm and dry NEUROLOGIC:  Alert and oriented x 3 PSYCHIATRIC:  Normal affect   ASSESSMENT:    1. Precordial pain   2. Pre-procedure lab exam   3. Essential hypertension    PLAN:    In order of problems listed above:  Chest pain: atypical in description, but given risk factors and abnormal EKG, would classify as intermediate risk of obstructive coronary disease, and warrants further evaluation -Coronary CTA  Hypertension: On lisinopril-hydrochlorothiazide 20-25 mg.  Appears controlled    Medication Adjustments/Labs and Tests Ordered: Current medicines are reviewed at length with the patient today.  Concerns regarding medicines are outlined above.  Orders Placed This Encounter  Procedures  . CT CORONARY MORPH W/CTA COR W/SCORE W/CA W/CM &/OR WO/CM  . CT CORONARY FRACTIONAL FLOW RESERVE DATA PREP  . CT CORONARY FRACTIONAL FLOW RESERVE FLUID ANALYSIS  . Basic metabolic panel  . EKG 12-Lead   Meds ordered this encounter  Medications  . metoprolol tartrate (LOPRESSOR) 100 MG tablet    Sig: TAKE 1 TABLET 2 HR PRIOR TO CARDIAC PROCEDURE    Dispense:  1 tablet    Refill:  0    Patient Instructions  Medication Instructions:  Your Physician recommend you continue on your current medication as directed.    If you need a refill on your cardiac medications before your next appointment, please call your pharmacy.   Lab work: Your physician recommends that you return for lab work 1 week prior to procedure (BMP).  If you have labs (blood work) drawn today and your tests are completely normal, you will receive your results only by: 10/18/18 MyChart Message (if you have MyChart) OR . A paper copy in the mail If you have any lab test that is abnormal or we need to change  your treatment, we will call you to review the results.  Testing/Procedures: Cardiac CTA  Follow-Up: Your physician recommends that you schedule a follow-up appointment as needed with Dr. Marland Kitchen.  Your cardiac CT will be scheduled at one of the below locations:   [facility] [address] [place], [place] [phone]  OR  [facility] [address] [place], [place] [phone]  If scheduled at [facility], please [facility] main entrance of [facility] 30-45 minutes prior to test start time. Proceed to the [facility] Radiology Department (first floor) to check-in and test prep.  If scheduled at [facility], please arrive 15 mins early for check-in and test prep.  Please follow these instructions carefully (unless otherwise directed):  Hold all erectile dysfunction medications at least 3 days (72 hrs) prior to test.  On the Night Before the Test: . Be sure to Drink plenty of water. . Do not consume any caffeinated/decaffeinated beverages or chocolate 12 hours prior to your test. . Do not take any antihistamines 12 hours prior to your test. .  On the Day of the Test: . Drink plenty of water. Do not drink any water within one hour of the test. . Do not eat any food 4 hours prior to the test. . You may take your regular medications prior to the test.  . Take metoprolol (Lopressor) two hours prior to test. . HOLD Furosemide/Hydrochlorothiazide morning of the test        After the Test: . Drink plenty of water. . After receiving IV contrast, you may experience a mild flushed feeling. This is normal. . On occasion, you may experience a mild rash up to 24 hours after the test. This is not dangerous. If this occurs, you can take Benadryl 25 mg and increase your fluid intake. . If you experience trouble breathing, this can be serious. If  it is severe call 911 IMMEDIATELY. If it is mild, please call our office. . If you take any of these medications: Glipizide/Metformin, Avandament, Glucavance, please do not take 48 hours after completing test unless otherwise instructed.    Please contact the cardiac imaging nurse navigator should you have any questions/concerns Rockwell AlexandriaSara Wallace, RN Navigator Cardiac Imaging [facility] [phone] Office  [phone] Cell       Signed, Little Ishikawahristopher L Ashten Prats, MD  12/29/2018 3:24 PM    [facility]

## 2018-12-29 ENCOUNTER — Ambulatory Visit (INDEPENDENT_AMBULATORY_CARE_PROVIDER_SITE_OTHER): Payer: Self-pay | Admitting: Cardiology

## 2018-12-29 ENCOUNTER — Encounter: Payer: Self-pay | Admitting: Cardiology

## 2018-12-29 ENCOUNTER — Other Ambulatory Visit: Payer: Self-pay

## 2018-12-29 VITALS — BP 137/89 | HR 67 | Ht 71.0 in | Wt 193.0 lb

## 2018-12-29 DIAGNOSIS — Z01812 Encounter for preprocedural laboratory examination: Secondary | ICD-10-CM

## 2018-12-29 DIAGNOSIS — I10 Essential (primary) hypertension: Secondary | ICD-10-CM

## 2018-12-29 DIAGNOSIS — R072 Precordial pain: Secondary | ICD-10-CM

## 2018-12-29 MED ORDER — METOPROLOL TARTRATE 100 MG PO TABS: ORAL_TABLET | ORAL | 0 refills | Status: DC

## 2018-12-29 MED FILL — METOPROLOL TARTRATE 100 MG: 100 | 1 days supply | Qty: 1 | Fill #0

## 2018-12-29 NOTE — Patient Instructions (Addendum)
Medication Instructions:  Your Physician recommend you continue on your current medication as directed.    If you need a refill on your cardiac medications before your next appointment, please call your pharmacy.   Lab work: Your physician recommends that you return for lab work 1 week prior to procedure (BMP).  If you have labs (blood work) drawn today and your tests are completely normal, you will receive your results only by: Marland Kitchen MyChart Message (if you have MyChart) OR . A paper copy in the mail If you have any lab test that is abnormal or we need to change your treatment, we will call you to review the results.  Testing/Procedures: Cardiac CTA  Follow-Up: Your physician recommends that you schedule a follow-up appointment as needed with Dr. Gardiner Rhyme.  Your cardiac CT will be scheduled at one of the below locations:   [facility] [address] [place], [place] [address] (336) [place] [address] [place], [place] [address] [phone]  If scheduled at [facility], please arrive at the [facility] main entrance of [facility] 30-45 minutes prior to test start time. Proceed to the [facility] Radiology Department (first floor) to check-in and test prep.  If scheduled at [facility], please arrive 15 mins early for check-in and test prep.  Please follow these instructions carefully (unless otherwise directed):  Hold all erectile dysfunction medications at least 3 days (72 hrs) prior to test.  On the Night Before the Test: . Be sure to Drink plenty of water. . Do not consume any caffeinated/decaffeinated beverages or chocolate 12 hours prior to your test. . Do not take any antihistamines 12 hours prior to your test. .  On the Day of the Test: . Drink plenty of water. Do not drink any water within one hour of the test. . Do not eat any food 4 hours  prior to the test. . You may take your regular medications prior to the test.  . Take metoprolol (Lopressor) two hours prior to test. . HOLD Furosemide/Hydrochlorothiazide morning of the test        After the Test: . Drink plenty of water. . After receiving IV contrast, you may experience a mild flushed feeling. This is normal. . On occasion, you may experience a mild rash up to 24 hours after the test. This is not dangerous. If this occurs, you can take Benadryl 25 mg and increase your fluid intake. . If you experience trouble breathing, this can be serious. If it is severe call 911 IMMEDIATELY. If it is mild, please call our office. . If you take any of these medications: Glipizide/Metformin, Avandament, Glucavance, please do not take 48 hours after completing test unless otherwise instructed.    Please contact the cardiac imaging nurse navigator should you have any questions/concerns Marchia Bond, RN Navigator Cardiac Imaging [facility] [phone] Office  [phone] Cell

## 2019-01-01 MED FILL — LISINOPRIL-HCTZ 20-25 MG TA: 20-25 | 30 days supply | Qty: 30 | Fill #3

## 2019-01-05 ENCOUNTER — Telehealth: Payer: Self-pay | Admitting: Internal Medicine

## 2019-01-07 NOTE — Telephone Encounter (Signed)
Left a message to call back a reschedule appointment

## 2019-01-16 ENCOUNTER — Ambulatory Visit: Payer: Self-pay | Admitting: Family Medicine

## 2019-02-09 MED FILL — LISINOPRIL-HCTZ 20-25 MG TA: 20-25 | 30 days supply | Qty: 30 | Fill #4

## 2019-02-09 MED FILL — TAMSULOSIN HCL 0.4 MG CAP: 0.4 | 30 days supply | Qty: 60 | Fill #1

## 2019-02-23 ENCOUNTER — Telehealth (HOSPITAL_COMMUNITY): Payer: Self-pay | Admitting: Emergency Medicine

## 2019-02-23 NOTE — Telephone Encounter (Signed)
Reaching out to patient to offer assistance regarding upcoming cardiac imaging study; pt verbalizes understanding of appt date/time, parking situation and where to check in, pre-test NPO status and medications ordered, and verified current allergies; name and call back number provided for further questions should they arise Tamirra Sienkiewicz RN Navigator Cardiac Imaging [facility] [phone] office [phone] cell 

## 2019-02-24 ENCOUNTER — Ambulatory Visit (HOSPITAL_COMMUNITY)
Admission: RE | Admit: 2019-02-24 | Discharge: 2019-02-24 | Disposition: A | Discharge status: Home / Self Care | Payer: Self-pay | Source: Ambulatory Visit | Attending: Cardiology | Admitting: Cardiology

## 2019-02-24 ENCOUNTER — Other Ambulatory Visit: Payer: Self-pay

## 2019-02-24 DIAGNOSIS — R072 Precordial pain: Secondary | ICD-10-CM

## 2019-02-24 MED ORDER — NITROGLYCERIN 0.4 MG SL SUBL
SUBLINGUAL_TABLET | SUBLINGUAL | Status: AC
  Filled 2019-02-24: qty 2

## 2019-02-24 MED ORDER — NITROGLYCERIN 0.4 MG SL SUBL
0.8000 mg | SUBLINGUAL_TABLET | Freq: Once | SUBLINGUAL | Status: AC
  Administered 2019-02-24: 0.8 mg via SUBLINGUAL

## 2019-02-24 MED ORDER — IOHEXOL 350 MG/ML SOLN
80.0000 mL | Freq: Once | INTRAVENOUS | Status: AC | PRN
  Administered 2019-02-24: 80 mL via INTRAVENOUS

## 2019-02-24 NOTE — Progress Notes (Signed)
Ct complete. PAtient denies any complaints. Offered snack and drink to patient.

## 2019-03-04 ENCOUNTER — Ambulatory Visit (INDEPENDENT_AMBULATORY_CARE_PROVIDER_SITE_OTHER): Payer: Self-pay | Admitting: Urology

## 2019-03-04 ENCOUNTER — Encounter: Payer: Self-pay | Admitting: Urology

## 2019-03-04 ENCOUNTER — Other Ambulatory Visit: Payer: Self-pay

## 2019-03-04 DIAGNOSIS — R300 Dysuria: Secondary | ICD-10-CM

## 2019-03-04 DIAGNOSIS — R399 Unspecified symptoms and signs involving the genitourinary system: Secondary | ICD-10-CM

## 2019-03-04 LAB — MICROSCOPIC EXAMINATION
Bacteria, UA: NONE SEEN
Epithelial Cells (non renal): NONE SEEN /hpf (ref 0–10)

## 2019-03-04 LAB — URINALYSIS, COMPLETE
Bilirubin, UA: NEGATIVE
Glucose, UA: NEGATIVE
Ketones, UA: NEGATIVE
Leukocytes,UA: NEGATIVE
Nitrite, UA: NEGATIVE
Protein,UA: NEGATIVE
RBC, UA: NEGATIVE
Specific Gravity, UA: 1.02 (ref 1.005–1.030)
Urobilinogen, Ur: 0.2 mg/dL (ref 0.2–1.0)
pH, UA: 7.5 (ref 5.0–7.5)

## 2019-03-04 NOTE — Progress Notes (Signed)
03/04/2019 1:47 PM   Kevin Little 1970-10-18 [id number]  Referring provider: Quentin Angst, MD [address] [place],  [place] [address]  Chief Complaint  Patient presents with  . Dysuria    HPI: 48 y.o. male was scheduled as a new patient appointment for dysuria and lower urinary tract symptoms however he was seen by me January 2019 for obstructive voiding symptoms.  At that time he was asymptomatic on tamsulosin.  He desired to stop the medication however noted recurrent symptoms and did not restart.  He saw his PCP in July and was started back on tamsulosin which was titrated to 0.8 mg.  As long as he takes this medication he has no problems and voids with an excellent stream.  When he stops the medication after 2 to 3 days he will note worsening obstructive symptoms along with mild dysuria.  He states he does not like to take regular medications and wanted to see if anything else could be done for his symptoms.  His PSA has been low and stable at <1.0.  Denies gross hematuria, flank, abdominal or pelvic pain   PMH: Past Medical History:  Diagnosis Date  . Herpes   . Herpes simplex   . Hypertension   . Sinus problem     Surgical History: No past surgical history on file.  Home Medications:  Allergies as of 03/04/2019      Reactions   Omeprazole Rash   Rabeprazole Sodium Rash   ( Aciphex)      Medication List       Accurate as of March 04, 2019  1:47 PM. If you have any questions, ask your nurse or doctor.        famotidine 10 MG tablet Commonly known as: PEPCID Take 10 mg by mouth 2 (two) times daily.   lisinopril-hydrochlorothiazide 20-25 MG tablet Commonly known as: ZESTORETIC Take 1 tablet by mouth daily.   metoprolol tartrate 100 MG tablet Commonly known as: LOPRESSOR TAKE 1 TABLET 2 HR PRIOR TO CARDIAC PROCEDURE   tamsulosin 0.4 MG Caps capsule Commonly known as: FLOMAX TAKE 2 CAPSULES (0.8 MG TOTAL) BY MOUTH DAILY.        Allergies:  Allergies  Allergen Reactions  . Omeprazole Rash  . Rabeprazole Sodium Rash    ( Aciphex)    Family History: Family History  Problem Relation Age of Onset  . Hypertension Mother   . Cancer Father        prostate    Social History:  reports that he has never smoked. He has never used smokeless tobacco. He reports that he does not drink alcohol or use drugs.  ROS: UROLOGY Frequent Urination?: No Hard to postpone urination?: No Burning/pain with urination?: Yes Get up at night to urinate?: No Leakage of urine?: No Urine stream starts and stops?: No Trouble starting stream?: No Do you have to strain to urinate?: Yes Blood in urine?: No Urinary tract infection?: Yes Sexually transmitted disease?: No Injury to kidneys or bladder?: No Painful intercourse?: No Weak stream?: No Erection problems?: No Penile pain?: No  Gastrointestinal Nausea?: No Vomiting?: No Indigestion/heartburn?: No Diarrhea?: No Constipation?: No  Constitutional Fever: No Night sweats?: No Weight loss?: No Fatigue?: No  Skin Skin rash/lesions?: No Itching?: No  Eyes Blurred vision?: Yes Double vision?: No  Ears/Nose/Throat Sore throat?: No Sinus problems?: No  Hematologic/Lymphatic Swollen glands?: No Easy bruising?: No  Cardiovascular Leg swelling?: No Chest pain?: No  Respiratory Cough?: No Shortness of  breath?: No  Endocrine Excessive thirst?: No  Musculoskeletal Back pain?: No Joint pain?: No  Neurological Headaches?: No Dizziness?: No  Psychologic Depression?: No Anxiety?: No  Physical Exam: BP (!) 143/88 (BP Location: Left Arm, Patient Position: Sitting, Cuff Size: Normal)   Pulse 72   Ht 5\' 11"  (1.803 m)   Wt 192 lb 4.8 oz (87.2 kg)   BMI 26.82 kg/m   Constitutional:  Alert and oriented, No acute distress. HEENT: [place] AT, moist mucus membranes.  Trachea midline, no masses. Cardiovascular: No clubbing, cyanosis, or edema. Respiratory:  Normal respiratory effort, no increased work of breathing. Skin: No rashes, bruises or suspicious lesions. Neurologic: Grossly intact, no focal deficits, moving all 4 extremities. Psychiatric: Normal mood and affect.  Laboratory Data:  Urinalysis Dipstick/microscopy negative Assessment & Plan:    - Lower urinary tract symptoms (LUTS) Most likely secondary to BPH versus increased bladder neck tone.  Significant improvement on tamsulosin.  He would like to try and stop tamsulosin.  I recommended cystoscopy to evaluate his outlet and to also assess for urethral stricture.  We discussed the possibility of surgical options however potential sexual side effects were also discussed.  - Dysuria Urinalyses have shown no evidence of infection. This may be secondary to prostatic inflammation since it is only present when he is not taking tamsulosin.  - Dipstick positive hematuria Urinalysis January 2019 and today have shown no significant RBCs on microscopy.  Based on American Urological Association practice guidelines asymptomatic microhematuria [facility]) is defined as three or greater red blood cells per high powered field on a properly collected urinary specimen in the absence of an obvious benign cause. A positive dipstick does not define AMH, and evaluation should be based solely on findings from microscopic examination of urinary sediment and not on a dipstick reading.   Abbie Sons, [place] [address] [place], [place] [address] [phone]

## 2019-03-05 ENCOUNTER — Encounter: Payer: Self-pay | Admitting: Urology

## 2019-03-10 ENCOUNTER — Other Ambulatory Visit: Payer: Self-pay

## 2019-03-10 ENCOUNTER — Encounter: Payer: Self-pay | Admitting: Family Medicine

## 2019-03-10 ENCOUNTER — Ambulatory Visit (INDEPENDENT_AMBULATORY_CARE_PROVIDER_SITE_OTHER): Payer: Self-pay | Admitting: Family Medicine

## 2019-03-10 VITALS — BP 133/76 | HR 70 | Temp 98.0°F | Ht 71.0 in | Wt 194.6 lb

## 2019-03-10 DIAGNOSIS — I10 Essential (primary) hypertension: Secondary | ICD-10-CM

## 2019-03-10 LAB — POCT URINALYSIS DIPSTICK
Bilirubin, UA: NEGATIVE
Glucose, UA: NEGATIVE
Ketones, UA: NEGATIVE
Leukocytes, UA: NEGATIVE
Nitrite, UA: NEGATIVE
Protein, UA: NEGATIVE
Spec Grav, UA: 1.025 (ref 1.010–1.025)
Urobilinogen, UA: 0.2 E.U./dL
pH, UA: 6 (ref 5.0–8.0)

## 2019-03-10 MED ORDER — LISINOPRIL-HYDROCHLOROTHIAZIDE 20-25 MG PO TABS: 1.0000 | ORAL_TABLET | Freq: Every day | ORAL | 1 refills | Status: DC

## 2019-03-10 MED FILL — LISINOPRIL-HCTZ 20-25 MG TA: 20-25 | 30 days supply | Qty: 30 | Fill #0

## 2019-03-10 NOTE — Patient Instructions (Signed)

## 2019-03-10 NOTE — Progress Notes (Signed)
[facility]    Established Patient Office Visit  Subjective:  Patient ID: Kevin Little, male    DOB: 09/29/1970  Age: 48 y.o. MRN: [id number]  CC:  Chief Complaint  Patient presents with  . Follow-up    HTN   Kevin Little, a 48 year old male with a medical history significant for hypertension presents for follow up. Patient says that he is doing well and is without complaint. He does not check blood pressure at home. Also, he is not following a lowfat, low salt diet or exercising.   Hypertension This is a chronic problem. The current episode started more than 1 year ago. The problem has been gradually improving since onset. The problem is controlled. Pertinent negatives include no anxiety, blurred vision, chest pain, headaches, malaise/fatigue, neck pain, orthopnea, palpitations, peripheral edema, PND, shortness of breath or sweats. Risk factors for coronary artery disease include male gender and obesity. The current treatment provides moderate improvement. Compliance problems include diet and exercise.  There is no history of kidney disease, CAD/MI, CVA or PVD. There is no history of a thyroid problem.    Past Medical History:  Diagnosis Date  . Herpes   . Herpes simplex   . Hypertension   . Sinus problem     History reviewed. No pertinent surgical history.  Family History  Problem Relation Age of Onset  . Hypertension Mother   . Cancer Father        prostate    Social History   Socioeconomic History  . Marital status: Married    Spouse name: Not on file  . Number of children: Not on file  . Years of education: Not on file  . Highest education level: Not on file  Occupational History  . Not on file  Tobacco Use  . Smoking status: Never Smoker  . Smokeless tobacco: Never Used  Substance and Sexual Activity  . Alcohol use: No  . Drug use: No  . Sexual activity: Yes    Birth control/protection: None  Other  Topics Concern  . Not on file  Social History Narrative   ** Merged History Encounter **       Social Determinants of Health   Financial Resource Strain:   . Difficulty of Paying Living Expenses: Not on file  Food Insecurity:   . Worried About Programme researcher, broadcasting/film/videounning Out of Food in the Last Year: Not on file  . Ran Out of Food in the Last Year: Not on file  Transportation Needs:   . Lack of Transportation (Medical): Not on file  . Lack of Transportation (Non-Medical): Not on file  Physical Activity:   . Days of Exercise per Week: Not on file  . Minutes of Exercise per Session: Not on file  Stress:   . Feeling of Stress : Not on file  Social Connections:   . Frequency of Communication with Friends and Family: Not on file  . Frequency of Social Gatherings with Friends and Family: Not on file  . Attends Religious Services: Not on file  . Active Member of Clubs or Organizations: Not on file  . Attends BankerClub or Organization Meetings: Not on file  . Marital Status: Not on file  Intimate Partner Violence:   . Fear of Current or Ex-Partner: Not on file  . Emotionally Abused: Not on file  . Physically Abused: Not on file  . Sexually Abused: Not on file    Outpatient Medications Prior to Visit  Medication Sig Dispense Refill  . famotidine (PEPCID) 10 MG tablet Take 10 mg by mouth 2 (two) times daily.    . metoprolol tartrate (LOPRESSOR) 100 MG tablet TAKE 1 TABLET 2 HR PRIOR TO CARDIAC PROCEDURE 1 tablet 0  . tamsulosin (FLOMAX) 0.4 MG CAPS capsule TAKE 2 CAPSULES (0.8 MG TOTAL) BY MOUTH DAILY. 60 capsule 1  . lisinopril-hydrochlorothiazide (ZESTORETIC) 20-25 MG tablet Take 1 tablet by mouth daily. 90 tablet 1   No facility-administered medications prior to visit.    Allergies  Allergen Reactions  . Omeprazole Rash  . Rabeprazole Sodium Rash    ( Aciphex)    ROS Review of Systems  Constitutional: Negative.  Negative for malaise/fatigue.  HENT: Negative.   Eyes: Negative.  Negative for  blurred vision.  Respiratory: Negative for shortness of breath.   Cardiovascular: Negative for chest pain, palpitations, orthopnea and PND.  Endocrine: Negative.   Genitourinary: Negative.   Musculoskeletal: Negative for neck pain.  Allergic/Immunologic: Negative.   Neurological: Negative for headaches.      Objective:    Physical Exam  Constitutional: He is oriented to person, place, and time. He appears well-developed and well-nourished.  HENT:  Head: Normocephalic and atraumatic.  Eyes: Pupils are equal, round, and reactive to light.  Cardiovascular: Normal rate, regular rhythm and normal heart sounds.  Pulmonary/Chest: Effort normal and breath sounds normal.  Abdominal: Soft. Bowel sounds are normal.  Musculoskeletal:        General: Normal range of motion.     Cervical back: Normal range of motion.  Neurological: He is alert and oriented to person, place, and time.  Skin: Skin is warm and dry.  Psychiatric: He has a normal mood and affect. His behavior is normal. Thought content normal.    BP 133/76   Pulse 70   Temp 98 F (36.7 C)   Ht 5\' 11"  (1.803 m)   Wt 194 lb 9.6 oz (88.3 kg)   SpO2 98%   BMI 27.14 kg/m  Wt Readings from Last 3 Encounters:  03/10/19 194 lb 9.6 oz (88.3 kg)  03/04/19 192 lb 4.8 oz (87.2 kg)  12/29/18 193 lb (87.5 kg)     There are no preventive care reminders to display for this patient.  There are no preventive care reminders to display for this patient.  Lab Results  Component Value Date   TSH 1.010 05/07/2018   Lab Results  Component Value Date   WBC 3.3 (L) 05/07/2018   HGB 14.3 05/07/2018   HCT 43.3 05/07/2018   MCV 85 05/07/2018   PLT 208 10/30/2016   Lab Results  Component Value Date   NA 142 05/07/2018   K 4.1 05/07/2018   CO2 30 (H) 05/07/2018   GLUCOSE 88 05/07/2018   BUN 9 05/07/2018   CREATININE 1.00 05/07/2018   BILITOT 0.7 05/07/2018   ALKPHOS 77 05/07/2018   AST 20 05/07/2018   ALT 25 05/07/2018   PROT  7.6 05/07/2018   ALBUMIN 4.2 05/07/2018   CALCIUM 9.4 05/07/2018   Lab Results  Component Value Date   CHOL 197 05/07/2018   Lab Results  Component Value Date   HDL 46 05/07/2018   Lab Results  Component Value Date   LDLCALC 125 (H) 05/07/2018   Lab Results  Component Value Date   TRIG 131 05/07/2018   Lab Results  Component Value Date   CHOLHDL 4.3 05/07/2018   Lab Results  Component Value Date   HGBA1C 5.5 04/30/2018  Assessment & Plan:   Problem List Items Addressed This Visit      Cardiovascular and Mediastinum   Essential hypertension - Primary   Relevant Medications   lisinopril-hydrochlorothiazide (ZESTORETIC) 20-25 MG tablet   Other Relevant Orders   Urinalysis Dipstick (Completed)      Meds ordered this encounter  Medications  . lisinopril-hydrochlorothiazide (ZESTORETIC) 20-25 MG tablet    Sig: Take 1 tablet by mouth daily.    Dispense:  90 tablet    Refill:  1    Order Specific Question:   Supervising Provider    Answer:   Tresa Garter [id number]    Essential hypertension Blood pressure at goal. Continue medication, monitor blood pressure at home. Continue DASH diet. Reminder to go to the ER if any CP, SOB, nausea, dizziness, severe HA, changes vision/speech, left arm numbness and tingling and jaw pain.  - Urinalysis Dipstick - lisinopril-hydrochlorothiazide (ZESTORETIC) 20-25 MG tablet; Take 1 tablet by mouth daily.  Dispense: 90 tablet; Refill: 1 - Basic Metabolic Panel   Follow-up: Return in 6 months for follow up of hypertension   Jefferson Davis, MSN, FNP-C [facility] [place] [address] [place], [place] [address] [phone]

## 2019-03-11 LAB — BASIC METABOLIC PANEL
BUN/Creatinine Ratio: 13 (ref 9–20)
BUN: 14 mg/dL (ref 6–24)
CO2: 24 mmol/L (ref 20–29)
Calcium: 9.6 mg/dL (ref 8.7–10.2)
Chloride: 101 mmol/L (ref 96–106)
Creatinine, Ser: 1.07 mg/dL (ref 0.76–1.27)
GFR calc Af Amer: 94 mL/min/{1.73_m2} (ref >59)
GFR calc non Af Amer: 82 mL/min/{1.73_m2} (ref >59)
Glucose: 98 mg/dL (ref 65–99)
Potassium: 3.9 mmol/L (ref 3.5–5.2)
Sodium: 140 mmol/L (ref 134–144)

## 2019-03-17 ENCOUNTER — Telehealth: Payer: Self-pay

## 2019-03-17 NOTE — Telephone Encounter (Signed)
-----   Message from Dorena Dew, [place] sent at 03/11/2019  5:50 AM EST ----- Please inform patient that all labs are within a normal range. Continue medication, monitor blood pressure at home. Continue DASH diet. Reminder to go to the ER if any CP, SOB, nausea, dizziness, severe HA, changes vision/speech, left arm numbness and tingling and jaw pain. Follow up in office as scheduled.     Donia Pounds  APRN, MSN, FNP-C [facility] [place] [address] [place], [place] [address] [phone]

## 2019-03-17 NOTE — Telephone Encounter (Signed)
Two message left for patient to call back for test results.

## 2019-03-23 ENCOUNTER — Other Ambulatory Visit: Payer: Self-pay | Admitting: Internal Medicine

## 2019-03-23 DIAGNOSIS — R300 Dysuria: Secondary | ICD-10-CM

## 2019-03-23 MED FILL — LISINOPRIL-HCTZ 20-25 MG TA: 20-25 | 30 days supply | Qty: 30 | Fill #0

## 2019-03-24 MED FILL — TAMSULOSIN HCL 0.4 MG CAP: 0.4 | 30 days supply | Qty: 60 | Fill #0

## 2019-04-08 ENCOUNTER — Ambulatory Visit (INDEPENDENT_AMBULATORY_CARE_PROVIDER_SITE_OTHER): Payer: Self-pay | Admitting: Urology

## 2019-04-08 ENCOUNTER — Other Ambulatory Visit: Payer: Self-pay

## 2019-04-08 ENCOUNTER — Encounter: Payer: Self-pay | Admitting: Urology

## 2019-04-08 VITALS — BP 140/78 | HR 92 | Ht 71.0 in | Wt 192.0 lb

## 2019-04-08 DIAGNOSIS — R399 Unspecified symptoms and signs involving the genitourinary system: Secondary | ICD-10-CM

## 2019-04-08 LAB — URINALYSIS, COMPLETE
Bilirubin, UA: NEGATIVE
Glucose, UA: NEGATIVE
Ketones, UA: NEGATIVE
Leukocytes,UA: NEGATIVE
Nitrite, UA: NEGATIVE
Protein,UA: NEGATIVE
Specific Gravity, UA: 1.025 (ref 1.005–1.030)
Urobilinogen, Ur: 0.2 mg/dL (ref 0.2–1.0)
pH, UA: 6.5 (ref 5.0–7.5)

## 2019-04-08 LAB — MICROSCOPIC EXAMINATION
Bacteria, UA: NONE SEEN
Epithelial Cells (non renal): NONE SEEN /hpf (ref 0–10)

## 2019-04-08 NOTE — Progress Notes (Signed)
   04/08/19  CC:  Chief Complaint  Patient presents with  . Cysto    HPI: Refer to my previous note 03/04/2019.  Blood pressure 140/78, pulse 92, height 5\' 11"  (1.803 m), weight 192 lb (87.1 kg). NED. A&Ox3.   No respiratory distress   Abd soft, NT, ND Normal phallus with bilateral descended testicles  Cystoscopy Procedure Note  Patient identification was confirmed, informed consent was obtained, and patient was prepped using Betadine solution.  Lidocaine jelly was administered per urethral meatus.     Pre-Procedure: - Inspection reveals a normal caliber urethral meatus.  Procedure: The flexible cystoscope was introduced without difficulty - No urethral strictures/lesions are present. - Minimal lateral lobe enlargement prostate  - Normal bladder neck - Bilateral ureteral orifices identified - Bladder mucosa  reveals no ulcers, tumors, or lesions - No bladder stones - No trabeculation  Retroflexion shows no intravesical median lobe   Post-Procedure: - Patient tolerated the procedure well  Assessment/ Plan: No significant outlet occlusion.  He is asymptomatic while taking tamsulosin and would recommend he continue.  If he does not desire to continue medication would recommend a urodynamic study.   , MD

## 2019-04-12 ENCOUNTER — Encounter: Payer: Self-pay | Admitting: Urology

## 2019-04-19 ENCOUNTER — Emergency Department (HOSPITAL_COMMUNITY)
Admission: EM | Admit: 2019-04-19 | Discharge: 2019-04-19 | Disposition: A | Discharge status: Home / Self Care | Payer: Self-pay | Attending: Emergency Medicine | Admitting: Emergency Medicine

## 2019-04-19 ENCOUNTER — Emergency Department (HOSPITAL_COMMUNITY): Payer: Self-pay

## 2019-04-19 ENCOUNTER — Encounter (HOSPITAL_COMMUNITY): Payer: Self-pay

## 2019-04-19 ENCOUNTER — Other Ambulatory Visit: Payer: Self-pay

## 2019-04-19 DIAGNOSIS — I10 Essential (primary) hypertension: Secondary | ICD-10-CM | POA: Insufficient documentation

## 2019-04-19 DIAGNOSIS — R202 Paresthesia of skin: Secondary | ICD-10-CM

## 2019-04-19 DIAGNOSIS — Z79899 Other long term (current) drug therapy: Secondary | ICD-10-CM | POA: Insufficient documentation

## 2019-04-19 DIAGNOSIS — R9431 Abnormal electrocardiogram [ECG] [EKG]: Secondary | ICD-10-CM

## 2019-04-19 LAB — CBC
HCT: 45.5 % (ref 39.0–52.0)
Hemoglobin: 15 g/dL (ref 13.0–17.0)
MCH: 29.6 pg (ref 26.0–34.0)
MCHC: 33 g/dL (ref 30.0–36.0)
MCV: 89.9 fL (ref 80.0–100.0)
Platelets: 183 10*3/uL (ref 150–400)
RBC: 5.06 MIL/uL (ref 4.22–5.81)
RDW: 12.6 % (ref 11.5–15.5)
WBC: 3.5 10*3/uL — ABNORMAL LOW (ref 4.0–10.5)
nRBC: 0 % (ref 0.0–0.2)

## 2019-04-19 LAB — BASIC METABOLIC PANEL
Anion gap: 9 (ref 5–15)
BUN: 9 mg/dL (ref 6–20)
CO2: 29 mmol/L (ref 22–32)
Calcium: 9.5 mg/dL (ref 8.9–10.3)
Chloride: 100 mmol/L (ref 98–111)
Creatinine, Ser: 1.1 mg/dL (ref 0.61–1.24)
GFR calc Af Amer: >60 mL/min (ref >60)
GFR calc non Af Amer: >60 mL/min (ref >60)
Glucose, Bld: 112 mg/dL — ABNORMAL HIGH (ref 70–99)
Potassium: 3.5 mmol/L (ref 3.5–5.1)
Sodium: 138 mmol/L (ref 135–145)

## 2019-04-19 NOTE — Discharge Instructions (Signed)
NO evidence of stroke EKG with some t wave abnormalities- I do not think this is pertinent to today's concern, but you should follow up with your doctor to decide if any further heart testing is needed.

## 2019-04-19 NOTE — ED Provider Notes (Signed)
[facility] EMERGENCY DEPARTMENT Provider Note   CSN: [id number] Arrival date & time: 04/19/19  1302     History No chief complaint on file.   Kevin Little is a 49 y.o. male.  HPI 49 year old male history of hypertension presents today complaining of right-sided numbness that began on Friday.  Describes as constant nature.  Denies any weakness, difficulty speaking, difficulty seeing, difficulty walking, or swallowing.  Denies any similar symptoms in the past or history of stroke    Past Medical History:  Diagnosis Date  . Herpes   . Herpes simplex   . Hypertension   . Sinus problem     Patient Active Problem List   Diagnosis Date Noted  . Need for immunization against influenza 01/04/2014  . Need for Tdap vaccination 10/04/2013  . Midline low back pain without sciatica 10/04/2013  . CHEST PAIN 02/16/2010  . DERMATITIS DUE DRUGS&MEDICINES TAKEN INTERNALLY 08/05/2008  . Essential hypertension 01/21/2007  . GASTRITIS 01/21/2007  . ARTHRITIS 01/21/2007    History reviewed. No pertinent surgical history.     Family History  Problem Relation Age of Onset  . Hypertension Mother   . Cancer Father        prostate    Social History   Tobacco Use  . Smoking status: Never Smoker  . Smokeless tobacco: Never Used  Substance Use Topics  . Alcohol use: No  . Drug use: No    Home Medications Prior to Admission medications   Medication Sig Start Date End Date Taking? Authorizing Provider  famotidine (PEPCID) 10 MG tablet Take 10 mg by mouth 2 (two) times daily.    [provider]  lisinopril-hydrochlorothiazide (ZESTORETIC) 20-25 MG tablet Take 1 tablet by mouth daily. 03/10/19   Dorena Dew, FNP  metoprolol tartrate (LOPRESSOR) 100 MG tablet TAKE 1 TABLET 2 HR PRIOR TO CARDIAC PROCEDURE 12/29/18   Donato Heinz, MD  tamsulosin (FLOMAX) 0.4 MG CAPS capsule TAKE 2 CAPSULES (0.8 MG TOTAL) BY MOUTH DAILY. 03/23/19   Dorena Dew, FNP    Allergies    Omeprazole and Rabeprazole sodium  Review of Systems   Review of Systems  All other systems reviewed and are negative.   Physical Exam Updated Vital Signs BP (!) 140/105 (BP Location: Right Arm)   Pulse 72   Temp 98.4 F (36.9 C) (Oral)   Resp 16   SpO2 100%   Physical Exam Vitals and nursing note reviewed.  Constitutional:      Appearance: Normal appearance.  HENT:     Head: Normocephalic and atraumatic.     Right Ear: External ear normal.     Left Ear: External ear normal.     Nose: Nose normal.     Mouth/Throat:     Mouth: Mucous membranes are moist.  Eyes:     Extraocular Movements: Extraocular movements intact.     Pupils: Pupils are equal, round, and reactive to light.  Cardiovascular:     Rate and Rhythm: Normal rate and regular rhythm.     Pulses: Normal pulses.     Heart sounds: Normal heart sounds.  Pulmonary:     Effort: Pulmonary effort is normal.     Breath sounds: Normal breath sounds.  Abdominal:     General: Abdomen is flat.     Palpations: Abdomen is soft.  Musculoskeletal:        General: Normal range of motion.     Cervical back: Normal range of motion and  neck supple.  Skin:    General: Skin is warm and dry.     Capillary Refill: Capillary refill takes less than 2 seconds.  Neurological:     General: No focal deficit present.     Mental Status: He is alert and oriented to person, place, and time.     Cranial Nerves: No cranial nerve deficit.     Motor: No weakness.     Comments: Patient with good strength bilaterally.  No palmar drift.  Normal gait.  Deep tendon reflexes equal bilateral patellas and elbows. Sharp sensation and soft sensation tested and equal bilaterally without any areas of decreased sensation.  Psychiatric:        Mood and Affect: Mood normal.        Behavior: Behavior normal.     ED Results / Procedures / Treatments   Labs (all labs ordered are listed, but only abnormal results are  displayed) Labs Reviewed - No data to display  EKG EKG Interpretation  Date/Time:  Sunday April 19 2019 14:40:40 EST Ventricular Rate:  60 PR Interval:    QRS Duration: 100 QT Interval:  401 QTC Calculation: 401 R Axis:   28 Text Interpretation: Sinus rhythm Abnormal R-wave progression, early transition Borderline T abnormalities, diffuse leads Confirmed by Margarita Grizzle [id number]) on 04/19/2019 3:26:06 PM   Radiology CT Head Wo Contrast  Result Date: 04/19/2019 CLINICAL DATA:  Focal neuro deficit greater than 6 hours. Right-sided tingling for 2 days. EXAM: CT HEAD WITHOUT CONTRAST TECHNIQUE: Contiguous axial images were obtained from the base of the skull through the vertex without intravenous contrast. COMPARISON:  None. FINDINGS: Brain: No evidence of acute infarction, hemorrhage, hydrocephalus, extra-axial collection or mass lesion/mass effect. Vascular: No hyperdense vessel or unexpected calcification. Skull: Normal. Negative for fracture or focal lesion. Sinuses/Orbits: No acute finding. Other: None. IMPRESSION: Normal brain.  No cause for the patient's symptoms identified. Electronically Signed   By: Gerome Sam III M.D   On: 04/19/2019 14:35    Procedures Procedures (including critical care time)  Medications Ordered in ED Medications - No data to display  ED Course  I have reviewed the triage vital signs and the nursing notes.  Pertinent labs & imaging results that were available during my care of the patient were reviewed by me and considered in my medical decision making (see chart for details).    MDM Rules/Calculators/A&P                      Patient presented with right side numbness.  Normal neuro including light and sharp sensation.  No evidence of stroke.   EKG without evidence of a fib.  Some abnormalities noted but no old and no chest pain here.  Will advise of need for follow up and discussed return precautions.  Patient voices understanding.  Final  Clinical Impression(s) / ED Diagnoses Final diagnoses:  Paresthesia  Abnormal EKG    Rx / DC Orders ED Discharge Orders    None       Margarita Grizzle, MD 04/19/19 1530

## 2019-04-19 NOTE — ED Triage Notes (Signed)
Patient complains of right sided tingling to entire body since Friday. No trouble ambulating, grip equal. No trouble with grip, speech clear

## 2019-04-21 ENCOUNTER — Encounter: Payer: Self-pay | Admitting: Family Medicine

## 2019-04-21 ENCOUNTER — Ambulatory Visit (INDEPENDENT_AMBULATORY_CARE_PROVIDER_SITE_OTHER): Payer: Self-pay | Admitting: Family Medicine

## 2019-04-21 ENCOUNTER — Other Ambulatory Visit: Payer: Self-pay

## 2019-04-21 VITALS — BP 130/88 | HR 74 | Temp 98.4°F | Resp 16 | Ht 71.0 in | Wt 193.0 lb

## 2019-04-21 DIAGNOSIS — E7849 Other hyperlipidemia: Secondary | ICD-10-CM

## 2019-04-21 DIAGNOSIS — I10 Essential (primary) hypertension: Secondary | ICD-10-CM

## 2019-04-21 MED ORDER — ASPIRIN EC 81 MG PO TBEC: 81.0000 mg | DELAYED_RELEASE_TABLET | Freq: Every day | ORAL | 11 refills | Status: AC

## 2019-04-21 NOTE — Patient Instructions (Signed)
Patient is complaining  Preventing High Cholesterol Cholesterol is a white, waxy substance similar to fat that the human body needs to help build cells. The liver makes all the cholesterol that a person's body needs. Having high cholesterol (hypercholesterolemia) increases a person's risk for heart disease and stroke. Extra (excess) cholesterol comes from the food the person eats. High cholesterol can often be prevented with diet and lifestyle changes. If you already have high cholesterol, you can control it with diet and lifestyle changes and with medicine. How can high cholesterol affect me? If you have high cholesterol, deposits (plaques) may build up on the walls of your arteries. The arteries are the blood vessels that carry blood away from your heart. Plaques make the arteries narrower and stiffer. This can limit or block blood flow and cause blood clots to form. Blood clots:  Are tiny balls of cells that form in your blood.  Can move to the heart or brain, causing a heart attack or stroke. Plaques in arteries greatly increase your risk for heart attack and stroke.Making diet and lifestyle changes can reduce your risk for these conditions that may threaten your life. What can increase my risk? This condition is more likely to develop in people who:  Eat foods that are high in saturated fat or cholesterol. Saturated fat is mostly found in: ? Foods that contain animal fat, such as red meat and some dairy products. ? Certain fatty foods made from plants, such as tropical oils.  Are overweight.  Are not getting enough exercise.  Have a family history of high cholesterol. What actions can I take to prevent this? Nutrition   Eat less saturated fat.  Avoid trans fats (partially hydrogenated oils). These are often found in margarine and in some baked goods, fried foods, and snacks bought in packages.  Avoid precooked or cured meat, such as sausages or meat loaves.  Avoid foods and  drinks that have added sugars.  Eat more fruits, vegetables, and whole grains.  Choose healthy sources of protein, such as fish, poultry, lean cuts of red meat, beans, peas, lentils, and nuts.  Choose healthy sources of fat, such as: ? Nuts. ? Vegetable oils, especially olive oil. ? Fish that have healthy fats (omega-3 fatty acids), such as mackerel or salmon. The items listed above may not be a complete list of recommended foods and beverages. Contact a dietitian for more information. Lifestyle  Lose weight if you are overweight. Losing 5-10 lb (2.3-4.5 kg) can help prevent or control high cholesterol. It can also lower your risk for diabetes and high blood pressure. Ask your health care provider to help you with a diet and exercise plan to lose weight safely.  Do not use any products that contain nicotine or tobacco, such as cigarettes, e-cigarettes, and chewing tobacco. If you need help quitting, ask your health care provider.  Limit your alcohol intake. ? Do not drink alcohol if:  Your health care provider tells you not to drink.  You are pregnant, may be pregnant, or are planning to become pregnant. ? If you drink alcohol:  Limit how much you use to:  0-1 drink a day for women.  0-2 drinks a day for men.  Be aware of how much alcohol is in your drink. In the [place] one drink equals one 12 oz bottle of beer (355 mL), one 5 oz glass of wine (148 mL), or one 1 oz glass of hard liquor (44 mL). Activity   Get enough exercise.  Each week, do at least 150 minutes of exercise that takes a medium level of effort (moderate-intensity exercise). ? This is exercise that:  Makes your heart beat faster and makes you breathe harder than usual.  Allows you to still be able to talk. ? You could exercise in short sessions several times a day or longer sessions a few times a week. For example, on 5 days each week, you could walk fast or ride your bike 3 times a day for 10 minutes each  time.  Do exercises as told by your health care provider. Medicines  In addition to diet and lifestyle changes, your health care provider may recommend medicines to help lower cholesterol. This may be a medicine to lower the amount of cholesterol your liver makes. You may need medicine if: ? Diet and lifestyle changes do not lower your cholesterol enough. ? You have high cholesterol and other risk factors for heart disease or stroke.  Take over-the-counter and prescription medicines only as told by your health care provider. General information  Manage your risk factors for high cholesterol. Talk with your health care provider about all your risk factors and how to lower your risk.  Manage other conditions that you have, such as diabetes or high blood pressure (hypertension).  Have blood tests to check your cholesterol levels at regular points in time as told by your health care provider.  Keep all follow-up visits as told by your health care provider. This is important. Where to find more information  American Heart Association: www.heart.org  National Heart, Lung, and Blood Institute: PopSte[employer].is Summary  High cholesterol increases your risk for heart disease and stroke. By keeping your cholesterol level low, you can reduce your risk for these conditions.  High cholesterol can often be prevented with diet and lifestyle changes.  Work with your health care provider to manage your risk factors, and have your blood tested regularly. This information is not intended to replace advice given to you by your health care provider. Make sure you discuss any questions you have with your health care provider. Document Revised: 06/27/2018 Document Reviewed: 11/12/2015 Elsevier Patient Education  2020 [employer].

## 2019-04-22 LAB — LIPID PANEL
Chol/HDL Ratio: 3.3 ratio (ref 0.0–5.0)
Cholesterol, Total: 187 mg/dL (ref 100–199)
HDL: 56 mg/dL (ref >39)
LDL Chol Calc (NIH): 110 mg/dL — ABNORMAL HIGH (ref 0–99)
Triglycerides: 120 mg/dL (ref 0–149)
VLDL Cholesterol Cal: 21 mg/dL (ref 5–40)

## 2019-04-24 ENCOUNTER — Telehealth: Payer: Self-pay

## 2019-04-24 NOTE — Telephone Encounter (Signed)
-----   Message from [name], [place] sent at 04/24/2019  3:56 PM EST ----- Regarding: lab results Please inform patient that LDL cholesterol is elevated at 110, goal is to be less than 100. Recommed low fat diet and moderate physical activity several times per week. Will recheck cholesterol in 6 months.   Follow up in clinic as scheduled.   Nolon Nations  APRN, MSN, FNP-C [facility] [address] [place], [place] [address] [phone]

## 2019-04-24 NOTE — Telephone Encounter (Signed)
Patient returned call. I advised that cholesterol was elevated and to work on diet and exercise and we will recheck in 6 months. Patient verbalized understanding. Thanks!

## 2019-04-24 NOTE — Telephone Encounter (Signed)
Called, no answer. Will try later.  

## 2019-05-03 NOTE — Progress Notes (Signed)
[facility]     Subjective:  Patient ID: Kevin Little, male    DOB: 11/30/1970  Age: 49 y.o. MRN: [id number]  CC:  Chief Complaint  Patient presents with  . Follow-up    er follow up for tingling    HPI Kevin Little, a 49 year old male with a medical history significant for essential hypertension presents for a post hospital follow up. Patient presented to ER on 04/19/2019 for right sided numbness that presented several days prior. Patient say that he was in usual state of health prior to sudden onset of right sided numbness. He denies weakness, difficulty speaking, blurred vision, abnormal gait, facial drooping, or dysphagia. He reports a family history of stroke. He says that symptoms have improved.  He has a history of hypertension and has been taking medication consistently. He does not follow a low fat diet or exercise consistently.  Past Medical History:  Diagnosis Date  . Herpes   . Herpes simplex   . Hypertension   . Sinus problem     History reviewed. No pertinent surgical history.  Family History  Problem Relation Age of Onset  . Hypertension Mother   . Cancer Father        prostate    Social History   Socioeconomic History  . Marital status: Married    Spouse name: Not on file  . Number of children: Not on file  . Years of education: Not on file  . Highest education level: Not on file  Occupational History  . Not on file  Tobacco Use  . Smoking status: Never Smoker  . Smokeless tobacco: Never Used  Substance and Sexual Activity  . Alcohol use: No  . Drug use: No  . Sexual activity: Yes    Birth control/protection: None  Other Topics Concern  . Not on file  Social History Narrative   ** Merged History Encounter **       Social Determinants of Health   Financial Resource Strain:   . Difficulty of Paying Living Expenses: Not on file  Food Insecurity:   . Worried About Programme researcher, broadcasting/film/video in  the Last Year: Not on file  . Ran Out of Food in the Last Year: Not on file  Transportation Needs:   . Lack of Transportation (Medical): Not on file  . Lack of Transportation (Non-Medical): Not on file  Physical Activity:   . Days of Exercise per Week: Not on file  . Minutes of Exercise per Session: Not on file  Stress:   . Feeling of Stress : Not on file  Social Connections:   . Frequency of Communication with Friends and Family: Not on file  . Frequency of Social Gatherings with Friends and Family: Not on file  . Attends Religious Services: Not on file  . Active Member of Clubs or Organizations: Not on file  . Attends Banker Meetings: Not on file  . Marital Status: Not on file  Intimate Partner Violence:   . Fear of Current or Ex-Partner: Not on file  . Emotionally Abused: Not on file  . Physically Abused: Not on file  . Sexually Abused: Not on file    Outpatient Medications Prior to Visit  Medication Sig Dispense Refill  . lisinopril-hydrochlorothiazide (ZESTORETIC) 20-25 MG tablet Take 1 tablet by mouth daily. 90 tablet 1  . tamsulosin (FLOMAX) 0.4 MG CAPS capsule TAKE 2 CAPSULES (0.8 MG TOTAL) BY MOUTH DAILY. 60  capsule 1  . metoprolol tartrate (LOPRESSOR) 100 MG tablet TAKE 1 TABLET 2 HR PRIOR TO CARDIAC PROCEDURE (Patient not taking: Reported on 04/19/2019) 1 tablet 0   No facility-administered medications prior to visit.    Allergies  Allergen Reactions  . Omeprazole Rash  . Rabeprazole Sodium Rash    ( Aciphex)    ROS Review of Systems  Constitutional: Negative.   HENT: Negative.   Eyes: Negative.   Respiratory: Negative.   Cardiovascular: Negative.  Negative for chest pain and leg swelling.  Endocrine: Negative.   Genitourinary: Negative.   Musculoskeletal: Negative.   Skin: Negative.   Neurological: Positive for numbness.  Hematological: Negative.   Psychiatric/Behavioral: Negative.       Objective:    Physical Exam  Constitutional:  He is oriented to person, place, and time. He appears well-developed and well-nourished.  HENT:  Head: Normocephalic.  Eyes: Pupils are equal, round, and reactive to light.  Cardiovascular: Regular rhythm.  Pulmonary/Chest: Effort normal and breath sounds normal.  Abdominal: Soft. Bowel sounds are normal.  Musculoskeletal:        General: Normal range of motion.     Cervical back: Normal range of motion.  Neurological: He is alert and oriented to person, place, and time.  Skin: Skin is warm and dry.  Psychiatric: He has a normal mood and affect. His behavior is normal. Judgment and thought content normal.    BP 130/88 (BP Location: Right Arm, Patient Position: Sitting, Cuff Size: Large)   Pulse 74   Temp 98.4 F (36.9 C) (Oral)   Resp 16   Ht 5\' 11"  (1.803 m)   Wt 193 lb (87.5 kg)   SpO2 100%   BMI 26.92 kg/m  Wt Readings from Last 3 Encounters:  04/21/19 193 lb (87.5 kg)  04/08/19 192 lb (87.1 kg)  03/10/19 194 lb 9.6 oz (88.3 kg)     There are no preventive care reminders to display for this patient.  There are no preventive care reminders to display for this patient.  Lab Results  Component Value Date   TSH 1.010 05/07/2018   Lab Results  Component Value Date   WBC 3.5 (L) 04/19/2019   HGB 15.0 04/19/2019   HCT 45.5 04/19/2019   MCV 89.9 04/19/2019   PLT 183 04/19/2019   Lab Results  Component Value Date   NA 138 04/19/2019   K 3.5 04/19/2019   CO2 29 04/19/2019   GLUCOSE 112 (H) 04/19/2019   BUN 9 04/19/2019   CREATININE 1.10 04/19/2019   BILITOT 0.7 05/07/2018   ALKPHOS 77 05/07/2018   AST 20 05/07/2018   ALT 25 05/07/2018   PROT 7.6 05/07/2018   ALBUMIN 4.2 05/07/2018   CALCIUM 9.5 04/19/2019   ANIONGAP 9 04/19/2019   Lab Results  Component Value Date   CHOL 187 04/21/2019   Lab Results  Component Value Date   HDL 56 04/21/2019   Lab Results  Component Value Date   LDLCALC 110 (H) 04/21/2019   Lab Results  Component Value Date    TRIG 120 04/21/2019   Lab Results  Component Value Date   CHOLHDL 3.3 04/21/2019   Lab Results  Component Value Date   HGBA1C 5.5 04/30/2018      Assessment & Plan:   Problem List Items Addressed This Visit      Cardiovascular and Mediastinum   Essential hypertension - Primary   Relevant Medications   aspirin EC 81 MG tablet    Other  Visit Diagnoses    Other hyperlipidemia       Relevant Medications   aspirin EC 81 MG tablet   Other Relevant Orders   Lipid Panel (Completed)      Meds ordered this encounter  Medications  . aspirin EC 81 MG tablet    Sig: Take 1 tablet (81 mg total) by mouth daily.    Dispense:  30 tablet    Refill:  11    Order Specific Question:   Supervising Provider    Answer:   Tresa Garter [[id number]]   Essential hypertension Continue medication, monitor blood pressure at home. Continue DASH diet. Reminder to go to the ER if any CP, SOB, nausea, dizziness, severe HA, changes vision/speech, left arm numbness and tingling and jaw pain.   Other hyperlipidemia The 10-year ASCVD risk score Mikey Bussing DC Brooke Bonito., et al., 2013) is: 8.2%   Values used to calculate the score:     Age: 56 years     Sex: Male     Is Non-Hispanic African American: Yes     Diabetic: No     Tobacco smoker: No     Systolic Blood Pressure: 295 mmHg     Is BP treated: Yes     HDL Cholesterol: 56 mg/dL     Total Cholesterol: 187 mg/dL - Lipid Panel - aspirin EC 81 MG tablet; Take 1 tablet (81 mg total) by mouth daily.  Dispense: 30 tablet; Refill: 11   Follow-up: Return in about 3 months (around 07/19/2019) for hypertension.    Donia Pounds  APRN, MSN, FNP-C [facility] [address] [place], [place] [address] [phone]

## 2019-05-04 MED FILL — TAMSULOSIN HCL 0.4 MG CAP: 0.4 | 30 days supply | Qty: 60 | Fill #1

## 2019-05-04 MED FILL — LISINOPRIL-HCTZ 20-25 MG TA: 20-25 | 30 days supply | Qty: 30 | Fill #1

## 2019-06-15 MED FILL — TAMSULOSIN HCL 0.4 MG CAP: 0.4 | 30 days supply | Qty: 30 | Fill #1

## 2019-06-22 MED FILL — LISINOPRIL-HCTZ 20-25 MG TA: 20-25 | 30 days supply | Qty: 30 | Fill #2

## 2019-07-07 ENCOUNTER — Ambulatory Visit: Payer: Self-pay | Admitting: Family Medicine

## 2019-07-08 ENCOUNTER — Other Ambulatory Visit: Payer: Self-pay

## 2019-07-08 ENCOUNTER — Ambulatory Visit: Payer: Self-pay | Attending: Family Medicine

## 2019-07-08 MED FILL — TAMSULOSIN HCL 0.4 MG CAP: 0.4 | 30 days supply | Qty: 30 | Fill #2

## 2019-07-20 ENCOUNTER — Other Ambulatory Visit: Payer: Self-pay

## 2019-07-20 ENCOUNTER — Encounter: Payer: Self-pay | Admitting: Nurse Practitioner

## 2019-07-20 ENCOUNTER — Ambulatory Visit (INDEPENDENT_AMBULATORY_CARE_PROVIDER_SITE_OTHER): Payer: Self-pay | Admitting: Nurse Practitioner

## 2019-07-20 VITALS — BP 138/94 | HR 98 | Ht 70.0 in | Wt 186.2 lb

## 2019-07-20 DIAGNOSIS — R202 Paresthesia of skin: Secondary | ICD-10-CM

## 2019-07-20 DIAGNOSIS — I10 Essential (primary) hypertension: Secondary | ICD-10-CM

## 2019-07-20 LAB — POCT URINALYSIS DIPSTICK
Bilirubin, UA: NEGATIVE
Blood, UA: NEGATIVE
Glucose, UA: NEGATIVE
Ketones, UA: NEGATIVE
Leukocytes, UA: NEGATIVE
Nitrite, UA: NEGATIVE
Protein, UA: NEGATIVE
Spec Grav, UA: 1.025 (ref 1.010–1.025)
Urobilinogen, UA: 1 E.U./dL
pH, UA: 7.5 (ref 5.0–8.0)

## 2019-07-20 LAB — POCT CBG (FASTING - GLUCOSE)-MANUAL ENTRY: Glucose Fasting, POC: 95 mg/dL (ref 70–99)

## 2019-07-20 NOTE — Patient Instructions (Signed)
Paresthesia Paresthesia is a burning or prickling feeling. This feeling can happen in any part of the body. It often happens in the hands, arms, legs, or feet. Usually, it is not painful. In most cases, the feeling goes away in a short time and is not a sign of a serious problem. If you have paresthesia that lasts a long time, you may need to be seen by your doctor. Follow these instructions at home: Alcohol use   Do not drink alcohol if: ? Your doctor tells you not to drink. ? You are pregnant, may be pregnant, or are planning to become pregnant.  If you drink alcohol: ? Limit how much you use to:  0-1 drink a day for women.  0-2 drinks a day for men. ? Be aware of how much alcohol is in your drink. In the [place] one drink equals one 12 oz bottle of beer (355 mL), one 5 oz glass of wine (148 mL), or one 1 oz glass of hard liquor (44 mL). Nutrition   Eat a healthy diet. This includes: ? Eating foods that have a lot of fiber in them, such as fresh fruits and vegetables, whole grains, and beans. ? Limiting foods that have a lot of fat and processed sugars in them, such as fried or sweet foods. General instructions  Take over-the-counter and prescription medicines only as told by your doctor.  Do not use any products that have nicotine or tobacco in them, such as cigarettes and e-cigarettes. If you need help quitting, ask your doctor.  If you have diabetes, work with your doctor to make sure your blood sugar stays in a healthy range.  If your feet feel numb: ? Check for redness, warmth, and swelling every day. ? Wear padded socks and comfortable shoes. These help protect your feet.  Keep all follow-up visits as told by your doctor. This is important. Contact a doctor if:  You have paresthesia that gets worse or does not go away.  Your burning or prickling feeling gets worse when you walk.  You have pain or cramps.  You feel dizzy.  You have a rash. Get help right away if  you:  Feel weak.  Have trouble walking or moving.  Have problems speaking, understanding, or seeing.  Feel confused.  Cannot control when you pee (urinate) or poop (have a bowel movement).  Lose feeling (have numbness) after an injury.  Have new weakness in an arm or leg.  Pass out (faint). Summary  Paresthesia is a burning or prickling feeling. It often happens in the hands, arms, legs, or feet.  In most cases, the feeling goes away in a short time and is not a sign of a serious problem.  If you have paresthesia that lasts a long time, you may need to be seen by your doctor. This information is not intended to replace advice given to you by your health care provider. Make sure you discuss any questions you have with your health care provider. Document Revised: 03/31/2018 Document Reviewed: 03/14/2017 Elsevier Patient Education  2020 Elsevier Inc.  

## 2019-07-20 NOTE — Progress Notes (Signed)
[place], [place]  [address] Phone:  [phone]   Fax:  [phone]   Established Patient Office Visit  Subjective:  Patient ID: Kevin Little, male    DOB: Dec 06, 1970  Age: 49 y.o. MRN: [id number]  CC:  Chief Complaint  Patient presents with  . Follow-up    having aching legs, feet , tingling ,burning , numbness, finger feel numbness at time     HPI Kevin Little presents for an acute visit. He  has a past medical history of Herpes, Herpes simplex, Hypertension, and Sinus problem.  He presents for evaluation of right finger pain. Onset was sudden, not related to any specific activity.He was at work and felt like he was going to faint. The pain is mild, worsens with movement and rest, and has been ongoing. There is associated numbness, tingling, and burning. Evaluation to date: CT Head without contrast. Treatment to date: nothing specific. He is also have numbness and tingling in his toes greater on the right than the left. He denies past neck or back injuries. He admits that he has lost about 7 pounds because he has been fasting and praying Mon-Thurs. He will continue to this until June.     Past Medical History:  Diagnosis Date  . Herpes   . Herpes simplex   . Hypertension   . Sinus problem     No past surgical history on file.  Family History  Problem Relation Age of Onset  . Hypertension Mother   . Cancer Father        prostate    Social History   Socioeconomic History  . Marital status: Married    Spouse name: Not on file  . Number of children: Not on file  . Years of education: Not on file  . Highest education level: Not on file  Occupational History  . Not on file  Tobacco Use  . Smoking status: Never Smoker  . Smokeless tobacco: Never Used  Substance and Sexual Activity  . Alcohol use: No  . Drug use: No  . Sexual activity: Yes    Birth control/protection: None  Other Topics Concern  . Not on file    Social History Narrative   ** Merged History Encounter **       Social Determinants of Health   Financial Resource Strain:   . Difficulty of Paying Living Expenses:   Food Insecurity:   . Worried About Charity fundraiser in the Last Year:   . Arboriculturist in the Last Year:   Transportation Needs:   . Film/video editor (Medical):   Marland Kitchen Lack of Transportation (Non-Medical):   Physical Activity:   . Days of Exercise per Week:   . Minutes of Exercise per Session:   Stress:   . Feeling of Stress :   Social Connections:   . Frequency of Communication with Friends and Family:   . Frequency of Social Gatherings with Friends and Family:   . Attends Religious Services:   . Active Member of Clubs or Organizations:   . Attends Archivist Meetings:   Marland Kitchen Marital Status:   Intimate Partner Violence:   . Fear of Current or Ex-Partner:   . Emotionally Abused:   Marland Kitchen Physically Abused:   . Sexually Abused:     Outpatient Medications Prior to Visit  Medication Sig Dispense Refill  . aspirin EC 81 MG tablet Take 1 tablet (81 mg total)  by mouth daily. 30 tablet 11  . lisinopril-hydrochlorothiazide (ZESTORETIC) 20-25 MG tablet Take 1 tablet by mouth daily. 90 tablet 1  . tamsulosin (FLOMAX) 0.4 MG CAPS capsule TAKE 2 CAPSULES (0.8 MG TOTAL) BY MOUTH DAILY. 60 capsule 1  . metoprolol tartrate (LOPRESSOR) 100 MG tablet TAKE 1 TABLET 2 HR PRIOR TO CARDIAC PROCEDURE (Patient not taking: Reported on 04/19/2019) 1 tablet 0   No facility-administered medications prior to visit.    Allergies  Allergen Reactions  . Omeprazole Rash  . Rabeprazole Sodium Rash    ( Aciphex)    ROS Review of Systems    Objective:    Physical Exam  Constitutional: He is oriented to person, place, and time. He appears well-developed and well-nourished.  HENT:  Head: Normocephalic.  Eyes: Pupils are equal, round, and reactive to light.  Cardiovascular: Normal rate, regular rhythm, normal heart  sounds and intact distal pulses.  Pulmonary/Chest: Effort normal and breath sounds normal.  Musculoskeletal:        General: Normal range of motion.     Right hand: Normal.     Left hand: Normal.     Cervical back: Normal range of motion.     Right ankle: Normal.     Left ankle: Normal.     Right foot: Normal.     Left foot: Normal.  Neurological: He is alert and oriented to person, place, and time.  Skin: Skin is warm and dry.  Psychiatric: He has a normal mood and affect. His behavior is normal. Judgment and thought content normal.       BP (!) 138/94 (BP Location: Left Arm, Patient Position: Sitting)   Pulse 98   Ht 5\' 10"  (1.778 m)   Wt 186 lb 3.2 oz (84.5 kg)   SpO2 98%   BMI 26.72 kg/m  Wt Readings from Last 3 Encounters:  07/20/19 186 lb 3.2 oz (84.5 kg)  04/21/19 193 lb (87.5 kg)  04/08/19 192 lb (87.1 kg)     There are no preventive care reminders to display for this patient.  There are no preventive care reminders to display for this patient.  Lab Results  Component Value Date   TSH 1.010 05/07/2018   Lab Results  Component Value Date   WBC 3.5 (L) 04/19/2019   HGB 15.0 04/19/2019   HCT 45.5 04/19/2019   MCV 89.9 04/19/2019   PLT 183 04/19/2019   Lab Results  Component Value Date   NA 138 04/19/2019   K 3.5 04/19/2019   CO2 29 04/19/2019   GLUCOSE 112 (H) 04/19/2019   BUN 9 04/19/2019   CREATININE 1.10 04/19/2019   BILITOT 0.7 05/07/2018   ALKPHOS 77 05/07/2018   AST 20 05/07/2018   ALT 25 05/07/2018   PROT 7.6 05/07/2018   ALBUMIN 4.2 05/07/2018   CALCIUM 9.5 04/19/2019   ANIONGAP 9 04/19/2019   Lab Results  Component Value Date   CHOL 187 04/21/2019   Lab Results  Component Value Date   HDL 56 04/21/2019   Lab Results  Component Value Date   LDLCALC 110 (H) 04/21/2019   Lab Results  Component Value Date   TRIG 120 04/21/2019   Lab Results  Component Value Date   CHOLHDL 3.3 04/21/2019   Lab Results  Component Value Date    HGBA1C 5.5 04/30/2018      Assessment & Plan:   Problem List Items Addressed This Visit      Unprioritized   Essential hypertension - Primary  Relevant Orders   POCT Urinalysis Dipstick (Completed)   Comp. Metabolic Panel (12)    Other Visit Diagnoses    Hand paresthesia, unspecified laterality       digits 1 and 2   Relevant Orders   NCV with EMG(electromyography)   Magnesium   Vitamin B12   Glucose (CBG), Fasting (Completed)   Paresthesia of both feet       Relevant Orders   NCV with EMG(electromyography)   Magnesium   Vitamin B12      No orders of the defined types were placed in this encounter.   Follow-up: Return for Appointment As Scheduled.    Barbette Merino, NP

## 2019-07-21 LAB — COMP. METABOLIC PANEL (12)
AST: 29 IU/L (ref 0–40)
Albumin/Globulin Ratio: 1.3 (ref 1.2–2.2)
Albumin: 4 g/dL (ref 4.0–5.0)
Alkaline Phosphatase: 84 IU/L (ref 39–117)
BUN/Creatinine Ratio: 8 — ABNORMAL LOW (ref 9–20)
BUN: 8 mg/dL (ref 6–24)
Bilirubin Total: 0.9 mg/dL (ref 0.0–1.2)
Calcium: 9.3 mg/dL (ref 8.7–10.2)
Chloride: 104 mmol/L (ref 96–106)
Creatinine, Ser: 0.99 mg/dL (ref 0.76–1.27)
GFR calc Af Amer: 103 mL/min/{1.73_m2} (ref >59)
GFR calc non Af Amer: 89 mL/min/{1.73_m2} (ref >59)
Globulin, Total: 3.2 g/dL (ref 1.5–4.5)
Glucose: 95 mg/dL (ref 65–99)
Potassium: 4 mmol/L (ref 3.5–5.2)
Sodium: 142 mmol/L (ref 134–144)
Total Protein: 7.2 g/dL (ref 6.0–8.5)

## 2019-07-21 LAB — MAGNESIUM: Magnesium: 2.2 mg/dL (ref 1.6–2.3)

## 2019-07-21 LAB — VITAMIN B12: Vitamin B-12: 565 pg/mL (ref 232–1245)

## 2019-08-04 ENCOUNTER — Other Ambulatory Visit: Payer: Self-pay | Admitting: Family Medicine

## 2019-08-04 DIAGNOSIS — R300 Dysuria: Secondary | ICD-10-CM

## 2019-08-04 MED FILL — TAMSULOSIN HCL 0.4 MG CAP: 0.4 | 30 days supply | Qty: 30 | Fill #0

## 2019-08-04 MED FILL — LISINOPRIL-HYDROCHLOROTHIAZ: 20-25 | 30 days supply | Qty: 30 | Fill #3

## 2019-09-22 MED FILL — LISINOPRIL-HYDROCHLOROTHIAZ: 20-25 | 30 days supply | Qty: 30 | Fill #4

## 2019-09-22 MED FILL — TAMSULOSIN HCL 0.4 MG CAP: 0.4 | 30 days supply | Qty: 30 | Fill #2

## 2019-10-14 MED FILL — TAMSULOSIN HCL 0.4 MG CAP: 0.4 | 30 days supply | Qty: 30 | Fill #3

## 2019-10-20 ENCOUNTER — Ambulatory Visit (INDEPENDENT_AMBULATORY_CARE_PROVIDER_SITE_OTHER): Payer: Self-pay | Admitting: Family Medicine

## 2019-10-20 ENCOUNTER — Other Ambulatory Visit: Payer: Self-pay

## 2019-10-20 VITALS — BP 144/86 | HR 69 | Temp 98.4°F | Resp 14 | Ht 70.0 in | Wt 197.0 lb

## 2019-10-20 DIAGNOSIS — R339 Retention of urine, unspecified: Secondary | ICD-10-CM

## 2019-10-20 DIAGNOSIS — I10 Essential (primary) hypertension: Secondary | ICD-10-CM

## 2019-10-20 DIAGNOSIS — R202 Paresthesia of skin: Secondary | ICD-10-CM

## 2019-10-20 NOTE — Progress Notes (Signed)
[facility]   Established Patient Office Visit  Subjective:  Patient ID: Kevin Little, male    DOB: [birth year]  Age: 49 y.o. MRN: [id number]  CC:  Chief Complaint  Patient presents with  . Hypertension    HPI Kevin Little is a 49 year old male with a medical history significant for essential hypertension, chronic urinary retention, and paresthesias to both hands presents for follow-up of chronic conditions. Patient has been complaining of paresthesias.  Hands bilaterally.  Problem has been present for greater than 6 months.  He characterizes paresthesias as periodic numbness and tingling.  Problem occurs on most days.  He does not have any difficulty with activities of daily living or functioning.  Patient was seen on 07/20/2018 problem.  He was scheduled to undergo NCV with electromyography to further assess this problem he has yet to follow-up for testing.  Patient also has a history of essential hypertension.  Blood pressure has been well controlled on current medication regimen.  He does not check blood pressure follows a low-sodium, low-fat diet divided over small meals throughout the day.  He does not exercise consistently.  He states that he is very physical with his current job.  He says that he does a lot of walking.  He denies any bilateral lower extremity swelling, chest pain, shortness of breath, orthopnea, or persistent headache.  Patient has heart disease and stroke. Past Medical History:  Diagnosis Date  . Herpes   . Herpes simplex   . Hypertension   . Sinus problem     No past surgical history on file.  Family History  Problem Relation Age of Onset  . Hypertension Mother   . Cancer Father        prostate    Social History   Socioeconomic History  . Marital status: Married    Spouse name: Not on file  . Number of children: Not on file  . Years of education: Not on file  . Highest education level: Not on file    Occupational History  . Not on file  Tobacco Use  . Smoking status: Never Smoker  . Smokeless tobacco: Never Used  Vaping Use  . Vaping Use: Never used  Substance and Sexual Activity  . Alcohol use: No  . Drug use: No  . Sexual activity: Yes    Birth control/protection: None  Other Topics Concern  . Not on file  Social History Narrative   ** Merged History Encounter **       Social Determinants of Health   Financial Resource Strain:   . Difficulty of Paying Living Expenses:   Food Insecurity:   . Worried About Programme researcher, broadcasting/film/video in the Last Year:   . Barista in the Last Year:   Transportation Needs:   . Freight forwarder (Medical):   Marland Kitchen Lack of Transportation (Non-Medical):   Physical Activity:   . Days of Exercise per Week:   . Minutes of Exercise per Session:   Stress:   . Feeling of Stress :   Social Connections:   . Frequency of Communication with Friends and Family:   . Frequency of Social Gatherings with Friends and Family:   . Attends Religious Services:   . Active Member of Clubs or Organizations:   . Attends Banker Meetings:   Marland Kitchen Marital Status:   Intimate Partner Violence:   . Fear of Current or Ex-Partner:   . Emotionally Abused:   .  Physically Abused:   . Sexually Abused:     Outpatient Medications Prior to Visit  Medication Sig Dispense Refill  . aspirin EC 81 MG tablet Take 1 tablet (81 mg total) by mouth daily. 30 tablet 11  . lisinopril-hydrochlorothiazide (ZESTORETIC) 20-25 MG tablet Take 1 tablet by mouth daily. 90 tablet 1  . metoprolol tartrate (LOPRESSOR) 100 MG tablet TAKE 1 TABLET 2 HR PRIOR TO CARDIAC PROCEDURE (Patient not taking: Reported on 04/19/2019) 1 tablet 0  . tamsulosin (FLOMAX) 0.4 MG CAPS capsule TAKE 1 CAPSULE (0.4 MG TOTAL) BY MOUTH DAILY. 30 capsule 3   No facility-administered medications prior to visit.    Allergies  Allergen Reactions  . Omeprazole Rash  . Rabeprazole Sodium Rash    (  Aciphex)    ROS Review of Systems    Objective:    Physical Exam  BP (!) 144/86 (BP Location: Left Arm, Patient Position: Sitting, Cuff Size: Large) Comment: manually  Pulse 69   Temp 98.4 F (36.9 C) (Oral)   Resp 14   Ht 5\' 10"  (1.778 m)   Wt 197 lb (89.4 kg)   SpO2 100%   BMI 28.27 kg/m  Wt Readings from Last 3 Encounters:  10/20/19 197 lb (89.4 kg)  07/20/19 186 lb 3.2 oz (84.5 kg)  04/21/19 193 lb (87.5 kg)     Health Maintenance Due  Topic Date Due  . Hepatitis C Screening  Never done    There are no preventive care reminders to display for this patient.  Lab Results  Component Value Date   TSH 1.010 05/07/2018   Lab Results  Component Value Date   WBC 3.5 (L) 04/19/2019   HGB 15.0 04/19/2019   HCT 45.5 04/19/2019   MCV 89.9 04/19/2019   PLT 183 04/19/2019   Lab Results  Component Value Date   NA 142 07/20/2019   K 4.0 07/20/2019   CO2 29 04/19/2019   GLUCOSE 95 07/20/2019   BUN 8 07/20/2019   CREATININE 0.99 07/20/2019   BILITOT 0.9 07/20/2019   ALKPHOS 84 07/20/2019   AST 29 07/20/2019   ALT 25 05/07/2018   PROT 7.2 07/20/2019   ALBUMIN 4.0 07/20/2019   CALCIUM 9.3 07/20/2019   ANIONGAP 9 04/19/2019   Lab Results  Component Value Date   CHOL 187 04/21/2019   Lab Results  Component Value Date   HDL 56 04/21/2019   Lab Results  Component Value Date   LDLCALC 110 (H) 04/21/2019   Lab Results  Component Value Date   TRIG 120 04/21/2019   Lab Results  Component Value Date   CHOLHDL 3.3 04/21/2019   Lab Results  Component Value Date   HGBA1C 5.5 04/30/2018      Assessment & Plan:   Problem List Items Addressed This Visit      Cardiovascular and Mediastinum   Essential hypertension - Primary    Other Visit Diagnoses    Hand paresthesia, unspecified laterality       Urinary retention         1. Essential hypertension BP (!) 144/86 (BP Location: Left Arm, Patient Position: Sitting, Cuff Size: Large) Comment:  manually  Pulse 69   Temp 98.4 F (36.9 C) (Oral)   Resp 14   Ht 5\' 10"  (1.778 m)   Wt 197 lb (89.4 kg)   SpO2 100%   BMI 28.27 kg/m   - Continue medication, monitor blood pressure at home. Continue DASH diet. Reminder to go to the ER  if any CP, SOB, nausea, dizziness, severe HA, changes vision/speech, left arm numbness and tingling and jaw pain.     2. Hand paresthesia, unspecified laterality Reschedule electromyography, will follow up after further review of test results.   3. Urinary retention No medication changes, complete flomax.     Follow-up: Return in about 6 months (around 04/21/2020).     Nolon Nations  APRN, MSN, FNP-C [facility] [address] [place], [place] [address] [phone]

## 2019-11-04 ENCOUNTER — Encounter: Payer: Self-pay | Admitting: Family Medicine

## 2019-11-13 ENCOUNTER — Other Ambulatory Visit: Payer: Self-pay | Admitting: Family Medicine

## 2019-11-13 DIAGNOSIS — R300 Dysuria: Secondary | ICD-10-CM

## 2019-11-13 MED FILL — LISINOPRIL-HYDROCHLOROTHIAZ: 20-25 | 30 days supply | Qty: 30 | Fill #5

## 2019-11-16 ENCOUNTER — Telehealth: Payer: Self-pay | Admitting: Family Medicine

## 2019-11-17 ENCOUNTER — Other Ambulatory Visit (HOSPITAL_COMMUNITY): Payer: Self-pay | Admitting: Family Medicine

## 2019-11-17 ENCOUNTER — Other Ambulatory Visit: Payer: Self-pay

## 2019-11-17 DIAGNOSIS — R300 Dysuria: Secondary | ICD-10-CM

## 2019-11-17 MED ORDER — TAMSULOSIN HCL 0.4 MG PO CAPS: ORAL_CAPSULE | ORAL | 3 refills | Status: DC

## 2019-11-17 MED FILL — TAMSULOSIN HCL 0.4 MG CAP: 0.4 | 30 days supply | Qty: 30 | Fill #0

## 2019-11-17 NOTE — Progress Notes (Unsigned)
Meds ordered this encounter  Medications  . tamsulosin (FLOMAX) 0.4 MG CAPS capsule    Sig: TAKE 1 CAPSULE (0.4 MG TOTAL) BY MOUTH DAILY.    Dispense:  30 capsule    Refill:  3    Order Specific Question:   Supervising Provider    Answer:   Quentin Angst [id number]

## 2019-11-17 NOTE — Telephone Encounter (Signed)
[facility]One

## 2019-12-15 MED FILL — TAMSULOSIN HCL 0.4 MG CAP: 0.4 | 30 days supply | Qty: 30 | Fill #1

## 2019-12-24 ENCOUNTER — Other Ambulatory Visit: Payer: Self-pay

## 2019-12-24 ENCOUNTER — Other Ambulatory Visit: Payer: Self-pay | Admitting: Family Medicine

## 2019-12-24 DIAGNOSIS — I10 Essential (primary) hypertension: Secondary | ICD-10-CM

## 2019-12-24 MED ORDER — LISINOPRIL-HYDROCHLOROTHIAZIDE 20-25 MG PO TABS: 1.0000 | ORAL_TABLET | Freq: Every day | ORAL | 1 refills | Status: DC

## 2019-12-24 MED FILL — LISINOPRIL-HYDROCHLOROTHIAZ: 20-25 | 30 days supply | Qty: 30 | Fill #0

## 2019-12-29 ENCOUNTER — Telehealth: Payer: Self-pay | Admitting: Family Medicine

## 2019-12-29 NOTE — Telephone Encounter (Signed)
Copied from CRM [id number]. Topic: Appointment Scheduling - Scheduling Inquiry for Clinic >> Dec 28, 2019  9:59 AM Crist Infante wrote: Reason for CRM:  pt needs appt with Mikle Bosworth for the orange card.  Please call back

## 2019-12-29 NOTE — Telephone Encounter (Signed)
I return Pt call and told him that the program exp 01/07/20 and the next appt he has with the PCP or specialist is for next year, I explain to him that [facility] wont review the application if is more than 30 day from the time he see the financial consoler at [facility]

## 2020-01-08 MED FILL — TAMSULOSIN HCL 0.4 MG CAP: 0.4 | 30 days supply | Qty: 30 | Fill #2

## 2020-01-11 ENCOUNTER — Other Ambulatory Visit: Payer: Self-pay | Admitting: Nurse Practitioner

## 2020-01-11 ENCOUNTER — Telehealth: Payer: Self-pay

## 2020-01-11 ENCOUNTER — Telehealth: Payer: Self-pay | Admitting: Family Medicine

## 2020-01-11 DIAGNOSIS — R300 Dysuria: Secondary | ICD-10-CM

## 2020-01-11 MED ORDER — TAMSULOSIN HCL 0.4 MG PO CAPS: 0.4000 mg | ORAL_CAPSULE | Freq: Every day | ORAL | 3 refills | Status: DC

## 2020-01-11 NOTE — Telephone Encounter (Signed)
Copied from CRM [id number]. Topic: General - Inquiry >> Jan 11, 2020  4:25 PM Adrian Prince D wrote: Reason for CRM: Patient would like a return call from [facility]. He can be reached at [phone]. Please advise

## 2020-01-12 ENCOUNTER — Telehealth: Payer: Self-pay | Admitting: Family Medicine

## 2020-01-12 NOTE — Telephone Encounter (Signed)
Done

## 2020-01-12 NOTE — Telephone Encounter (Signed)
I call back the PT, LVM to call back

## 2020-01-29 MED FILL — TAMSULOSIN HCL 0.4 MG CAP: 0.4 | 30 days supply | Qty: 30 | Fill #3

## 2020-01-29 MED FILL — LISINOPRIL-HYDROCHLOROTHIAZ: 20-25 | 30 days supply | Qty: 30 | Fill #1

## 2020-02-22 MED FILL — TAMSULOSIN HCL 0.4 MG CAP: 0.4 | 30 days supply | Qty: 30 | Fill #0

## 2020-03-07 ENCOUNTER — Other Ambulatory Visit: Payer: Self-pay

## 2020-03-07 ENCOUNTER — Ambulatory Visit: Payer: Self-pay | Attending: Family Medicine

## 2020-03-17 MED FILL — TAMSULOSIN HCL 0.4 MG CAP: 0.4 | 30 days supply | Qty: 30 | Fill #1

## 2020-03-17 MED FILL — LISINOPRIL-HYDROCHLOROTHIAZ: 20-25 | 30 days supply | Qty: 30 | Fill #2

## 2020-04-08 ENCOUNTER — Ambulatory Visit: Payer: Self-pay | Admitting: Urology

## 2020-04-12 MED FILL — TAMSULOSIN HCL 0.4 MG CAP: 0.4 | 30 days supply | Qty: 30 | Fill #2

## 2020-04-26 ENCOUNTER — Ambulatory Visit: Payer: Self-pay | Admitting: Family Medicine

## 2020-05-05 MED FILL — LISINOPRIL-HYDROCHLOROTHIAZ: 20-25 | 30 days supply | Qty: 30 | Fill #3

## 2020-05-09 ENCOUNTER — Encounter: Payer: Self-pay | Admitting: Urology

## 2020-05-09 ENCOUNTER — Ambulatory Visit: Payer: Self-pay | Admitting: Urology

## 2020-05-09 MED FILL — TAMSULOSIN HCL 0.4 MG CAP: 0.4 | 30 days supply | Qty: 30 | Fill #3

## 2020-06-14 MED FILL — LISINOPRIL-HYDROCHLOROTHIAZ: 20-25 | 30 days supply | Qty: 30 | Fill #4

## 2020-06-14 MED FILL — TAMSULOSIN HCL 0.4 MG CAP: 0.4 | 30 days supply | Qty: 30 | Fill #4

## 2020-06-18 ENCOUNTER — Other Ambulatory Visit: Payer: Self-pay

## 2020-08-02 ENCOUNTER — Other Ambulatory Visit: Payer: Self-pay

## 2020-08-02 MED FILL — Lisinopril & Hydrochlorothiazide Tab 20-25 MG: ORAL | 30 days supply | Qty: 30 | Fill #0 | Status: AC

## 2020-08-02 MED FILL — Tamsulosin HCl Cap 0.4 MG: ORAL | 30 days supply | Qty: 30 | Fill #0 | Status: AC

## 2020-08-08 ENCOUNTER — Other Ambulatory Visit: Payer: Self-pay

## 2020-09-21 ENCOUNTER — Other Ambulatory Visit: Payer: Self-pay | Admitting: Family Medicine

## 2020-09-21 ENCOUNTER — Other Ambulatory Visit: Payer: Self-pay

## 2020-09-21 DIAGNOSIS — I10 Essential (primary) hypertension: Secondary | ICD-10-CM

## 2020-09-21 MED ORDER — LISINOPRIL-HYDROCHLOROTHIAZIDE 20-25 MG PO TABS
1.0000 | ORAL_TABLET | Freq: Every day | ORAL | 1 refills | Status: DC
  Filled 2020-09-21: qty 30, 30d supply, fill #0
  Filled 2020-11-03: qty 30, 30d supply, fill #1

## 2020-09-21 MED FILL — Tamsulosin HCl Cap 0.4 MG: ORAL | 30 days supply | Qty: 30 | Fill #1 | Status: AC

## 2020-09-22 ENCOUNTER — Other Ambulatory Visit: Payer: Self-pay

## 2020-10-26 ENCOUNTER — Other Ambulatory Visit: Payer: Self-pay

## 2020-10-26 MED FILL — Tamsulosin HCl Cap 0.4 MG: ORAL | 30 days supply | Qty: 30 | Fill #2 | Status: AC

## 2020-11-03 ENCOUNTER — Other Ambulatory Visit: Payer: Self-pay

## 2020-12-08 ENCOUNTER — Other Ambulatory Visit: Payer: Self-pay

## 2020-12-08 MED FILL — Tamsulosin HCl Cap 0.4 MG: ORAL | 30 days supply | Qty: 30 | Fill #3 | Status: AC

## 2020-12-26 ENCOUNTER — Other Ambulatory Visit: Payer: Self-pay | Admitting: Family Medicine

## 2020-12-26 DIAGNOSIS — I10 Essential (primary) hypertension: Secondary | ICD-10-CM

## 2020-12-27 ENCOUNTER — Other Ambulatory Visit: Payer: Self-pay

## 2020-12-29 ENCOUNTER — Other Ambulatory Visit: Payer: Self-pay

## 2020-12-29 ENCOUNTER — Other Ambulatory Visit: Payer: Self-pay | Admitting: Family Medicine

## 2020-12-29 ENCOUNTER — Telehealth: Payer: Self-pay

## 2020-12-29 DIAGNOSIS — I10 Essential (primary) hypertension: Secondary | ICD-10-CM

## 2020-12-29 MED ORDER — LISINOPRIL-HYDROCHLOROTHIAZIDE 20-25 MG PO TABS
1.0000 | ORAL_TABLET | Freq: Every day | ORAL | 5 refills | Status: DC
  Filled 2020-12-29: qty 30, 30d supply, fill #0
  Filled 2021-01-31: qty 30, 30d supply, fill #1
  Filled 2021-03-13: qty 30, 30d supply, fill #2
  Filled 2021-04-19: qty 30, 30d supply, fill #3
  Filled 2021-04-19: qty 30, 30d supply, fill #0
  Filled 2021-05-25: qty 30, 30d supply, fill #1
  Filled 2021-06-29: qty 30, 30d supply, fill #2

## 2020-12-29 NOTE — Progress Notes (Signed)
Current Outpatient Medications on File Prior to Visit  Medication Sig Dispense Refill   aspirin EC 81 MG tablet Take 1 tablet (81 mg total) by mouth daily. 30 tablet 11   metoprolol tartrate (LOPRESSOR) 100 MG tablet TAKE 1 TABLET 2 HR PRIOR TO CARDIAC PROCEDURE (Patient not taking: Reported on 04/19/2019) 1 tablet 0   tamsulosin (FLOMAX) 0.4 MG CAPS capsule TAKE 1 CAPSULE (0.4 MG TOTAL) BY MOUTH DAILY. 90 capsule 3   No current facility-administered medications on file prior to visit.    Meds ordered this encounter  Medications   lisinopril-hydrochlorothiazide (ZESTORETIC) 20-25 MG tablet    Sig: Take 1 tablet by mouth daily.    Dispense:  30 tablet    Refill:  5    Please have patient contact office for an appointment.    Order Specific Question:   Supervising Provider    Answer:   Quentin Angst [[id number]]

## 2020-12-29 NOTE — Telephone Encounter (Signed)
Pt asking to refill  Lisinopril-hydrochlorothiazide  Pt just made a f/u appt

## 2020-12-30 ENCOUNTER — Other Ambulatory Visit: Payer: Self-pay

## 2021-01-15 ENCOUNTER — Other Ambulatory Visit: Payer: Self-pay | Admitting: Nurse Practitioner

## 2021-01-15 DIAGNOSIS — R300 Dysuria: Secondary | ICD-10-CM

## 2021-01-17 ENCOUNTER — Other Ambulatory Visit: Payer: Self-pay

## 2021-01-17 MED ORDER — TAMSULOSIN HCL 0.4 MG PO CAPS
ORAL_CAPSULE | Freq: Every day | ORAL | 3 refills | Status: DC
  Filled 2021-01-17: qty 30, 30d supply, fill #0
  Filled 2021-02-20: qty 30, 30d supply, fill #1
  Filled 2021-03-28: qty 30, 30d supply, fill #0
  Filled 2021-03-28: qty 30, 30d supply, fill #2
  Filled 2021-05-05: qty 30, 30d supply, fill #1
  Filled 2021-06-13: qty 30, 30d supply, fill #2
  Filled 2021-07-25: qty 30, 30d supply, fill #3
  Filled 2021-09-04: qty 30, 30d supply, fill #4
  Filled 2021-10-09: qty 30, 30d supply, fill #5
  Filled 2021-11-14 (×3): qty 30, 30d supply, fill #6
  Filled 2021-12-22: qty 30, 30d supply, fill #7

## 2021-01-24 ENCOUNTER — Ambulatory Visit: Payer: Self-pay | Admitting: Family Medicine

## 2021-01-31 ENCOUNTER — Other Ambulatory Visit: Payer: Self-pay

## 2021-02-01 ENCOUNTER — Other Ambulatory Visit: Payer: Self-pay

## 2021-02-03 ENCOUNTER — Other Ambulatory Visit: Payer: Self-pay

## 2021-02-14 ENCOUNTER — Ambulatory Visit (INDEPENDENT_AMBULATORY_CARE_PROVIDER_SITE_OTHER): Payer: Self-pay | Admitting: Family Medicine

## 2021-02-14 ENCOUNTER — Other Ambulatory Visit: Payer: Self-pay

## 2021-02-14 ENCOUNTER — Encounter: Payer: Self-pay | Admitting: Family Medicine

## 2021-02-14 VITALS — BP 148/99 | HR 76 | Temp 98.2°F | Ht 70.0 in | Wt 206.5 lb

## 2021-02-14 DIAGNOSIS — I10 Essential (primary) hypertension: Secondary | ICD-10-CM

## 2021-02-14 DIAGNOSIS — E7849 Other hyperlipidemia: Secondary | ICD-10-CM

## 2021-02-14 DIAGNOSIS — Z131 Encounter for screening for diabetes mellitus: Secondary | ICD-10-CM

## 2021-02-14 NOTE — Progress Notes (Signed)
[facility]   Established Patient Office Visit  Subjective:  Patient ID: Kevin Little, male    DOB: [birth year]  Age: 50 y.o. MRN: [id number]  CC:  Chief Complaint  Patient presents with   Follow-up    FU visit about his blood pressure.    HPI Kevin Little is a very pleasant 50 year old male with a medical history significant for essential hypertension and hyperlipidemia that presents for follow-up of chronic conditions.  Patient states that he is doing very well and is without complaint.  He did not take blood pressure medications prior to arrival.  He does not check blood pressures at home.  He says that he has not been exercising or following a low-fat diet over the past 6 months.  He states that he has been busy at work and stays very active.  He denies any chest pain, shortness of breath, orthopnea, urinary symptoms, nausea, vomiting, or diarrhea today.  Past Medical History:  Diagnosis Date   Herpes    Herpes simplex    Hypertension    Sinus problem     History reviewed. No pertinent surgical history.  Family History  Problem Relation Age of Onset   Hypertension Mother    Cancer Father        prostate    Social History   Socioeconomic History   Marital status: Married    Spouse name: Not on file   Number of children: Not on file   Years of education: Not on file   Highest education level: Not on file  Occupational History   Not on file  Tobacco Use   Smoking status: Never   Smokeless tobacco: Never  Vaping Use   Vaping Use: Never used  Substance and Sexual Activity   Alcohol use: No   Drug use: No   Sexual activity: Yes    Birth control/protection: None  Other Topics Concern   Not on file  Social History Narrative   ** Merged History Encounter **       Social Determinants of Health   Financial Resource Strain: Not on file  Food Insecurity: Not on file  Transportation Needs: Not on file   Physical Activity: Not on file  Stress: Not on file  Social Connections: Not on file  Intimate Partner Violence: Not on file    Outpatient Medications Prior to Visit  Medication Sig Dispense Refill   aspirin EC 81 MG tablet Take 1 tablet (81 mg total) by mouth daily. 30 tablet 11   lisinopril-hydrochlorothiazide (ZESTORETIC) 20-25 MG tablet Take 1 tablet by mouth daily. 30 tablet 5   metoprolol tartrate (LOPRESSOR) 100 MG tablet TAKE 1 TABLET 2 HR PRIOR TO CARDIAC PROCEDURE 1 tablet 0   tamsulosin (FLOMAX) 0.4 MG CAPS capsule TAKE 1 CAPSULE (0.4 MG TOTAL) BY MOUTH DAILY. 90 capsule 3   No facility-administered medications prior to visit.    Allergies  Allergen Reactions   Omeprazole Rash   Rabeprazole Sodium Rash    ( Aciphex)    ROS Review of Systems  Constitutional: Negative.   HENT: Negative.    Eyes: Negative.   Respiratory: Negative.    Cardiovascular: Negative.   Endocrine: Negative.  Negative for polydipsia, polyphagia and polyuria.  Genitourinary: Negative.   Musculoskeletal: Negative.   Skin: Negative.   Allergic/Immunologic: Negative.   Neurological: Negative.   Hematological: Negative.      Objective:    Physical Exam Constitutional:  Appearance: Normal appearance. He is obese.  Eyes:     Pupils: Pupils are equal, round, and reactive to light.  Cardiovascular:     Rate and Rhythm: Normal rate and regular rhythm.     Pulses: Normal pulses.  Abdominal:     General: Bowel sounds are normal.     Palpations: Abdomen is soft.  Musculoskeletal:        General: Normal range of motion.  Skin:    General: Skin is warm.  Neurological:     General: No focal deficit present.     Mental Status: He is alert. Mental status is at baseline.  Psychiatric:        Mood and Affect: Mood normal.        Thought Content: Thought content normal.        Judgment: Judgment normal.    BP (!) 148/99   Pulse 76   Temp 98.2 F (36.8 C)   Wt 206 lb 8 oz (93.7 kg)    SpO2 100%   BMI 29.63 kg/m  Wt Readings from Last 3 Encounters:  02/14/21 206 lb 8 oz (93.7 kg)  10/20/19 197 lb (89.4 kg)  07/20/19 186 lb 3.2 oz (84.5 kg)     Health Maintenance Due  Topic Date Due   Hepatitis C Screening  Never done   COLONOSCOPY (Pts 45-69yrs Insurance coverage will need to be confirmed)  Never done   Zoster Vaccines- Shingrix (1 of 2) Never done    There are no preventive care reminders to display for this patient.  Lab Results  Component Value Date   TSH 1.010 05/07/2018   Lab Results  Component Value Date   WBC 3.5 (L) 04/19/2019   HGB 15.0 04/19/2019   HCT 45.5 04/19/2019   MCV 89.9 04/19/2019   PLT 183 04/19/2019   Lab Results  Component Value Date   NA 142 07/20/2019   K 4.0 07/20/2019   CO2 29 04/19/2019   GLUCOSE 95 07/20/2019   BUN 8 07/20/2019   CREATININE 0.99 07/20/2019   BILITOT 0.9 07/20/2019   ALKPHOS 84 07/20/2019   AST 29 07/20/2019   ALT 25 05/07/2018   PROT 7.2 07/20/2019   ALBUMIN 4.0 07/20/2019   CALCIUM 9.3 07/20/2019   ANIONGAP 9 04/19/2019   Lab Results  Component Value Date   CHOL 187 04/21/2019   Lab Results  Component Value Date   HDL 56 04/21/2019   Lab Results  Component Value Date   LDLCALC 110 (H) 04/21/2019   Lab Results  Component Value Date   TRIG 120 04/21/2019   Lab Results  Component Value Date   CHOLHDL 3.3 04/21/2019   Lab Results  Component Value Date   HGBA1C 5.5 04/30/2018      Assessment & Plan:   Problem List Items Addressed This Visit       Cardiovascular and Mediastinum   Essential hypertension - Primary   Relevant Orders   Basic Metabolic Panel   Lipid Panel   Other Visit Diagnoses     Other hyperlipidemia       Relevant Orders   Lipid Panel   Diabetes mellitus screening       Relevant Orders   HgB A1c      1. Essential hypertension BP (!) 148/99   Pulse 76   Temp 98.2 F (36.8 C)   Wt 206 lb 8 oz (93.7 kg)   SpO2 100%   BMI 29.63 kg/m  -  Continue medication,  monitor blood pressure at home. Continue DASH diet.  Reminder to go to the ER if any CP, SOB, nausea, dizziness, severe HA, changes vision/speech, left arm numbness and tingling and jaw pain. - Basic Metabolic Panel - Lipid Panel  2. Other hyperlipidemia The 10-year ASCVD risk score (Arnett DK, et al., 2019) is: 11.5%   Values used to calculate the score:     Age: 37 years     Sex: Male     Is Non-Hispanic African American: Yes     Diabetic: No     Tobacco smoker: No     Systolic Blood Pressure: 148 mmHg     Is BP treated: Yes     HDL Cholesterol: 48 mg/dL     Total Cholesterol: 198 mg/dL  - Lipid Panel  3. Diabetes mellitus screening  - HgB A1c  Follow-up: Return in about 6 months (around 08/14/2021).    Nolon Nations  APRN, MSN, FNP-C Patient Care [facility] [address] [place], [place] [address] [phone]

## 2021-02-14 NOTE — Patient Instructions (Addendum)
-   Continue medication, monitor blood pressure at home. Continue DASH diet.  Reminder to go to the ER if any CP, SOB, nausea, dizziness, severe HA, changes vision/speech, left arm numbness and tingling and jaw pain.

## 2021-02-15 LAB — BASIC METABOLIC PANEL
BUN/Creatinine Ratio: 10 (ref 9–20)
BUN: 11 mg/dL (ref 6–24)
CO2: 28 mmol/L (ref 20–29)
Calcium: 9.5 mg/dL (ref 8.7–10.2)
Chloride: 101 mmol/L (ref 96–106)
Creatinine, Ser: 1.06 mg/dL (ref 0.76–1.27)
Glucose: 102 mg/dL — ABNORMAL HIGH (ref 70–99)
Potassium: 4 mmol/L (ref 3.5–5.2)
Sodium: 142 mmol/L (ref 134–144)
eGFR: 85 mL/min/{1.73_m2} (ref >59)

## 2021-02-15 LAB — LIPID PANEL
Chol/HDL Ratio: 4.1 ratio (ref 0.0–5.0)
Cholesterol, Total: 198 mg/dL (ref 100–199)
HDL: 48 mg/dL (ref >39)
LDL Chol Calc (NIH): 117 mg/dL — ABNORMAL HIGH (ref 0–99)
Triglycerides: 188 mg/dL — ABNORMAL HIGH (ref 0–149)
VLDL Cholesterol Cal: 33 mg/dL (ref 5–40)

## 2021-02-16 ENCOUNTER — Telehealth: Payer: Self-pay | Admitting: Family Medicine

## 2021-02-16 NOTE — Telephone Encounter (Signed)
Kevin Little is a 50 year old male with a medical history significant for hypertension and hyperlipidemia that presented for chronic conditions.  Reviewed cholesterol panel.  Patient's triglycerides are elevated at 188, goal is less than 150 and LDL is elevated at 117, goal is less than 100.  Recommend low-fat, low carbohydrate diet divided over small meals.  Also, recommend low impact cardiovascular exercise several times a week.  We will follow-up in 6 months as scheduled.   Nolon Nations  APRN, MSN, FNP-C Patient Care [facility] [address] [place], [place] [address] [phone]

## 2021-02-20 ENCOUNTER — Other Ambulatory Visit: Payer: Self-pay

## 2021-02-21 ENCOUNTER — Other Ambulatory Visit: Payer: Self-pay

## 2021-03-14 ENCOUNTER — Other Ambulatory Visit: Payer: Self-pay

## 2021-03-28 ENCOUNTER — Other Ambulatory Visit: Payer: Self-pay

## 2021-03-29 ENCOUNTER — Other Ambulatory Visit: Payer: Self-pay

## 2021-03-30 ENCOUNTER — Telehealth: Payer: Self-pay | Admitting: Family Medicine

## 2021-03-30 NOTE — Telephone Encounter (Signed)
Copied from CRM [id number]. Topic: General - Other >> Apr 01, 2020 12:13 PM Tamela Oddi wrote: Reason for CRM: Patient called to speak with Mikle Bosworth regarding his eligibility for financial assistance.  CB# [phone] >> Apr 01, 2020  2:43 PM Maryjean Morn, CMA wrote: CRM routed to Kevin Little.

## 2021-04-19 ENCOUNTER — Other Ambulatory Visit: Payer: Self-pay

## 2021-05-05 ENCOUNTER — Other Ambulatory Visit: Payer: Self-pay

## 2021-05-08 ENCOUNTER — Other Ambulatory Visit: Payer: Self-pay

## 2021-05-25 ENCOUNTER — Other Ambulatory Visit: Payer: Self-pay

## 2021-05-26 ENCOUNTER — Other Ambulatory Visit: Payer: Self-pay

## 2021-06-13 ENCOUNTER — Other Ambulatory Visit: Payer: Self-pay

## 2021-06-29 ENCOUNTER — Other Ambulatory Visit: Payer: Self-pay

## 2021-06-30 ENCOUNTER — Other Ambulatory Visit: Payer: Self-pay

## 2021-07-25 ENCOUNTER — Other Ambulatory Visit: Payer: Self-pay

## 2021-08-08 ENCOUNTER — Ambulatory Visit: Payer: Self-pay | Admitting: Family Medicine

## 2021-08-14 ENCOUNTER — Other Ambulatory Visit: Payer: Self-pay | Admitting: Family Medicine

## 2021-08-14 DIAGNOSIS — I10 Essential (primary) hypertension: Secondary | ICD-10-CM

## 2021-08-15 ENCOUNTER — Other Ambulatory Visit: Payer: Self-pay

## 2021-08-15 MED ORDER — LISINOPRIL-HYDROCHLOROTHIAZIDE 20-25 MG PO TABS
1.0000 | ORAL_TABLET | Freq: Every day | ORAL | 1 refills | Status: DC
  Filled 2021-08-15: qty 30, 30d supply, fill #0
  Filled 2021-09-25: qty 30, 30d supply, fill #1

## 2021-09-04 ENCOUNTER — Other Ambulatory Visit: Payer: Self-pay

## 2021-09-05 ENCOUNTER — Encounter: Payer: Self-pay | Admitting: Family Medicine

## 2021-09-05 ENCOUNTER — Ambulatory Visit (INDEPENDENT_AMBULATORY_CARE_PROVIDER_SITE_OTHER): Payer: 59 | Admitting: Family Medicine

## 2021-09-05 VITALS — BP 141/93 | HR 67 | Temp 97.5°F | Ht 70.0 in | Wt 202.8 lb

## 2021-09-05 DIAGNOSIS — R339 Retention of urine, unspecified: Secondary | ICD-10-CM

## 2021-09-05 DIAGNOSIS — I10 Essential (primary) hypertension: Secondary | ICD-10-CM

## 2021-09-05 DIAGNOSIS — E7849 Other hyperlipidemia: Secondary | ICD-10-CM | POA: Diagnosis not present

## 2021-09-05 LAB — POCT URINALYSIS DIP (CLINITEK)
Bilirubin, UA: NEGATIVE
Blood, UA: NEGATIVE
Glucose, UA: NEGATIVE mg/dL
Ketones, POC UA: NEGATIVE mg/dL
Leukocytes, UA: NEGATIVE
Nitrite, UA: NEGATIVE
POC PROTEIN,UA: NEGATIVE
Spec Grav, UA: 1.02 (ref 1.010–1.025)
Urobilinogen, UA: 1 E.U./dL
pH, UA: 7 (ref 5.0–8.0)

## 2021-09-05 NOTE — Patient Instructions (Addendum)
Please schedule your follow-up appointment with [facility] urology, you do not need a new referral Remember the importance of taking medications consistently in order to achieve positive outcomes.  Flomax 0.4 mg daily  We will follow-up by phone with any abnormal lab results.  Otherwise, continue to follow a low-fat, low carbohydrate diet divided over small meals throughout the day.  Recommend low impact cardio 3-5 days/week.  We will follow-up in 6 months

## 2021-09-05 NOTE — Progress Notes (Signed)
Patient [facility]   Established Patient Office Visit  Subjective   Patient ID: Kevin Little, male    DOB: Aug 07, 1970  Age: 51 y.o. MRN: [id number]  Chief Complaint  Patient presents with   Follow-up    6 month follow up;Hypertension     Mikaele Stecher is a 51 year old male with a medical history signicant for hypertension, hyperlipidemia, and urinary retention presents for follow-up of chronic conditions.  Patient states that he has been doing well and has minimal complaints. He continues to complain of some urinary retention.  He has been taking Flomax inconsistently.  He states that he only takes his medication whenever he has urinary retention.  Patient was previously evaluated by urology and was maintained on Flomax at that time.  Hypertension This is a chronic problem. The problem is controlled. Pertinent negatives include no anxiety, blurred vision, chest pain, headaches, malaise/fatigue, neck pain, orthopnea, palpitations, peripheral edema, PND, shortness of breath or sweats. Risk factors for coronary artery disease include male gender. Past treatments include calcium channel blockers. Compliance problems include diet.  There is no history of kidney disease, CAD/MI, heart failure or left ventricular hypertrophy.    Patient Active Problem List   Diagnosis Date Noted   Need for immunization against influenza 01/04/2014   Need for Tdap vaccination 10/04/2013   Midline low back pain without sciatica 10/04/2013   CHEST PAIN 02/16/2010   DERMATITIS DUE DRUGS&MEDICINES TAKEN INTERNALLY 08/05/2008   Essential hypertension 01/21/2007   GASTRITIS 01/21/2007   ARTHRITIS 01/21/2007   Past Medical History:  Diagnosis Date   Herpes    Herpes simplex    Hypertension    Sinus problem    History reviewed. No pertinent surgical history. Social History   Tobacco Use   Smoking status: Never   Smokeless tobacco: Never  Vaping Use   Vaping  Use: Never used  Substance Use Topics   Alcohol use: No   Drug use: No   Social History   Socioeconomic History   Marital status: Married    Spouse name: Not on file   Number of children: Not on file   Years of education: Not on file   Highest education level: Not on file  Occupational History   Not on file  Tobacco Use   Smoking status: Never   Smokeless tobacco: Never  Vaping Use   Vaping Use: Never used  Substance and Sexual Activity   Alcohol use: No   Drug use: No   Sexual activity: Yes    Birth control/protection: None  Other Topics Concern   Not on file  Social History Narrative   ** Merged History Encounter **       Social Determinants of Health   Financial Resource Strain: Not on file  Food Insecurity: Not on file  Transportation Needs: Not on file  Physical Activity: Not on file  Stress: Not on file  Social Connections: Not on file  Intimate Partner Violence: Not on file   Family Status  Relation Name Status   Mother  Deceased   Father  Deceased   Family History  Problem Relation Age of Onset   Hypertension Mother    Cancer Father        prostate   Allergies  Allergen Reactions   Omeprazole Rash   Rabeprazole Sodium Rash    ( Aciphex)      Review of Systems  Constitutional: Negative.  Negative for malaise/fatigue.  HENT: Negative.  Eyes: Negative.  Negative for blurred vision.  Respiratory: Negative.  Negative for shortness of breath.   Cardiovascular:  Negative for chest pain, palpitations, orthopnea and PND.  Gastrointestinal: Negative.   Genitourinary: Negative.   Musculoskeletal:  Positive for back pain and joint pain. Negative for neck pain.  Skin: Negative.   Neurological: Negative.  Negative for headaches.  Psychiatric/Behavioral: Negative.        Objective:     BP (!) 141/93   Pulse 67   Temp (!) 97.5 F (36.4 C)   Ht _0  (1.778 m)   Wt 202 lb 12.8 oz (92 kg)   SpO2 99%   BMI 29.10 kg/m  BP Readings from  Last 3 Encounters:  09/05/21 (!) 141/93  02/14/21 (!) 148/99  10/20/19 (!) 144/86   Wt Readings from Last 3 Encounters:  09/05/21 202 lb 12.8 oz (92 kg)  02/14/21 206 lb 8 oz (93.7 kg)  10/20/19 197 lb (89.4 kg)      Physical Exam Constitutional:      Appearance: Normal appearance. He is obese.  Eyes:     Pupils: Pupils are equal, round, and reactive to light.  Cardiovascular:     Rate and Rhythm: Normal rate and regular rhythm.     Pulses: Normal pulses.  Pulmonary:     Effort: Pulmonary effort is normal.     Breath sounds: Normal breath sounds.  Abdominal:     General: Bowel sounds are normal.  Skin:    General: Skin is warm.  Neurological:     General: No focal deficit present.     Mental Status: He is alert. Mental status is at baseline.  Psychiatric:        Mood and Affect: Mood normal.        Behavior: Behavior normal.        Thought Content: Thought content normal.        Judgment: Judgment normal.     No results found for any visits on 09/05/21.  Last CBC Lab Results  Component Value Date   WBC 3.5 (L) 04/19/2019   HGB 15.0 04/19/2019   HCT 45.5 04/19/2019   MCV 89.9 04/19/2019   MCH 29.6 04/19/2019   RDW 12.6 04/19/2019   PLT 183 01/74/9449   Last metabolic panel Lab Results  Component Value Date   GLUCOSE 102 (H) 02/14/2021   NA 142 02/14/2021   K 4.0 02/14/2021   CL 101 02/14/2021   CO2 28 02/14/2021   BUN 11 02/14/2021   CREATININE 1.06 02/14/2021   EGFR 85 02/14/2021   CALCIUM 9.5 02/14/2021   PROT 7.2 07/20/2019   ALBUMIN 4.0 07/20/2019   LABGLOB 3.2 07/20/2019   AGRATIO 1.3 07/20/2019   BILITOT 0.9 07/20/2019   ALKPHOS 84 07/20/2019   AST 29 07/20/2019   ALT 25 05/07/2018   ANIONGAP 9 04/19/2019   Last lipids Lab Results  Component Value Date   CHOL 198 02/14/2021   HDL 48 02/14/2021   LDLCALC 117 (H) 02/14/2021   TRIG 188 (H) 02/14/2021   CHOLHDL 4.1 02/14/2021   Last hemoglobin A1c Lab Results  Component Value Date    HGBA1C 5.5 04/30/2018   Last thyroid functions Lab Results  Component Value Date   TSH 1.010 05/07/2018   Last vitamin D No results found for: "25OHVITD2", "25OHVITD3", "VD25OH" Last vitamin B12 and Folate Lab Results  Component Value Date   [id number] 659 07/20/2019      The 10-year ASCVD risk score (Arnett  DK, et al., 2019) is: 11%    Assessment & Plan:   Problem List Items Addressed This Visit       Cardiovascular and Mediastinum   Essential hypertension - Primary   Relevant Orders   POCT URINALYSIS DIP (CLINITEK) (Completed)   Basic Metabolic Panel   Other Visit Diagnoses     Other hyperlipidemia       Relevant Orders   Lipid Panel   Urinary retention          Hypertension controlled on current medication regimen, no changes warranted on today.- Continue medication, monitor blood pressure at home. Continue DASH diet.  Reminder to go to the ER if any CP, SOB, nausea, dizziness, severe HA, changes vision/speech, left arm numbness and tingling and jaw pain. Discussed the importance of taking medications consistently in order to achieve positive outcomes.  Recommend that patient takes Flomax 0.4 mg daily as prescribed. Will review labs as results become available.  We will notify patient by phone with all abnormal results. Patient advised to follow-up with his urologist, schedule first available appointment.  Phone number provided for patient.  Return in about 6 months (around 03/07/2022).   Donia Pounds  APRN, MSN, FNP-C [facility] [address] [place], [place] [address] [phone]

## 2021-09-06 ENCOUNTER — Other Ambulatory Visit: Payer: Self-pay | Admitting: Pharmacist

## 2021-09-06 ENCOUNTER — Other Ambulatory Visit: Payer: Self-pay

## 2021-09-06 LAB — LIPID PANEL
Chol/HDL Ratio: 3.9 ratio (ref 0.0–5.0)
Cholesterol, Total: 200 mg/dL — ABNORMAL HIGH (ref 100–199)
HDL: 51 mg/dL (ref >39)
LDL Chol Calc (NIH): 129 mg/dL — ABNORMAL HIGH (ref 0–99)
Triglycerides: 114 mg/dL (ref 0–149)
VLDL Cholesterol Cal: 20 mg/dL (ref 5–40)

## 2021-09-06 LAB — BASIC METABOLIC PANEL
BUN/Creatinine Ratio: 13 (ref 9–20)
BUN: 12 mg/dL (ref 6–24)
CO2: 24 mmol/L (ref 20–29)
Calcium: 9.1 mg/dL (ref 8.7–10.2)
Chloride: 100 mmol/L (ref 96–106)
Creatinine, Ser: 0.92 mg/dL (ref 0.76–1.27)
Glucose: 93 mg/dL (ref 70–99)
Potassium: 3.8 mmol/L (ref 3.5–5.2)
Sodium: 140 mmol/L (ref 134–144)
eGFR: 101 mL/min/{1.73_m2} (ref >59)

## 2021-09-06 NOTE — Chronic Care Management (AMB) (Signed)
Patient seen by Jacolyn Reedy, PharmD Candidate on 09/06/21 while they were picking up prescriptions at [facility].   Patient has an automated home blood pressure machine, but did not recall home readings  Medication review was performed. They are taking medications as prescribed.   The following barriers to adherence were noted: - Denies concerns with medication access or understanding.  The following interventions were completed:  - Medications were reviewed - Patient was educated on medications, including indication and administration - Patient was counseled on lifestyle modifications to improve blood pressure  The patient has follow up scheduled:  PCP: 03/13/22   Catie Eppie Gibson, PharmD, [facility] [phone]

## 2021-09-11 ENCOUNTER — Encounter: Payer: Self-pay | Admitting: Family Medicine

## 2021-09-18 ENCOUNTER — Telehealth: Payer: Self-pay | Admitting: Family Medicine

## 2021-09-18 NOTE — Telephone Encounter (Signed)
Kevin Little patient with a history of hypertension, hyperlipidemia, and urinary retention notified office after receiving letter to follow-up on labs. Discussed with patient that LDL cholesterol is 129, goal is to be less than 100.  He will start over-the-counter fish oil tablets, low-fat low carbohydrate diet divided over small meals throughout the day.  Also discussed starting intentional physical activity about 150 minutes/week of low impact cardiovascular exercise.  Patient expressed understanding.  Patient given opportunity for questions.  He will follow-up in 6 months as scheduled.  Nolon Nations  APRN, MSN, FNP-C Patient [facility] [address] [place], [place] [address] [phone]

## 2021-09-18 NOTE — Telephone Encounter (Signed)
Pt would like you to call him at [phone]

## 2021-09-25 ENCOUNTER — Other Ambulatory Visit: Payer: Self-pay

## 2021-09-25 NOTE — Telephone Encounter (Signed)
error 

## 2021-10-09 ENCOUNTER — Other Ambulatory Visit: Payer: Self-pay

## 2021-10-11 ENCOUNTER — Encounter: Payer: Self-pay | Admitting: Urology

## 2021-10-11 ENCOUNTER — Ambulatory Visit (INDEPENDENT_AMBULATORY_CARE_PROVIDER_SITE_OTHER): Payer: 59 | Admitting: Urology

## 2021-10-11 VITALS — BP 142/84 | HR 84 | Ht 74.0 in | Wt 190.0 lb

## 2021-10-11 DIAGNOSIS — Z125 Encounter for screening for malignant neoplasm of prostate: Secondary | ICD-10-CM

## 2021-10-11 DIAGNOSIS — R399 Unspecified symptoms and signs involving the genitourinary system: Secondary | ICD-10-CM

## 2021-10-11 LAB — URINALYSIS, COMPLETE
Bilirubin, UA: NEGATIVE
Glucose, UA: NEGATIVE
Ketones, UA: NEGATIVE
Leukocytes,UA: NEGATIVE
Nitrite, UA: NEGATIVE
RBC, UA: NEGATIVE
Specific Gravity, UA: >1.03 — ABNORMAL HIGH (ref 1.005–1.030)
Urobilinogen, Ur: 0.2 mg/dL (ref 0.2–1.0)
pH, UA: 5 (ref 5.0–7.5)

## 2021-10-11 LAB — MICROSCOPIC EXAMINATION: Bacteria, UA: NONE SEEN

## 2021-10-11 LAB — BLADDER SCAN AMB NON-IMAGING: SCA Result: 21

## 2021-10-12 ENCOUNTER — Telehealth: Payer: Self-pay | Admitting: *Deleted

## 2021-10-12 LAB — PSA: Prostate Specific Ag, Serum: 0.5 ng/mL (ref 0.0–4.0)

## 2021-10-12 NOTE — Telephone Encounter (Signed)
Voicemail full.

## 2021-10-12 NOTE — Telephone Encounter (Signed)
-----   Message from Riki Altes, MD sent at 10/12/2021  8:40 AM EDT ----- PSA normal and stable at 0.5

## 2021-10-14 ENCOUNTER — Encounter: Payer: Self-pay | Admitting: Urology

## 2021-10-14 NOTE — Progress Notes (Signed)
   10/11/2021 9:47 AM   Kevin Little Aug 22, 1970 [id number]  Referring provider: [name], FNP [address] [place],  [place] [address]  Chief Complaint  Patient presents with   Other    HPI: 51 y.o. male who presents for prostate check.  Was seen in 2019 and 2020 for lower urinary tract symptoms and dysuria.  Cystoscopy January 2021 showed no bladder mucosal abnormalities, minimal lateral lobe enlargement.  He has stable lower urinary tract symptoms and remains on tamsulosin.  Denies dysuria, gross hematuria.  No flank, abdominal or pelvic pain.  Last PSA 2020 was 0.5   PMH: Past Medical History:  Diagnosis Date   Herpes    Herpes simplex    Hypertension    Sinus problem     Surgical History: No past surgical history on file.  Home Medications:  Allergies as of 10/11/2021       Reactions   Omeprazole Rash   Rabeprazole Sodium Rash   ( Aciphex)        Medication List        Accurate as of October 11, 2021 11:59 PM. If you have any questions, ask your nurse or doctor.          aspirin EC 81 MG tablet Take 1 tablet (81 mg total) by mouth daily.   lisinopril-hydrochlorothiazide 20-25 MG tablet Commonly known as: ZESTORETIC Take 1 tablet by mouth daily.   tamsulosin 0.4 MG Caps capsule Commonly known as: FLOMAX TAKE 1 CAPSULE (0.4 MG TOTAL) BY MOUTH DAILY.        Allergies:  Allergies  Allergen Reactions   Omeprazole Rash   Rabeprazole Sodium Rash    ( Aciphex)    Family History: Family History  Problem Relation Age of Onset   Hypertension Mother    Cancer Father        prostate    Social History:  reports that he has never smoked. He has never used smokeless tobacco. He reports that he does not drink alcohol and does not use drugs.   Physical Exam: BP (!) 142/84   Pulse 84   Ht 6\' 2"  (1.88 m)   Wt 190 lb (86.2 kg)   BMI 24.39 kg/m   Constitutional:  Alert, no acute distress. HEENT: [place] AT Respiratory: Normal  respiratory effort, no increased work of breathing. GU: Prostate 30 g, smooth without nodules Psychiatric: Normal mood and affect.  Laboratory Data:  Urinalysis Dipstick/microscopy negative   Assessment & Plan:    1. Lower urinary tract symptoms (LUTS) Stable PVR 21 mL Continue tamsulosin  2.  Prostate cancer screening Benign DRE PSA drawn today and will notify with results   , MD  [facility] [address] [place], [place] [phone]

## 2021-11-03 ENCOUNTER — Other Ambulatory Visit: Payer: Self-pay

## 2021-11-03 ENCOUNTER — Other Ambulatory Visit: Payer: Self-pay | Admitting: Family Medicine

## 2021-11-03 DIAGNOSIS — I10 Essential (primary) hypertension: Secondary | ICD-10-CM

## 2021-11-03 MED ORDER — LISINOPRIL-HYDROCHLOROTHIAZIDE 20-25 MG PO TABS
1.0000 | ORAL_TABLET | Freq: Every day | ORAL | 1 refills | Status: DC
  Filled 2021-11-03: qty 30, 30d supply, fill #0
  Filled 2021-12-11: qty 30, 30d supply, fill #1

## 2021-11-14 ENCOUNTER — Other Ambulatory Visit: Payer: Self-pay

## 2021-11-15 ENCOUNTER — Other Ambulatory Visit: Payer: Self-pay

## 2021-12-11 ENCOUNTER — Other Ambulatory Visit: Payer: Self-pay

## 2021-12-12 ENCOUNTER — Other Ambulatory Visit: Payer: Self-pay

## 2021-12-22 ENCOUNTER — Other Ambulatory Visit: Payer: Self-pay

## 2022-01-17 ENCOUNTER — Other Ambulatory Visit: Payer: Self-pay

## 2022-01-17 ENCOUNTER — Other Ambulatory Visit: Payer: Self-pay | Admitting: Family Medicine

## 2022-01-17 DIAGNOSIS — I10 Essential (primary) hypertension: Secondary | ICD-10-CM

## 2022-01-17 MED ORDER — LISINOPRIL-HYDROCHLOROTHIAZIDE 20-25 MG PO TABS
1.0000 | ORAL_TABLET | Freq: Every day | ORAL | 1 refills | Status: DC
  Filled 2022-01-17: qty 30, 30d supply, fill #0
  Filled 2022-02-19: qty 30, 30d supply, fill #1

## 2022-01-18 ENCOUNTER — Other Ambulatory Visit: Payer: Self-pay

## 2022-01-29 ENCOUNTER — Other Ambulatory Visit: Payer: Self-pay | Admitting: Family Medicine

## 2022-01-29 DIAGNOSIS — R300 Dysuria: Secondary | ICD-10-CM

## 2022-01-30 ENCOUNTER — Other Ambulatory Visit: Payer: Self-pay

## 2022-01-30 MED ORDER — TAMSULOSIN HCL 0.4 MG PO CAPS
ORAL_CAPSULE | Freq: Every day | ORAL | 1 refills | Status: DC
  Filled 2022-01-30: qty 30, 30d supply, fill #0
  Filled 2022-03-01: qty 30, 30d supply, fill #1
  Filled 2022-04-11: qty 30, 30d supply, fill #2
  Filled 2022-05-10: qty 30, 30d supply, fill #3
  Filled 2022-06-13: qty 30, 30d supply, fill #4
  Filled 2022-07-11: qty 30, 30d supply, fill #5

## 2022-02-19 ENCOUNTER — Other Ambulatory Visit: Payer: Self-pay

## 2022-03-01 ENCOUNTER — Other Ambulatory Visit: Payer: Self-pay

## 2022-03-02 ENCOUNTER — Other Ambulatory Visit: Payer: Self-pay

## 2022-03-13 ENCOUNTER — Ambulatory Visit: Payer: Self-pay | Admitting: Family Medicine

## 2022-03-29 ENCOUNTER — Other Ambulatory Visit: Payer: Self-pay | Admitting: Family Medicine

## 2022-03-29 ENCOUNTER — Other Ambulatory Visit: Payer: Self-pay

## 2022-03-29 DIAGNOSIS — I10 Essential (primary) hypertension: Secondary | ICD-10-CM

## 2022-03-29 MED ORDER — LISINOPRIL-HYDROCHLOROTHIAZIDE 20-25 MG PO TABS
1.0000 | ORAL_TABLET | Freq: Every day | ORAL | 1 refills | Status: DC
  Filled 2022-03-29: qty 30, 30d supply, fill #0
  Filled 2022-04-09: qty 30, 30d supply, fill #1

## 2022-03-30 ENCOUNTER — Other Ambulatory Visit: Payer: Self-pay

## 2022-04-09 ENCOUNTER — Other Ambulatory Visit: Payer: Self-pay

## 2022-04-11 ENCOUNTER — Other Ambulatory Visit: Payer: Self-pay

## 2022-05-01 ENCOUNTER — Encounter: Payer: Self-pay | Admitting: Family Medicine

## 2022-05-01 ENCOUNTER — Ambulatory Visit (INDEPENDENT_AMBULATORY_CARE_PROVIDER_SITE_OTHER): Payer: Self-pay | Admitting: Family Medicine

## 2022-05-01 ENCOUNTER — Other Ambulatory Visit: Payer: Self-pay

## 2022-05-01 VITALS — BP 135/83 | HR 81 | Temp 97.3°F | Ht 70.0 in | Wt 206.0 lb

## 2022-05-01 DIAGNOSIS — Z1159 Encounter for screening for other viral diseases: Secondary | ICD-10-CM

## 2022-05-01 DIAGNOSIS — Z131 Encounter for screening for diabetes mellitus: Secondary | ICD-10-CM

## 2022-05-01 DIAGNOSIS — Z1211 Encounter for screening for malignant neoplasm of colon: Secondary | ICD-10-CM

## 2022-05-01 DIAGNOSIS — I10 Essential (primary) hypertension: Secondary | ICD-10-CM

## 2022-05-01 MED ORDER — LISINOPRIL-HYDROCHLOROTHIAZIDE 20-25 MG PO TABS
1.0000 | ORAL_TABLET | Freq: Every day | ORAL | 6 refills | Status: DC
  Filled 2022-05-01: qty 30, 30d supply, fill #0
  Filled 2022-06-06: qty 30, 30d supply, fill #1
  Filled 2022-07-12: qty 30, 30d supply, fill #2
  Filled 2022-08-20: qty 30, 30d supply, fill #3
  Filled 2022-09-23: qty 30, 30d supply, fill #4
  Filled 2022-10-26: qty 30, 30d supply, fill #5
  Filled 2022-12-03: qty 30, 30d supply, fill #6

## 2022-05-01 NOTE — Patient Instructions (Signed)
Recommend compression socks daily Recommend insoles for shoes

## 2022-05-01 NOTE — Progress Notes (Signed)
Established Kevin Office Visit  Subjective   Kevin ID: Kevin Little, male    DOB: 12/07/1970  Age: 52 y.o. MRN: [id number]  Chief Complaint  Kevin presents with   Hypertension    Follow up    Kevin Little is a 52 year old male with a medical history significant for hypertension, hyperlipidemia, obesity, and urinary retention presents for follow-up of chronic conditions. Kevin has been doing very well and is well-controlled today.  Hypertension This is a chronic problem. The problem is controlled. Pertinent negatives include no anxiety, blurred vision, chest pain, malaise/fatigue, neck pain, orthopnea, peripheral edema, PND or shortness of breath. Risk factors for coronary artery disease include obesity and sedentary lifestyle. The current treatment provides mild improvement. Compliance problems include diet and exercise.  There is no history of kidney disease, CAD/MI, heart failure or left ventricular hypertrophy. There is no history of chronic renal disease, sleep apnea or a thyroid problem.    Kevin Active Problem List   Diagnosis Date Noted   Need for immunization against influenza 01/04/2014   Need for Tdap vaccination 10/04/2013   Midline low back pain without sciatica 10/04/2013   CHEST PAIN 02/16/2010   DERMATITIS DUE DRUGS&MEDICINES TAKEN INTERNALLY 08/05/2008   Essential hypertension 01/21/2007   GASTRITIS 01/21/2007   ARTHRITIS 01/21/2007   Past Medical History:  Diagnosis Date   Herpes    Herpes simplex    Hypertension    Sinus problem    No past surgical history on file. Social History   Tobacco Use   Smoking status: Never   Smokeless tobacco: Never  Vaping Use   Vaping Use: Never used  Substance Use Topics   Alcohol use: No   Drug use: No   Social History   Socioeconomic History   Marital status: Married    Spouse name: Not on file   Number of children: Not on file   Years of education: Not on file   Highest education level: Not on  file  Occupational History   Not on file  Tobacco Use   Smoking status: Never   Smokeless tobacco: Never  Vaping Use   Vaping Use: Never used  Substance and Sexual Activity   Alcohol use: No   Drug use: No   Sexual activity: Yes    Birth control/protection: None  Other Topics Concern   Not on file  Social History Narrative   ** Merged History Encounter **       Social Determinants of Health   Financial Resource Strain: Not on file  Food Insecurity: Not on file  Transportation Needs: Not on file  Physical Activity: Not on file  Stress: Not on file  Social Connections: Not on file  Intimate Partner Violence: Not on file   Family Status  Relation Name Status   Mother  Deceased   Father  Deceased   Family History  Problem Relation Age of Onset   Hypertension Mother    Cancer Father        prostate   Allergies  Allergen Reactions   Omeprazole Rash   Rabeprazole Sodium Rash    ( Aciphex)    Review of Systems  Constitutional: Negative.  Negative for malaise/fatigue.  HENT: Negative.    Eyes: Negative.  Negative for blurred vision.  Respiratory: Negative.  Negative for shortness of breath.   Cardiovascular: Negative.  Negative for chest pain, orthopnea and PND.  Gastrointestinal: Negative.   Genitourinary: Negative.   Musculoskeletal: Negative.  Negative for neck pain.  Neurological: Negative.   Psychiatric/Behavioral: Negative.    All other systems reviewed and are negative.     Objective:     BP 135/83   Pulse 81   Temp (!) 97.3 F (36.3 C)   Ht 5\' 10"  (1.778 m)   Wt 206 lb (93.4 kg)   SpO2 100%   BMI 29.56 kg/m  BP Readings from Last 3 Encounters:  05/01/22 135/83  10/11/21 (!) 142/84  09/06/21 (!) 142/94   Wt Readings from Last 3 Encounters:  05/01/22 206 lb (93.4 kg)  10/11/21 190 lb (86.2 kg)  09/05/21 202 lb 12.8 oz (92 kg)   SpO2 Readings from Last 3 Encounters:  05/01/22 100%  09/05/21 99%  02/14/21 100%      Physical  Exam Constitutional:      Appearance: Normal appearance.  Eyes:     Pupils: Pupils are equal, round, and reactive to light.  Cardiovascular:     Rate and Rhythm: Normal rate and regular rhythm.     Pulses: Normal pulses.  Abdominal:     General: Bowel sounds are normal.  Musculoskeletal:        General: Normal range of motion.  Skin:    General: Skin is warm.  Neurological:     General: No focal deficit present.     Mental Status: He is alert. Mental status is at baseline.  Psychiatric:        Mood and Affect: Mood normal.        Behavior: Behavior normal.        Thought Content: Thought content normal.        Judgment: Judgment normal.      No results found for any visits on 05/01/22.  Last CBC Lab Results  Component Value Date   WBC 3.5 (L) 04/19/2019   HGB 15.0 04/19/2019   HCT 45.5 04/19/2019   MCV 89.9 04/19/2019   MCH 29.6 04/19/2019   RDW 12.6 04/19/2019   PLT 183 [id number]   Last metabolic panel Lab Results  Component Value Date   GLUCOSE 93 09/05/2021   NA 140 09/05/2021   K 3.8 09/05/2021   CL 100 09/05/2021   CO2 24 09/05/2021   BUN 12 09/05/2021   CREATININE 0.92 09/05/2021   EGFR 101 09/05/2021   CALCIUM 9.1 09/05/2021   PROT 7.2 07/20/2019   ALBUMIN 4.0 07/20/2019   LABGLOB 3.2 07/20/2019   AGRATIO 1.3 07/20/2019   BILITOT 0.9 07/20/2019   ALKPHOS 84 07/20/2019   AST 29 07/20/2019   ALT 25 05/07/2018   ANIONGAP 9 04/19/2019   Last lipids Lab Results  Component Value Date   CHOL 200 (H) 09/05/2021   HDL 51 09/05/2021   LDLCALC 129 (H) 09/05/2021   TRIG 114 09/05/2021   CHOLHDL 3.9 09/05/2021   Last hemoglobin A1c Lab Results  Component Value Date   HGBA1C 5.5 04/30/2018   Last thyroid functions Lab Results  Component Value Date   TSH 1.010 05/07/2018   Last vitamin D No results found for: "25OHVITD2", "25OHVITD3", "VD25OH" Last vitamin B12 and Folate Lab Results  Component Value Date   [id number] 07/20/2019       The 10-year ASCVD risk score (Arnett DK, et al., 2019) is: 10.5%    Assessment & Plan:   Problem List Items Addressed This Visit       Cardiovascular and Mediastinum   Essential hypertension   Relevant Medications   lisinopril-hydrochlorothiazide (ZESTORETIC) 20-25 MG tablet   Other Visit Diagnoses  Colon cancer screening    -  Primary   Relevant Orders   Cologuard   Diabetes mellitus screening       Relevant Orders   HgB A1c   Need for hepatitis C screening test       Relevant Orders   Hepatitis C Antibody      1. Colon cancer screening  - Cologuard  2. Essential hypertension BP 135/83   Pulse 81   Temp (!) 97.3 F (36.3 C)   Ht 5\' 10"  (1.778 m)   Wt 206 lb (93.4 kg)   SpO2 100%   BMI 29.56 kg/m   - lisinopril-hydrochlorothiazide (ZESTORETIC) 20-25 MG tablet; Take 1 tablet by mouth daily.  Dispense: 30 tablet; Refill: 6  3. Diabetes mellitus screening  - HgB A1c  4. Need for hepatitis C screening test  - Hepatitis C Antibody  Return in about 6 months (around 10/30/2022) for hypertension.    Donia Pounds  APRN, MSN, FNP-C Kevin Little [address] [place], [place] [address] [phone]

## 2022-05-02 ENCOUNTER — Other Ambulatory Visit: Payer: Self-pay

## 2022-05-02 LAB — HEPATITIS C ANTIBODY: Hep C Virus Ab: NONREACTIVE

## 2022-05-10 ENCOUNTER — Other Ambulatory Visit: Payer: Self-pay

## 2022-05-24 ENCOUNTER — Telehealth: Payer: Self-pay | Admitting: Family Medicine

## 2022-05-24 NOTE — Telephone Encounter (Signed)
Pt called in wanted to speak w/ a nurse about his blood work.

## 2022-05-30 ENCOUNTER — Ambulatory Visit (HOSPITAL_COMMUNITY)
Admission: EM | Admit: 2022-05-30 | Discharge: 2022-05-30 | Disposition: A | Discharge status: Home / Self Care | Payer: Self-pay | Attending: Physician Assistant | Admitting: Physician Assistant

## 2022-05-30 ENCOUNTER — Encounter (HOSPITAL_COMMUNITY): Payer: Self-pay

## 2022-05-30 ENCOUNTER — Ambulatory Visit (INDEPENDENT_AMBULATORY_CARE_PROVIDER_SITE_OTHER): Payer: Self-pay

## 2022-05-30 DIAGNOSIS — I10 Essential (primary) hypertension: Secondary | ICD-10-CM

## 2022-05-30 DIAGNOSIS — Z131 Encounter for screening for diabetes mellitus: Secondary | ICD-10-CM

## 2022-05-30 DIAGNOSIS — Z833 Family history of diabetes mellitus: Secondary | ICD-10-CM

## 2022-05-30 DIAGNOSIS — M79601 Pain in right arm: Secondary | ICD-10-CM

## 2022-05-30 DIAGNOSIS — S39012A Strain of muscle, fascia and tendon of lower back, initial encounter: Secondary | ICD-10-CM

## 2022-05-30 DIAGNOSIS — M545 Low back pain, unspecified: Secondary | ICD-10-CM

## 2022-05-30 LAB — CBG MONITORING, ED: Glucose-Capillary: 104 mg/dL — ABNORMAL HIGH (ref 70–99)

## 2022-05-30 MED ORDER — METHOCARBAMOL 500 MG PO TABS
500.0000 mg | ORAL_TABLET | Freq: Two times a day (BID) | ORAL | 0 refills | Status: DC
  Filled 2022-05-30: qty 20, 10d supply, fill #0

## 2022-05-30 NOTE — Discharge Instructions (Signed)
Your x-ray was normal but did show some arthritis in the lower part of your spine.  There was no evidence of fracture or dislocation.  Take Robaxin up to twice a day.  This make you sleepy so do not drive or drink alcohol with taking it.  I recommend using over-the-counter medication including Tylenol.  Use heat and gentle rest for symptom relief.  If your symptoms or not improving quickly please follow-up with sports medicine; call to schedule an appointment.  If anything worsens and you have increasing pain, weakness, bowel/bladder incontinence, numbness or tingling in your legs or arms you should be seen immediately.  Your blood sugar was appropriate for having eaten 4 hours ago.  Follow-up with your primary care.  Your blood pressure was elevated.  Please take your medication when you get home.  Limit NSAIDs including aspirin, ibuprofen/Advil, naproxen/Aleve).  Avoid decongestants, caffeine, sodium.  Monitor your blood pressure at home and keep a log for evaluation of follow-up appointment with your primary care.  If you develop any chest pain, shortness of breath, headache, vision change, dizziness in the setting of high blood pressure you need to go to the emergency room.
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
## 2022-05-31 ENCOUNTER — Other Ambulatory Visit: Payer: Self-pay

## 2022-05-31 NOTE — Telephone Encounter (Signed)
Lvm for pt to call back for lab results . Kh

## 2022-06-01 ENCOUNTER — Other Ambulatory Visit: Payer: Self-pay

## 2022-06-06 ENCOUNTER — Other Ambulatory Visit: Payer: Self-pay

## 2022-06-06 NOTE — Telephone Encounter (Signed)
Lvm for pt to call back if he still need labs results . Kevin Little

## 2022-06-07 ENCOUNTER — Other Ambulatory Visit: Payer: Self-pay

## 2022-06-13 ENCOUNTER — Other Ambulatory Visit: Payer: Self-pay

## 2022-06-18 ENCOUNTER — Other Ambulatory Visit: Payer: Self-pay

## 2022-07-11 ENCOUNTER — Other Ambulatory Visit: Payer: Self-pay

## 2022-07-12 ENCOUNTER — Other Ambulatory Visit: Payer: Self-pay

## 2022-08-21 ENCOUNTER — Other Ambulatory Visit: Payer: Self-pay

## 2022-08-27 ENCOUNTER — Other Ambulatory Visit: Payer: Self-pay

## 2022-08-27 ENCOUNTER — Other Ambulatory Visit: Payer: Self-pay | Admitting: Family Medicine

## 2022-08-27 DIAGNOSIS — R300 Dysuria: Secondary | ICD-10-CM

## 2022-08-27 MED ORDER — TAMSULOSIN HCL 0.4 MG PO CAPS
0.4000 mg | ORAL_CAPSULE | Freq: Every day | ORAL | 1 refills | Status: DC
  Filled 2022-08-27: qty 90, 90d supply, fill #0
  Filled 2022-12-06: qty 90, 90d supply, fill #1

## 2022-09-24 ENCOUNTER — Other Ambulatory Visit: Payer: Self-pay

## 2022-10-26 ENCOUNTER — Other Ambulatory Visit: Payer: Self-pay

## 2022-10-29 ENCOUNTER — Other Ambulatory Visit: Payer: Self-pay

## 2022-10-30 ENCOUNTER — Ambulatory Visit (INDEPENDENT_AMBULATORY_CARE_PROVIDER_SITE_OTHER): Payer: Self-pay | Admitting: Family Medicine

## 2022-10-30 VITALS — BP 133/77 | HR 68 | Temp 97.1°F | Wt 205.0 lb

## 2022-10-30 DIAGNOSIS — I10 Essential (primary) hypertension: Secondary | ICD-10-CM

## 2022-10-30 DIAGNOSIS — Z131 Encounter for screening for diabetes mellitus: Secondary | ICD-10-CM

## 2022-10-30 DIAGNOSIS — E7849 Other hyperlipidemia: Secondary | ICD-10-CM

## 2022-10-30 NOTE — Progress Notes (Signed)
Established Patient Office Visit  Subjective   Patient ID: Kevin Little, male    DOB: [birth year]  Age: 52 y.o. MRN: [id number]  Chief Complaint  Patient presents with   Hypertension    Follow up    Kevin Little is a very pleasant 52 year old male with a medical history significant for hypertension and hyperlipidemia that presents for follow-up of chronic conditions.  Patient states that he has been doing well and is without complaint today.  He has been taking all prescribed medications consistently.  Patient has also been following a heart healthy diet.  Patient has not been exercising consistently.  Hypertension This is a chronic problem. The problem is controlled. Pertinent negatives include no anxiety, blurred vision, chest pain, headaches, malaise/fatigue, neck pain, orthopnea or palpitations. Risk factors for coronary artery disease include male gender, sedentary lifestyle and dyslipidemia. There is no history of kidney disease or CAD/MI.    Patient Active Problem List   Diagnosis Date Noted   Need for immunization against influenza 01/04/2014   Need for Tdap vaccination 10/04/2013   Midline low back pain without sciatica 10/04/2013   CHEST PAIN 02/16/2010   DERMATITIS DUE DRUGS&MEDICINES TAKEN INTERNALLY 08/05/2008   Essential hypertension 01/21/2007   GASTRITIS 01/21/2007   ARTHRITIS 01/21/2007   Past Medical History:  Diagnosis Date   Herpes    Herpes simplex    Hypertension    Sinus problem    No past surgical history on file. Social History   Tobacco Use   Smoking status: Never   Smokeless tobacco: Never  Vaping Use   Vaping status: Never Used  Substance Use Topics   Alcohol use: No   Drug use: No   Social History   Socioeconomic History   Marital status: Married    Spouse name: Not on file   Number of children: Not on file   Years of education: Not on file   Highest education level: Not on file  Occupational History   Not on file  Tobacco  Use   Smoking status: Never   Smokeless tobacco: Never  Vaping Use   Vaping status: Never Used  Substance and Sexual Activity   Alcohol use: No   Drug use: No   Sexual activity: Yes    Birth control/protection: None  Other Topics Concern   Not on file  Social History Narrative   ** Merged History Encounter **       Social Determinants of Health   Financial Resource Strain: Not on file  Food Insecurity: Not on file  Transportation Needs: Not on file  Physical Activity: Not on file  Stress: Not on file  Social Connections: Unknown (02/19/2022)   Received from [facility]   Social Network    Social Network: Not on file  Intimate Partner Violence: Unknown (02/19/2022)   [facility]   HITS    Physically Hurt: Not on file    Insult or Talk Down To: Not on file    Threaten Physical Harm: Not on file    Scream or Curse: Not on file   Family Status  Relation Name Status   Mother  Deceased   Father  Deceased  No partnership data on file   Family History  Problem Relation Age of Onset   Hypertension Mother    Cancer Father        prostate   Allergies  Allergen Reactions   Omeprazole Rash   Rabeprazole Sodium Rash    (  Aciphex)      Review of Systems  Constitutional:  Negative for malaise/fatigue.  Eyes:  Negative for blurred vision.  Respiratory: Negative.    Cardiovascular: Negative.  Negative for chest pain, palpitations and orthopnea.  Genitourinary: Negative.   Musculoskeletal:  Positive for back pain and joint pain. Negative for neck pain.  Neurological:  Negative for headaches.      Objective:     BP 133/77   Pulse 68   Temp (!) 97.1 F (36.2 C)   Wt 205 lb (93 kg)   SpO2 100%   BMI 29.41 kg/m  BP Readings from Last 3 Encounters:  10/30/22 133/77  05/30/22 (!) 151/97  05/01/22 135/83   Wt Readings from Last 3 Encounters:  10/30/22 205 lb (93 kg)  05/30/22 192 lb (87.1 kg)  05/01/22 206 lb  (93.4 kg)      Physical Exam Constitutional:      Appearance: Normal appearance.  Eyes:     Pupils: Pupils are equal, round, and reactive to light.  Cardiovascular:     Rate and Rhythm: Normal rate and regular rhythm.  Pulmonary:     Effort: Pulmonary effort is normal.  Abdominal:     General: Bowel sounds are normal.  Skin:    General: Skin is warm.  Neurological:     General: No focal deficit present.     Mental Status: He is alert. Mental status is at baseline.  Psychiatric:        Mood and Affect: Mood normal.        Behavior: Behavior normal.        Thought Content: Thought content normal.        Judgment: Judgment normal.      Results for orders placed or performed in visit on 10/30/22  Lipid Panel  Result Value Ref Range   Cholesterol, Total 189 100 - 199 mg/dL   Triglycerides 88 0 - 149 mg/dL   HDL 50 >44 mg/dL   VLDL Cholesterol Cal 16 5 - 40 mg/dL   LDL Chol Calc (NIH) 010 (H) 0 - 99 mg/dL   Chol/HDL Ratio 3.8 0.0 - 5.0 ratio  Comprehensive metabolic panel  Result Value Ref Range   Glucose 108 (H) 70 - 99 mg/dL   BUN 11 6 - 24 mg/dL   Creatinine, Ser 2.72 0.76 - 1.27 mg/dL   eGFR 84 >53 GU/YQI/3.47   BUN/Creatinine Ratio 10 9 - 20   Sodium 141 134 - 144 mmol/L   Potassium 3.7 3.5 - 5.2 mmol/L   Chloride 102 96 - 106 mmol/L   CO2 26 20 - 29 mmol/L   Calcium 9.8 8.7 - 10.2 mg/dL   Total Protein 7.5 6.0 - 8.5 g/dL   Albumin 4.6 3.8 - 4.9 g/dL   Globulin, Total 2.9 1.5 - 4.5 g/dL   Bilirubin Total 0.8 0.0 - 1.2 mg/dL   Alkaline Phosphatase 80 44 - 121 IU/L   AST 31 0 - 40 IU/L   ALT 30 0 - 44 IU/L  Hemoglobin A1c  Result Value Ref Range   Hgb A1c MFr Bld 6.2 (H) 4.8 - 5.6 %   Est. average glucose Bld gHb Est-mCnc 131 mg/dL    Last CBC Lab Results  Component Value Date   WBC 3.5 (L) 04/19/2019   HGB 15.0 04/19/2019   HCT 45.5 04/19/2019   MCV 89.9 04/19/2019   MCH 29.6 04/19/2019   RDW 12.6 04/19/2019   PLT 183 04/19/2019   Last metabolic  panel Lab Results  Component Value Date   GLUCOSE 108 (H) 10/30/2022   NA 141 10/30/2022   K 3.7 10/30/2022   CL 102 10/30/2022   CO2 26 10/30/2022   BUN 11 10/30/2022   CREATININE 1.06 10/30/2022   EGFR 84 10/30/2022   CALCIUM 9.8 10/30/2022   PROT 7.5 10/30/2022   ALBUMIN 4.6 10/30/2022   LABGLOB 2.9 10/30/2022   AGRATIO 1.3 07/20/2019   BILITOT 0.8 10/30/2022   ALKPHOS 80 10/30/2022   AST 31 10/30/2022   ALT 30 10/30/2022   ANIONGAP 9 04/19/2019   Last lipids Lab Results  Component Value Date   CHOL 189 10/30/2022   HDL 50 10/30/2022   LDLCALC 123 (H) 10/30/2022   TRIG 88 10/30/2022   CHOLHDL 3.8 10/30/2022   Last hemoglobin A1c Lab Results  Component Value Date   HGBA1C 6.2 (H) 10/30/2022   Last thyroid functions Lab Results  Component Value Date   TSH 1.010 05/07/2018   Last vitamin D No results found for: "25OHVITD2", "25OHVITD3", "VD25OH" Last vitamin B12 and Folate Lab Results  Component Value Date   VITAMINB12 565 07/20/2019      The 10-year ASCVD risk score (Arnett DK, et al., 2019) is: 10.1%    Assessment & Plan:   Problem List Items Addressed This Visit       Cardiovascular and Mediastinum   Essential hypertension - Primary   Relevant Orders   Comprehensive metabolic panel (Completed)   Other Visit Diagnoses     Other hyperlipidemia       Relevant Orders   Lipid Panel (Completed)   Diabetes mellitus screening       Relevant Orders   Hemoglobin A1c (Completed)      1. Essential hypertension BP 133/77   Pulse 68   Temp (!) 97.1 F (36.2 C)   Wt 205 lb (93 kg)   SpO2 100%   BMI 29.41 kg/m  - Continue medication, monitor blood pressure at home. Continue DASH diet.  Reminder to go to the ER if any CP, SOB, nausea, dizziness, severe HA, changes vision/speech, left arm numbness and tingling and jaw pain.   - Comprehensive metabolic panel  2. Other hyperlipidemia The 10-year ASCVD risk score (Arnett DK, et al., 2019) is:  10.1%   Values used to calculate the score:     Age: 89 years     Sex: Male     Is Non-Hispanic African American: Yes     Diabetic: No     Tobacco smoker: No     Systolic Blood Pressure: 133 mmHg     Is BP treated: Yes     HDL Cholesterol: 50 mg/dL     Total Cholesterol: 189 mg/dL  - Lipid Panel  3. Diabetes mellitus screening Patient's hemoglobin A1c is 6.2.  Patient advised to follow a carbohydrate modified diet over the next 6 months.  Will recheck A1c at that time.  Will consider starting metformin.  Patient is not interested in starting a new medication at this time.   - Hemoglobin A1c  Return in about 6 months (around 05/02/2023) for hypertension, hyperlipidemia.   Nolon Nations  APRN, MSN, FNP-C Patient [facility] [address] [place], [place] [address] [phone]

## 2022-10-30 NOTE — Patient Instructions (Signed)
Hypertension, Adult Hypertension is another name for high blood pressure. High blood pressure forces your heart to work harder to pump blood. This can cause problems over time. There are two numbers in a blood pressure reading. There is a top number (systolic) over a bottom number (diastolic). It is best to have a blood pressure that is below 120/80. What are the causes? The cause of this condition is not known. Some other conditions can lead to high blood pressure. What increases the risk? Some lifestyle factors can make you more likely to develop high blood pressure: Smoking. Not getting enough exercise or physical activity. Being overweight. Having too much fat, sugar, calories, or salt (sodium) in your diet. Drinking too much alcohol. Other risk factors include: Having any of these conditions: Heart disease. Diabetes. High cholesterol. Kidney disease. Obstructive sleep apnea. Having a family history of high blood pressure and high cholesterol. Age. The risk increases with age. Stress. What are the signs or symptoms? High blood pressure may not cause symptoms. Very high blood pressure (hypertensive crisis) may cause: Headache. Fast or uneven heartbeats (palpitations). Shortness of breath. Nosebleed. Vomiting or feeling like you may vomit (nauseous). Changes in how you see. Very bad chest pain. Feeling dizzy. Seizures. How is this treated? This condition is treated by making healthy lifestyle changes, such as: Eating healthy foods. Exercising more. Drinking less alcohol. Your doctor may prescribe medicine if lifestyle changes do not help enough and if: Your top number is above 130. Your bottom number is above 80. Your personal target blood pressure may vary. Follow these instructions at home: Eating and drinking  If told, follow the DASH eating plan. To follow this plan: Fill one half of your plate at each meal with fruits and vegetables. Fill one fourth of your plate  at each meal with whole grains. Whole grains include whole-wheat pasta, brown rice, and whole-grain bread. Eat or drink low-fat dairy products, such as skim milk or low-fat yogurt. Fill one fourth of your plate at each meal with low-fat (lean) proteins. Low-fat proteins include fish, chicken without skin, eggs, beans, and tofu. Avoid fatty meat, cured and processed meat, or chicken with skin. Avoid pre-made or processed food. Limit the amount of salt in your diet to less than 1,500 mg each day. Do not drink alcohol if: Your doctor tells you not to drink. You are pregnant, may be pregnant, or are planning to become pregnant. If you drink alcohol: Limit how much you have to: 0-1 drink a day for women. 0-2 drinks a day for men. Know how much alcohol is in your drink. In the [place] one drink equals one 12 oz bottle of beer (355 mL), one 5 oz glass of wine (148 mL), or one 1 oz glass of hard liquor (44 mL). Lifestyle  Work with your doctor to stay at a healthy weight or to lose weight. Ask your doctor what the best weight is for you. Get at least 30 minutes of exercise that causes your heart to beat faster (aerobic exercise) most days of the week. This may include walking, swimming, or biking. Get at least 30 minutes of exercise that strengthens your muscles (resistance exercise) at least 3 days a week. This may include lifting weights or doing Pilates. Do not smoke or use any products that contain nicotine or tobacco. If you need help quitting, ask your doctor. Check your blood pressure at home as told by your doctor. Keep all follow-up visits. Medicines Take over-the-counter and prescription medicines   only as told by your doctor. Follow directions carefully. Do not skip doses of blood pressure medicine. The medicine does not work as well if you skip doses. Skipping doses also puts you at risk for problems. Ask your doctor about side effects or reactions to medicines that you should watch  for. Contact a doctor if: You think you are having a reaction to the medicine you are taking. You have headaches that keep coming back. You feel dizzy. You have swelling in your ankles. You have trouble with your vision. Get help right away if: You get a very bad headache. You start to feel mixed up (confused). You feel weak or numb. You feel faint. You have very bad pain in your: Chest. Belly (abdomen). You vomit more than once. You have trouble breathing. These symptoms may be an emergency. Get help right away. Call 911. Do not wait to see if the symptoms will go away. Do not drive yourself to the hospital. Summary Hypertension is another name for high blood pressure. High blood pressure forces your heart to work harder to pump blood. For most people, a normal blood pressure is less than 120/80. Making healthy choices can help lower blood pressure. If your blood pressure does not get lower with healthy choices, you may need to take medicine. This information is not intended to replace advice given to you by your health care provider. Make sure you discuss any questions you have with your health care provider. Document Revised: 12/22/2020 Document Reviewed: 12/22/2020 Elsevier Patient Education  2024 [employer]vier Inc.  

## 2022-12-03 ENCOUNTER — Other Ambulatory Visit: Payer: Self-pay

## 2022-12-06 ENCOUNTER — Other Ambulatory Visit: Payer: Self-pay

## 2022-12-07 ENCOUNTER — Other Ambulatory Visit: Payer: Self-pay

## 2022-12-21 ENCOUNTER — Other Ambulatory Visit: Payer: Self-pay | Admitting: Family Medicine

## 2022-12-21 DIAGNOSIS — Z1211 Encounter for screening for malignant neoplasm of colon: Secondary | ICD-10-CM

## 2022-12-21 DIAGNOSIS — Z1212 Encounter for screening for malignant neoplasm of rectum: Secondary | ICD-10-CM

## 2023-01-01 ENCOUNTER — Other Ambulatory Visit: Payer: Self-pay | Admitting: Family Medicine

## 2023-01-01 ENCOUNTER — Other Ambulatory Visit: Payer: Self-pay

## 2023-01-01 DIAGNOSIS — I10 Essential (primary) hypertension: Secondary | ICD-10-CM

## 2023-01-01 MED ORDER — LISINOPRIL-HYDROCHLOROTHIAZIDE 20-25 MG PO TABS
1.0000 | ORAL_TABLET | Freq: Every day | ORAL | 6 refills | Status: DC
Start: 2023-01-01 — End: 2023-05-07
  Filled 2023-01-01: qty 30, 30d supply, fill #0
  Filled 2023-02-07: qty 30, 30d supply, fill #1
  Filled 2023-03-11: qty 30, 30d supply, fill #2
  Filled 2023-04-15 (×2): qty 30, 30d supply, fill #3

## 2023-01-07 ENCOUNTER — Other Ambulatory Visit: Payer: Self-pay

## 2023-02-07 ENCOUNTER — Other Ambulatory Visit: Payer: Self-pay

## 2023-02-08 ENCOUNTER — Other Ambulatory Visit: Payer: Self-pay

## 2023-03-11 ENCOUNTER — Other Ambulatory Visit: Payer: Self-pay

## 2023-03-11 ENCOUNTER — Other Ambulatory Visit: Payer: Self-pay | Admitting: Family Medicine

## 2023-03-11 DIAGNOSIS — R300 Dysuria: Secondary | ICD-10-CM

## 2023-03-11 MED ORDER — TAMSULOSIN HCL 0.4 MG PO CAPS
0.4000 mg | ORAL_CAPSULE | Freq: Every day | ORAL | 1 refills | Status: DC
  Filled 2023-03-11: qty 90, 90d supply, fill #0
  Filled 2023-06-24: qty 90, 90d supply, fill #1

## 2023-03-14 ENCOUNTER — Other Ambulatory Visit: Payer: Self-pay

## 2023-04-15 ENCOUNTER — Other Ambulatory Visit: Payer: Self-pay

## 2023-05-07 ENCOUNTER — Ambulatory Visit: Payer: Self-pay | Admitting: Family Medicine

## 2023-05-07 ENCOUNTER — Other Ambulatory Visit: Payer: Self-pay

## 2023-05-07 ENCOUNTER — Ambulatory Visit (INDEPENDENT_AMBULATORY_CARE_PROVIDER_SITE_OTHER): Payer: Self-pay | Admitting: Nurse Practitioner

## 2023-05-07 ENCOUNTER — Other Ambulatory Visit: Payer: Self-pay | Admitting: Nurse Practitioner

## 2023-05-07 ENCOUNTER — Encounter: Payer: Self-pay | Admitting: Nurse Practitioner

## 2023-05-07 VITALS — BP 132/74 | HR 71 | Temp 97.5°F | Wt 208.0 lb

## 2023-05-07 DIAGNOSIS — R399 Unspecified symptoms and signs involving the genitourinary system: Secondary | ICD-10-CM | POA: Insufficient documentation

## 2023-05-07 DIAGNOSIS — I10 Essential (primary) hypertension: Secondary | ICD-10-CM

## 2023-05-07 MED ORDER — LISINOPRIL-HYDROCHLOROTHIAZIDE 20-25 MG PO TABS
1.0000 | ORAL_TABLET | Freq: Every day | ORAL | 1 refills | Status: AC
Start: 2023-05-07 — End: ?
  Filled 2023-05-07 – 2023-05-22 (×2): qty 90, 90d supply, fill #0
  Filled 2023-09-02 (×2): qty 90, 90d supply, fill #1

## 2023-05-07 NOTE — Assessment & Plan Note (Signed)
 Chronic medical condition currently well-controlled on lisinopril-hydrochlorothiazide 20-25 mg 1 tablet daily Continue current medication DASH diet and commitment to daily physical activity for a minimum of 30 minutes discussed and encouraged, as a part of hypertension management.      05/07/2023   10:24 AM 05/07/2023   10:19 AM 10/30/2022   10:40 AM 05/30/2022    5:23 PM 05/30/2022    5:22 PM 05/01/2022   11:07 AM 10/11/2021    2:33 PM  BP/Weight  Systolic BP 132 140 133 151  135 142  Diastolic BP 74 70 77 97  83 84  Wt. (Lbs)  208 205  192 206 190  BMI  29.84 kg/m2 29.41 kg/m2  27.55 kg/m2 29.56 kg/m2 24.39 kg/m2

## 2023-05-07 NOTE — Progress Notes (Signed)
 Established Patient Office Visit  Subjective:  Patient ID: Kevin Little, male    DOB: [birth year]  Age: 53 y.o. MRN: [id number]  CC:  Chief Complaint  Patient presents with   Hypertension    HPI Kevin Little is a 53 y.o. male  has a past medical history of Herpes, Herpes simplex, Hypertension, and Sinus problem.  Patient presented establish care with new provider and for follow-up for his chronic medical conditions  Hypertension.  Currently on lisinopril-hydrochlorothiazide 20-25 mg 1 tablet daily.  Patient denies chest pain shortness of breath edema  LUTS.  Takes tamsulosin 0.4 mg capsule once daily.  Patient denies difficulty with urination, states that he is able to sleep all night without making up to urinate.  He reports intermittent dysuria. No other  symptoms.    Past Medical History:  Diagnosis Date   Herpes    Herpes simplex    Hypertension    Sinus problem     History reviewed. No pertinent surgical history.  Family History  Problem Relation Age of Onset   Hypertension Mother    Cancer Father        prostate    Social History   Socioeconomic History   Marital status: Married    Spouse name: Not on file   Number of children: 2   Years of education: Not on file   Highest education level: Not on file  Occupational History   Not on file  Tobacco Use   Smoking status: Never   Smokeless tobacco: Never  Vaping Use   Vaping status: Never Used  Substance and Sexual Activity   Alcohol use: No   Drug use: No   Sexual activity: Yes    Birth control/protection: None  Other Topics Concern   Not on file  Social History Narrative   Lives with his wife    Social Drivers of Corporate investment banker Strain: Not on file  Food Insecurity: Not on file  Transportation Needs: Not on file  Physical Activity: Not on file  Stress: Not on file  Social Connections: Unknown (02/19/2022)   [facility]   Social Network    Social  Network: Not on file  Intimate Partner Violence: Unknown (02/19/2022)   Received from [employer], [facility]   HITS    Physically Hurt: Not on file    Insult or Talk Down To: Not on file    Threaten Physical Harm: Not on file    Scream or Curse: Not on file    Outpatient Medications Prior to Visit  Medication Sig Dispense Refill   aspirin EC 81 MG tablet Take 1 tablet (81 mg total) by mouth daily. 30 tablet 11   tamsulosin (FLOMAX) 0.4 MG CAPS capsule Take 1 capsule (0.4 mg total) by mouth daily. 90 capsule 1   lisinopril-hydrochlorothiazide (ZESTORETIC) 20-25 MG tablet Take 1 tablet by mouth daily.Please have patient contact office for an appointment. 30 tablet 6   methocarbamol (ROBAXIN) 500 MG tablet Take 1 tablet (500 mg total) by mouth 2 (two) times daily. (Patient not taking: Reported on 05/07/2023) 20 tablet 0   No facility-administered medications prior to visit.    Allergies  Allergen Reactions   Omeprazole Rash   Rabeprazole Sodium Rash    ( Aciphex)    ROS Review of Systems  Constitutional:  Negative for appetite change, chills, fatigue and fever.  HENT:  Negative for congestion, postnasal drip, rhinorrhea and sneezing.   Respiratory:  Negative for cough, shortness of breath and wheezing.   Cardiovascular:  Negative for chest pain, palpitations and leg swelling.  Gastrointestinal:  Negative for abdominal pain, constipation, nausea and vomiting.  Genitourinary:  Positive for dysuria. Negative for difficulty urinating, flank pain and frequency.  Musculoskeletal:  Negative for arthralgias, back pain, joint swelling and myalgias.  Skin:  Negative for color change, pallor, rash and wound.  Neurological:  Negative for dizziness, facial asymmetry, weakness, numbness and headaches.  Psychiatric/Behavioral:  Negative for behavioral problems, confusion, self-injury and suicidal ideas.       Objective:    Physical Exam Vitals and nursing note reviewed.   Constitutional:      General: He is not in acute distress.    Appearance: Normal appearance. He is not ill-appearing, toxic-appearing or diaphoretic.  HENT:     Mouth/Throat:     Mouth: Mucous membranes are moist.     Pharynx: Oropharynx is clear. No oropharyngeal exudate or posterior oropharyngeal erythema.  Eyes:     General: No scleral icterus.       Right eye: No discharge.        Left eye: No discharge.     Extraocular Movements: Extraocular movements intact.     Conjunctiva/sclera: Conjunctivae normal.  Cardiovascular:     Rate and Rhythm: Normal rate and regular rhythm.     Pulses: Normal pulses.     Heart sounds: Normal heart sounds. No murmur heard.    No friction rub. No gallop.  Pulmonary:     Effort: Pulmonary effort is normal. No respiratory distress.     Breath sounds: Normal breath sounds. No stridor. No wheezing, rhonchi or rales.  Chest:     Chest wall: No tenderness.  Abdominal:     General: There is no distension.     Palpations: Abdomen is soft.     Tenderness: There is no abdominal tenderness. There is no right CVA tenderness, left CVA tenderness or guarding.  Musculoskeletal:        General: No swelling, tenderness, deformity or signs of injury.     Right lower leg: No edema.     Left lower leg: No edema.  Skin:    General: Skin is warm and dry.     Capillary Refill: Capillary refill takes less than 2 seconds.     Coloration: Skin is not jaundiced or pale.     Findings: No bruising, erythema or lesion.  Neurological:     Mental Status: He is alert and oriented to person, place, and time.     Motor: No weakness.     Coordination: Coordination normal.     Gait: Gait normal.  Psychiatric:        Mood and Affect: Mood normal.        Behavior: Behavior normal.        Thought Content: Thought content normal.        Judgment: Judgment normal.     BP 132/74   Pulse 71   Temp (!) 97.5 F (36.4 C)   Wt 208 lb (94.3 kg)   SpO2 100%   BMI 29.84 kg/m   Wt Readings from Last 3 Encounters:  05/07/23 208 lb (94.3 kg)  10/30/22 205 lb (93 kg)  05/30/22 192 lb (87.1 kg)    Lab Results  Component Value Date   TSH 1.010 05/07/2018   Lab Results  Component Value Date   WBC 3.5 (L) 04/19/2019   HGB 15.0 04/19/2019   HCT 45.5 04/19/2019  MCV 89.9 04/19/2019   PLT 183 04/19/2019   Lab Results  Component Value Date   NA 141 10/30/2022   K 3.7 10/30/2022   CO2 26 10/30/2022   GLUCOSE 108 (H) 10/30/2022   BUN 11 10/30/2022   CREATININE 1.06 10/30/2022   BILITOT 0.8 10/30/2022   ALKPHOS 80 10/30/2022   AST 31 10/30/2022   ALT 30 10/30/2022   PROT 7.5 10/30/2022   ALBUMIN 4.6 10/30/2022   CALCIUM 9.8 10/30/2022   ANIONGAP 9 04/19/2019   EGFR 84 10/30/2022   Lab Results  Component Value Date   CHOL 189 10/30/2022   Lab Results  Component Value Date   HDL 50 10/30/2022   Lab Results  Component Value Date   LDLCALC 123 (H) 10/30/2022   Lab Results  Component Value Date   TRIG 88 10/30/2022   Lab Results  Component Value Date   CHOLHDL 3.8 10/30/2022   Lab Results  Component Value Date   HGBA1C 6.2 (H) 10/30/2022      Assessment & Plan:   Problem List Items Addressed This Visit       Cardiovascular and Mediastinum   Essential hypertension - Primary   Chronic medical condition currently well-controlled on lisinopril-hydrochlorothiazide 20-25 mg 1 tablet daily Continue current medication DASH diet and commitment to daily physical activity for a minimum of 30 minutes discussed and encouraged, as a part of hypertension management.      05/07/2023   10:24 AM 05/07/2023   10:19 AM 10/30/2022   10:40 AM 05/30/2022    5:23 PM 05/30/2022    5:22 PM 05/01/2022   11:07 AM 10/11/2021    2:33 PM  BP/Weight  Systolic BP 132 140 133 151  135 142  Diastolic BP 74 70 77 97  83 84  Wt. (Lbs)  208 205  192 206 190  BMI  29.84 kg/m2 29.41 kg/m2  27.55 kg/m2 29.56 kg/m2 24.39 kg/m2           Relevant Medications    lisinopril-hydrochlorothiazide (ZESTORETIC) 20-25 MG tablet   Other Relevant Orders   CMP14+EGFR     Other   Lower urinary tract symptoms (LUTS)   Continue Flomax 0.4 mg daily UA negative for UTI minimal lateral lobe enlargement noted on last cytoscopy  Plans on following up with urologist      Relevant Orders   Ambulatory referral to Urology    Meds ordered this encounter  Medications   lisinopril-hydrochlorothiazide (ZESTORETIC) 20-25 MG tablet    Sig: Take 1 tablet by mouth daily.Please have patient contact office for an appointment.    Dispense:  90 tablet    Refill:  1    Please have patient contact office for an appointment.    Follow-up: Return in about 4 months (around 09/04/2023) for CPE.    Donell Beers, FNP

## 2023-05-07 NOTE — Assessment & Plan Note (Addendum)
 Continue Flomax 0.4 mg daily UA negative for UTI minimal lateral lobe enlargement noted on last cytoscopy  Plans on following up with urologist

## 2023-05-07 NOTE — Patient Instructions (Signed)
 1. Essential hypertension (Primary)  - CMP14+EGFR - lisinopril-hydrochlorothiazide (ZESTORETIC) 20-25 MG tablet; Take 1 tablet by mouth daily.Please have patient contact office for an appointment.  Dispense: 90 tablet; Refill: 1  2. Lower urinary tract symptoms (LUTS)  - Ambulatory referral to Urology    It is important that you exercise regularly at least 30 minutes 5 times a week as tolerated  Think about what you will eat, plan ahead. Choose " clean, green, fresh or frozen" over canned, processed or packaged foods which are more sugary, salty and fatty. 70 to 75% of food eaten should be vegetables and fruit. Three meals at set times with snacks allowed between meals, but they must be fruit or vegetables. Aim to eat over a 12 hour period , example 7 am to 7 pm, and STOP after  your last meal of the day. Drink water,generally about 64 ounces per day, no other drink is as healthy. Fruit juice is best enjoyed in a healthy way, by EATING the fruit.  Thanks for choosing [facility] we consider it a privelige to serve you.

## 2023-05-08 LAB — CMP14+EGFR
ALT: 18 [IU]/L (ref 0–44)
AST: 20 [IU]/L (ref 0–40)
Albumin: 4.2 g/dL (ref 3.8–4.9)
Alkaline Phosphatase: 81 [IU]/L (ref 44–121)
BUN/Creatinine Ratio: 10 (ref 9–20)
BUN: 11 mg/dL (ref 6–24)
Bilirubin Total: 0.8 mg/dL (ref 0.0–1.2)
CO2: 24 mmol/L (ref 20–29)
Calcium: 9.7 mg/dL (ref 8.7–10.2)
Chloride: 102 mmol/L (ref 96–106)
Creatinine, Ser: 1.15 mg/dL (ref 0.76–1.27)
Globulin, Total: 2.9 g/dL (ref 1.5–4.5)
Glucose: 110 mg/dL — ABNORMAL HIGH (ref 70–99)
Potassium: 3.7 mmol/L (ref 3.5–5.2)
Sodium: 139 mmol/L (ref 134–144)
Total Protein: 7.1 g/dL (ref 6.0–8.5)
eGFR: 76 mL/min/{1.73_m2} (ref >59)

## 2023-05-08 LAB — POCT URINE DIPSTICK
Bilirubin, UA: NEGATIVE
Blood, UA: NEGATIVE
Glucose, UA: NEGATIVE mg/dL
Ketones, POC UA: NEGATIVE mg/dL
Leukocytes, UA: NEGATIVE
Nitrite, UA: NEGATIVE
POC PROTEIN,UA: NEGATIVE
Spec Grav, UA: 1.02 (ref 1.010–1.025)
Urobilinogen, UA: 0.2 U/dL
pH, UA: 6.5 (ref 5.0–8.0)

## 2023-05-08 NOTE — Addendum Note (Signed)
 Addended by: Renelda Loma on: 05/08/2023 11:58 AM   Modules accepted: Orders

## 2023-05-22 ENCOUNTER — Other Ambulatory Visit: Payer: Self-pay

## 2023-05-23 ENCOUNTER — Other Ambulatory Visit: Payer: Self-pay

## 2023-06-12 ENCOUNTER — Ambulatory Visit: Payer: No Typology Code available for payment source | Admitting: Urology

## 2023-06-24 ENCOUNTER — Other Ambulatory Visit: Payer: Self-pay

## 2023-07-01 ENCOUNTER — Other Ambulatory Visit (HOSPITAL_COMMUNITY): Payer: Self-pay

## 2023-07-17 ENCOUNTER — Encounter: Payer: Self-pay | Admitting: Urology

## 2023-07-17 ENCOUNTER — Ambulatory Visit: Admitting: Urology

## 2023-07-17 VITALS — BP 130/80 | HR 74 | Ht 70.0 in | Wt 192.0 lb

## 2023-07-17 DIAGNOSIS — R399 Unspecified symptoms and signs involving the genitourinary system: Secondary | ICD-10-CM

## 2023-07-17 DIAGNOSIS — Z125 Encounter for screening for malignant neoplasm of prostate: Secondary | ICD-10-CM

## 2023-07-17 LAB — URINALYSIS, COMPLETE
Bilirubin, UA: NEGATIVE
Glucose, UA: NEGATIVE
Leukocytes,UA: NEGATIVE
Nitrite, UA: NEGATIVE
Protein,UA: NEGATIVE
RBC, UA: NEGATIVE
Specific Gravity, UA: 1.02 (ref 1.005–1.030)
Urobilinogen, Ur: 0.2 mg/dL (ref 0.2–1.0)
pH, UA: 5.5 (ref 5.0–7.5)

## 2023-07-17 LAB — MICROSCOPIC EXAMINATION: Bacteria, UA: NONE SEEN

## 2023-07-17 NOTE — Progress Notes (Signed)
 I, Maysun Jamey Mccallum, acting as a scribe for Geraline Knapp, MD., have documented all relevant documentation on the behalf of Geraline Knapp, MD, as directed by Geraline Knapp, MD while in the presence of Geraline Knapp, MD.  07/17/2023 5:17 PM   Kevin Little 06-06-1970 [id number]  Referring provider: Paseda, Folashade R, FNP [phone] [address] [place],  [place] [address]  Chief Complaint  Patient presents with   Other   Urologic history.: 1. BPH with LUTS Initially seen 2019 with urinary hesitancy, decreased force and caliber of urinary stream, frequency and urgency. Cystoscopy 03/2019 with minimal lateral lobe enlargement and no bladder abnormalities Marked improvement in his voiding symptoms on tamsulosin .  HPI: Kevin Little is a 53 y.o. male presents for a follow-up of lower urinary tract symptoms.   Last seen 10/11/2021 States when he misses tamsulosin  for 2 days he will have worsening urinary symptoms, which resolve when he restarts He is satisfied with his voiding symptoms when taking the medication. Last PSA July 2023 was 0.5   PMH: Past Medical History:  Diagnosis Date   Herpes    Herpes simplex    Hypertension    Sinus problem     Home Medications:  Allergies as of 07/17/2023       Reactions   Omeprazole Rash   Rabeprazole Sodium Rash   ( Aciphex)        Medication List        Accurate as of July 17, 2023  5:17 PM. If you have any questions, ask your nurse or doctor.          STOP taking these medications    methocarbamol  500 MG tablet Commonly known as: ROBAXIN  Stopped by: Geraline Knapp       TAKE these medications    aspirin  EC 81 MG tablet Take 1 tablet (81 mg total) by mouth daily.   lisinopril -hydrochlorothiazide  20-25 MG tablet Commonly known as: ZESTORETIC  Take 1 tablet by mouth daily.Please have patient contact office for an appointment.   tamsulosin  0.4 MG Caps capsule Commonly known as: FLOMAX  Take 1  capsule (0.4 mg total) by mouth daily.        Allergies:  Allergies  Allergen Reactions   Omeprazole Rash   Rabeprazole Sodium Rash    ( Aciphex)    Family History: Family History  Problem Relation Age of Onset   Hypertension Mother    Cancer Father        prostate    Social History:  reports that he has never smoked. He has never used smokeless tobacco. He reports that he does not drink alcohol and does not use drugs.   Physical Exam: BP 130/80   Pulse 74   Ht 5\' 10"  (1.778 m)   Wt 192 lb (87.1 kg)   BMI 27.55 kg/m   Constitutional:  Alert and oriented, No acute distress. HEENT: [place] AT Respiratory: Normal respiratory effort, no increased work of breathing. GU: Declined DRE Psychiatric: Normal mood and affect.  Laboratory: Urinalysis dipstick/microscopy negative  Assessment & Plan:    1. Lower urinary tract symptoms Recent cystoscopy 2021 with minimal lateral lobe enlargement.  Marked improvement in voiding symptoms with tamsulosin , likely due to its effect on reducing bladder-neck tone. Recommend he continue tamsulosin . and we discussed its mechanism of action.  2. Postate cancer screening Last PSA was July 2023, and he elected to have his PSA drawn today.  Continue annual follow-up.  I have  reviewed the above documentation for accuracy and completeness, and I agree with the above.   Geraline Knapp, MD  [facility] [address], [address] [place], [place] [address] [phone]

## 2023-07-18 LAB — PSA: Prostate Specific Ag, Serum: 0.5 ng/mL (ref 0.0–4.0)

## 2023-09-02 ENCOUNTER — Other Ambulatory Visit: Payer: Self-pay

## 2023-09-03 ENCOUNTER — Other Ambulatory Visit: Payer: Self-pay

## 2023-09-04 ENCOUNTER — Other Ambulatory Visit: Payer: Self-pay

## 2023-09-04 ENCOUNTER — Ambulatory Visit (INDEPENDENT_AMBULATORY_CARE_PROVIDER_SITE_OTHER): Payer: Self-pay | Admitting: Nurse Practitioner

## 2023-09-04 ENCOUNTER — Encounter: Payer: Self-pay | Admitting: Nurse Practitioner

## 2023-09-04 ENCOUNTER — Ambulatory Visit (HOSPITAL_COMMUNITY)
Admission: RE | Admit: 2023-09-04 | Discharge: 2023-09-04 | Disposition: A | Discharge status: Home / Self Care | Source: Ambulatory Visit | Attending: Nurse Practitioner | Admitting: Nurse Practitioner

## 2023-09-04 VITALS — BP 130/74 | HR 69 | Temp 97.5°F | Wt 206.0 lb

## 2023-09-04 DIAGNOSIS — R7303 Prediabetes: Secondary | ICD-10-CM | POA: Diagnosis not present

## 2023-09-04 DIAGNOSIS — Z13228 Encounter for screening for other metabolic disorders: Secondary | ICD-10-CM

## 2023-09-04 DIAGNOSIS — Z130 Encounter for screening for diseases of the blood and blood-forming organs and certain disorders involving the immune mechanism: Secondary | ICD-10-CM

## 2023-09-04 DIAGNOSIS — Z0000 Encounter for general adult medical examination without abnormal findings: Secondary | ICD-10-CM

## 2023-09-04 DIAGNOSIS — Z1211 Encounter for screening for malignant neoplasm of colon: Secondary | ICD-10-CM

## 2023-09-04 DIAGNOSIS — G8929 Other chronic pain: Secondary | ICD-10-CM | POA: Insufficient documentation

## 2023-09-04 DIAGNOSIS — Z23 Encounter for immunization: Secondary | ICD-10-CM | POA: Insufficient documentation

## 2023-09-04 DIAGNOSIS — I10 Essential (primary) hypertension: Secondary | ICD-10-CM

## 2023-09-04 DIAGNOSIS — Z1329 Encounter for screening for other suspected endocrine disorder: Secondary | ICD-10-CM

## 2023-09-04 DIAGNOSIS — M545 Low back pain, unspecified: Secondary | ICD-10-CM

## 2023-09-04 DIAGNOSIS — Z1321 Encounter for screening for nutritional disorder: Secondary | ICD-10-CM

## 2023-09-04 LAB — POCT GLYCOSYLATED HEMOGLOBIN (HGB A1C): Hemoglobin A1C: 6 % — AB (ref 4.0–5.6)

## 2023-09-04 MED ORDER — METHYLPREDNISOLONE NA SUC (PF) 40 MG IJ SOLR
40.0000 mg | Freq: Once | INTRAMUSCULAR | Status: AC
  Administered 2023-09-04: 40 mg via INTRAMUSCULAR

## 2023-09-04 MED ORDER — PREDNISONE 10 MG (21) PO TBPK
ORAL_TABLET | ORAL | 0 refills | Status: DC
  Filled 2023-09-04: qty 21, 6d supply, fill #0

## 2023-09-04 MED ORDER — LISINOPRIL-HYDROCHLOROTHIAZIDE 20-25 MG PO TABS
1.0000 | ORAL_TABLET | Freq: Every day | ORAL | 2 refills | Status: AC
  Filled 2023-09-04 – 2023-12-19 (×2): qty 90, 90d supply, fill #0
  Filled 2024-04-09: qty 90, 90d supply, fill #1

## 2023-09-04 NOTE — Assessment & Plan Note (Signed)
Patient educated on CDC recommendation for the vaccine. Verbal consent was obtained from the patient, vaccine administered by nurse, no sign of adverse reactions noted at this time. Patient education on arm soreness and use of tylenol o for this patient  was discussed. Patient educated on the signs and symptoms of adverse effect and advise to contact the office if they occur.

## 2023-09-04 NOTE — Progress Notes (Signed)
 2 weeks ago and to do following pain  Complete physical exam  Patient: Kevin Little   DOB: 1970/08/12   53 y.o. Male  MRN: [id number]  Subjective:    Chief Complaint  Patient presents with   Annual Exam    Not fasting   Back Pain    Lower back pain for two weeks no injury      Kevin Little is a 53 y.o. male   has a past medical history of Herpes, Herpes simplex, Hypertension, and Sinus problem. who presents today for a complete physical exam. Kevin Little reports consuming a general diet. The patient does not participate in regular exercise at present. Kevin Little generally feels well. Kevin Little reports sleeping well.  Chronic mid low back pain.  Patient complains of chronic mid low back pain that has become worse in the past 2 weeks, Kevin Little is back pain is worse when Kevin Little wakes up in the morning it eases off after walking, Kevin Little has been taking ibuprofen  as needed also applies heat.  His back pain is currently rated 8/10     Most recent fall risk assessment:    09/05/2021   11:54 AM  Fall Risk   Falls in the past year? 0  Number falls in past yr: 0  Injury with Fall? 0  Risk for fall due to : No Fall Risks  Follow up Falls evaluation completed      Data saved with a previous flowsheet row definition     Most recent depression screenings:    09/04/2023    2:14 PM 05/07/2023   10:19 AM  PHQ 2/9 Scores  PHQ - 2 Score 0 0  PHQ- 9 Score 0         Patient Care Team: Amorina Doerr R, FNP as PCP - General (Nurse Practitioner) Patient, No Pcp Per (General Practice)   Outpatient Medications Prior to Visit  Medication Sig   aspirin  EC 81 MG tablet Take 1 tablet (81 mg total) by mouth daily.   tamsulosin  (FLOMAX ) 0.4 MG CAPS capsule Take 1 capsule (0.4 mg total) by mouth daily.   [DISCONTINUED] lisinopril -hydrochlorothiazide  (ZESTORETIC ) 20-25 MG tablet Take 1 tablet by mouth daily.Please have patient contact office for an appointment.   No facility-administered medications prior to visit.     Review of Systems  Constitutional:  Negative for appetite change, chills, fatigue and fever.  HENT:  Negative for congestion, postnasal drip, rhinorrhea and sneezing.   Eyes:  Negative for pain, discharge and itching.  Respiratory:  Negative for cough, shortness of breath and wheezing.   Cardiovascular:  Negative for chest pain, palpitations and leg swelling.  Gastrointestinal:  Negative for abdominal pain, constipation, nausea and vomiting.  Endocrine: Negative for cold intolerance, heat intolerance and polydipsia.  Genitourinary:  Negative for difficulty urinating, dysuria, flank pain and frequency.  Musculoskeletal:  Positive for back pain. Negative for joint swelling and myalgias.  Skin:  Negative for color change, pallor, rash and wound.  Neurological:  Negative for dizziness, facial asymmetry, weakness, numbness and headaches.  Psychiatric/Behavioral:  Negative for behavioral problems, confusion, self-injury and suicidal ideas.        Objective:     BP 130/74   Pulse 69   Temp (!) 97.5 F (36.4 C)   Wt 206 lb (93.4 kg)   SpO2 99%   BMI 29.56 kg/m    Physical Exam Vitals and nursing note reviewed.  Constitutional:      General: Kevin Little is not in acute distress.  Appearance: Normal appearance. Kevin Little is obese. Kevin Little is not ill-appearing, toxic-appearing or diaphoretic.  HENT:     Right Ear: Tympanic membrane, ear canal and external ear normal. There is no impacted cerumen.     Left Ear: Tympanic membrane, ear canal and external ear normal. There is no impacted cerumen.     Nose: Nose normal. No congestion or rhinorrhea.     Mouth/Throat:     Mouth: Mucous membranes are moist.     Pharynx: Oropharynx is clear. No oropharyngeal exudate or posterior oropharyngeal erythema.   Eyes:     General: No scleral icterus.       Right eye: No discharge.        Left eye: No discharge.     Extraocular Movements: Extraocular movements intact.     Conjunctiva/sclera: Conjunctivae  normal.   Neck:     Vascular: No carotid bruit.   Cardiovascular:     Rate and Rhythm: Normal rate and regular rhythm.     Pulses: Normal pulses.     Heart sounds: Normal heart sounds. No murmur heard.    No friction rub. No gallop.  Pulmonary:     Effort: Pulmonary effort is normal. No respiratory distress.     Breath sounds: Normal breath sounds. No stridor. No wheezing, rhonchi or rales.  Chest:     Chest wall: No tenderness.  Abdominal:     General: Bowel sounds are normal. There is no distension.     Palpations: Abdomen is soft. There is no mass.     Tenderness: There is no abdominal tenderness. There is no right CVA tenderness, left CVA tenderness, guarding or rebound.     Hernia: No hernia is present.   Musculoskeletal:        General: Tenderness present. No swelling, deformity or signs of injury.     Cervical back: Normal range of motion and neck supple. No rigidity or tenderness.     Right lower leg: No edema.     Left lower leg: No edema.     Comments: Tenderness on palpation of mid low back area.  No redness or swelling noted  Lymphadenopathy:     Cervical: No cervical adenopathy.   Skin:    General: Skin is warm and dry.     Capillary Refill: Capillary refill takes less than 2 seconds.     Coloration: Skin is not jaundiced.     Findings: No bruising or lesion.   Neurological:     Mental Status: Kevin Little is alert and oriented to person, place, and time.     Cranial Nerves: No cranial nerve deficit.     Sensory: No sensory deficit.     Motor: No weakness.     Coordination: Coordination normal.     Gait: Gait normal.     Deep Tendon Reflexes: Reflexes normal.   Psychiatric:        Mood and Affect: Mood normal.        Behavior: Behavior normal.        Thought Content: Thought content normal.        Judgment: Judgment normal.     Results for orders placed or performed in visit on 09/04/23  POCT glycosylated hemoglobin (Hb A1C)  Result Value Ref Range    Hemoglobin A1C 6.0 (A) 4.0 - 5.6 %   HbA1c POC (<> result, manual entry)     HbA1c, POC (prediabetic range)     HbA1c, POC (controlled diabetic range)  Assessment & Plan:    Routine Health Maintenance and Physical Exam  Immunization History  Administered Date(s) Administered   Influenza,inj,Quad PF,6+ Mos 01/04/2014, 05/09/2015   Tdap 09/30/2013   Zoster Recombinant(Shingrix) 09/04/2023    Health Maintenance  Topic Date Due   Fecal DNA (Cologuard)  Never done   COVID-19 Vaccine (1 - 2024-25 season) Never done   DTaP/Tdap/Td (2 - Td or Tdap) 10/01/2023   INFLUENZA VACCINE  10/18/2023   Zoster Vaccines- Shingrix (2 of 2) 10/30/2023   Hepatitis C Screening  Completed   HIV Screening  Completed   HPV VACCINES  Aged Out   Meningococcal B Vaccine  Aged Out    Discussed health benefits of physical activity, and encouraged him to engage in regular exercise appropriate for his age and condition.  Problem List Items Addressed This Visit       Cardiovascular and Mediastinum   Essential hypertension   Controlled on lisinopril -hydrochlorothiazide  20-25 mg 1 tablet daily DASH diet and commitment to daily physical activity for a minimum of 30 minutes discussed and encouraged, as a part of hypertension management. The importance of attaining a healthy weight is also discussed.     09/04/2023    2:12 PM 07/17/2023    3:18 PM 05/07/2023   10:24 AM 05/07/2023   10:19 AM 10/30/2022   10:40 AM 05/30/2022    5:23 PM 05/30/2022    5:22 PM  BP/Weight  Systolic BP 130 130 132 140 133 151   Diastolic BP 74 80 74 70 77 97   Wt. (Lbs) 206 192  208 205  192  BMI 29.56 kg/m2 27.55 kg/m2  29.84 kg/m2 29.41 kg/m2  27.55 kg/m2           Relevant Medications   lisinopril -hydrochlorothiazide  (ZESTORETIC ) 20-25 MG tablet     Other   Need for Tdap vaccination   Patient educated on CDC recommendation for the vaccine. Verbal consent was obtained from the patient, vaccine administered by  nurse, no sign of adverse reactions noted at this time. Patient education on arm soreness and use of tylenol  o for this patient  was discussed. Patient educated on the signs and symptoms of adverse effect and advise to contact the office if they occur.       Midline low back pain without sciatica    - DG Lumbar Spine 2-3 Views; Future - predniSONE  (STERAPRED UNI-PAK 21 TAB) 10 MG (21) TBPK tablet; Take as instructed on the packaging.  Advised to take medication with food may start tomorrow, avoid taking ibuprofen  or Aleve  while taking medication - Ambulatory referral to Orthopedic Surgery - methylPREDNISolone sodium succinate (SOLU-MEDROL) 40 MG injection 40 mg Continue application of heat, stretching exercises encouraged      Relevant Medications   predniSONE  (STERAPRED UNI-PAK 21 TAB) 10 MG (21) TBPK tablet   Other Relevant Orders   DG Lumbar Spine 2-3 Views   Ambulatory referral to Orthopedic Surgery   Need for shingles vaccine   Patient educated on CDC recommendation for the vaccine. Verbal consent was obtained from the patient, vaccine administered by nurse, no sign of adverse reactions noted at this time. Patient education on arm soreness and use of tylenol  o for this patient  was discussed. Patient educated on the signs and symptoms of adverse effect and advise to contact the office if they occur.       Relevant Orders   Varicella-zoster vaccine IM (Completed)   Annual physical exam - Primary   Annual exam as  documented.  Counseling done include healthy lifestyle involving committing to 150 minutes of exercise per week, heart healthy diet, and attaining healthy weight. The importance of adequate sleep also discussed.  Regular use of seat belt and home safety were also discussed . Changes in health habits are decided on by patient with goals and time frames set for achieving them. Immunization and cancer screening  needs are specifically addressed at this visit.    Cologuard  ordered to screen for colon cancer .      Other Visit Diagnoses       Screening for colon cancer       Relevant Orders   Cologuard     Screening for endocrine, nutritional, metabolic and immunity disorder       Relevant Orders   CBC   CMP14+EGFR   Lipid panel   POCT glycosylated hemoglobin (Hb A1C) (Completed)      Return in about 4 months (around 01/04/2024) for FASTING LABS THIS WEEK.     Parris Signer R Cleon Thoma, FNP

## 2023-09-04 NOTE — Assessment & Plan Note (Signed)
 Lab Results  Component Value Date   HGBA1C 6.0 (A) 09/04/2023  Patient counseled on low-carb diet

## 2023-09-04 NOTE — Assessment & Plan Note (Signed)
-   DG Lumbar Spine 2-3 Views; Future - predniSONE  (STERAPRED UNI-PAK 21 TAB) 10 MG (21) TBPK tablet; Take as instructed on the packaging.  Advised to take medication with food may start tomorrow, avoid taking ibuprofen  or Aleve  while taking medication - Ambulatory referral to Orthopedic Surgery - methylPREDNISolone sodium succinate (SOLU-MEDROL) 40 MG injection 40 mg Continue application of heat, stretching exercises encouraged

## 2023-09-04 NOTE — Patient Instructions (Addendum)
 You were given Solu-Medrol 40 mg injection in the office today, start prednisone  tomorrow  Please take prednisone  as ordered take medication with food do not take ibuprofen  or Aleve  while taking prednisone .  Okay to take Tylenol  650 mg every 6 hours as needed Please get your x-ray done as [facility] Continue application of heat    1. Screening for colon cancer (Primary)  - Cologuard  2. Need for shingles vaccine   3. Chronic midline low back pain without sciatica  - DG Lumbar Spine 2-3 Views; Future - predniSONE  (STERAPRED UNI-PAK 21 TAB) 10 MG (21) TBPK tablet; Take as instructed on the packaging  Dispense: 1 each; Refill: 0 - Ambulatory referral to Orthopedic Surgery  4. Annual physical exam   5. Screening for endocrine, nutritional, metabolic and immunity disorder  - CBC; Future - CMP14+EGFR; Future - Lipid panel; Future  6. Essential hypertension  - lisinopril -hydrochlorothiazide  (ZESTORETIC ) 20-25 MG tablet; Take 1 tablet by mouth daily.Please have patient contact office for an appointment.  Dispense: 90 tablet; Refill: 2 into R at the check-in by anytime  It is important that you exercise regularly at least 30 minutes 5 times a week as tolerated  Think about what you will eat, plan ahead. Choose  clean, green, fresh or frozen over canned, processed or packaged foods which are more sugary, salty and fatty. 70 to 75% of food eaten should be vegetables and fruit. Three meals at set times with snacks allowed between meals, but they must be fruit or vegetables. Aim to eat over a 12 hour period , example 7 am to 7 pm, and STOP after  your last meal of the day. Drink water,generally about 64 ounces per day, no other drink is as healthy. Fruit juice is best enjoyed in a healthy way, by EATING the fruit.  Thanks for choosing [facility] we consider it a privelige to serve you.

## 2023-09-04 NOTE — Assessment & Plan Note (Signed)
 Controlled on lisinopril -hydrochlorothiazide  20-25 mg 1 tablet daily DASH diet and commitment to daily physical activity for a minimum of 30 minutes discussed and encouraged, as a part of hypertension management. The importance of attaining a healthy weight is also discussed.     09/04/2023    2:12 PM 07/17/2023    3:18 PM 05/07/2023   10:24 AM 05/07/2023   10:19 AM 10/30/2022   10:40 AM 05/30/2022    5:23 PM 05/30/2022    5:22 PM  BP/Weight  Systolic BP 130 130 132 140 133 151   Diastolic BP 74 80 74 70 77 97   Wt. (Lbs) 206 192  208 205  192  BMI 29.56 kg/m2 27.55 kg/m2  29.84 kg/m2 29.41 kg/m2  27.55 kg/m2

## 2023-09-04 NOTE — Assessment & Plan Note (Signed)
 Annual exam as documented.  Counseling done include healthy lifestyle involving committing to 150 minutes of exercise per week, heart healthy diet, and attaining healthy weight. The importance of adequate sleep also discussed.  Regular use of seat belt and home safety were also discussed . Changes in health habits are decided on by patient with goals and time frames set for achieving them. Immunization and cancer screening  needs are specifically addressed at this visit.    Cologuard ordered to screen for colon cancer .

## 2023-09-09 ENCOUNTER — Other Ambulatory Visit: Payer: Self-pay

## 2023-09-09 DIAGNOSIS — Z130 Encounter for screening for diseases of the blood and blood-forming organs and certain disorders involving the immune mechanism: Secondary | ICD-10-CM

## 2023-09-10 ENCOUNTER — Ambulatory Visit: Payer: Self-pay | Admitting: Nurse Practitioner

## 2023-09-10 LAB — CMP14+EGFR
ALT: 21 IU/L (ref 0–44)
AST: 20 IU/L (ref 0–40)
Albumin: 4.1 g/dL (ref 3.8–4.9)
Alkaline Phosphatase: 76 IU/L (ref 44–121)
BUN/Creatinine Ratio: 12 (ref 9–20)
BUN: 13 mg/dL (ref 6–24)
Bilirubin Total: 0.6 mg/dL (ref 0.0–1.2)
CO2: 23 mmol/L (ref 20–29)
Calcium: 9.1 mg/dL (ref 8.7–10.2)
Chloride: 103 mmol/L (ref 96–106)
Creatinine, Ser: 1.1 mg/dL (ref 0.76–1.27)
Globulin, Total: 2.7 g/dL (ref 1.5–4.5)
Glucose: 105 mg/dL — ABNORMAL HIGH (ref 70–99)
Potassium: 3.7 mmol/L (ref 3.5–5.2)
Sodium: 141 mmol/L (ref 134–144)
Total Protein: 6.8 g/dL (ref 6.0–8.5)
eGFR: 80 mL/min/{1.73_m2} (ref >59)

## 2023-09-10 LAB — LIPID PANEL
Chol/HDL Ratio: 4.1 ratio (ref 0.0–5.0)
Cholesterol, Total: 185 mg/dL (ref 100–199)
HDL: 45 mg/dL (ref >39)
LDL Chol Calc (NIH): 119 mg/dL — ABNORMAL HIGH (ref 0–99)
Triglycerides: 118 mg/dL (ref 0–149)
VLDL Cholesterol Cal: 21 mg/dL (ref 5–40)

## 2023-09-10 LAB — CBC
Hematocrit: 44.7 % (ref 37.5–51.0)
Hemoglobin: 14.4 g/dL (ref 13.0–17.7)
MCH: 29.5 pg (ref 26.6–33.0)
MCHC: 32.2 g/dL (ref 31.5–35.7)
MCV: 92 fL (ref 79–97)
Platelets: 175 10*3/uL (ref 150–450)
RBC: 4.88 x10E6/uL (ref 4.14–5.80)
RDW: 13.1 % (ref 11.6–15.4)
WBC: 4.2 10*3/uL (ref 3.4–10.8)

## 2023-09-30 ENCOUNTER — Other Ambulatory Visit: Payer: Self-pay | Admitting: Nurse Practitioner

## 2023-09-30 ENCOUNTER — Other Ambulatory Visit: Payer: Self-pay

## 2023-09-30 DIAGNOSIS — R300 Dysuria: Secondary | ICD-10-CM

## 2023-09-30 MED ORDER — TAMSULOSIN HCL 0.4 MG PO CAPS
0.4000 mg | ORAL_CAPSULE | Freq: Every day | ORAL | 1 refills | Status: AC
  Filled 2023-09-30: qty 90, 90d supply, fill #0
  Filled 2024-01-13: qty 90, 90d supply, fill #1

## 2023-10-03 ENCOUNTER — Other Ambulatory Visit: Payer: Self-pay

## 2023-10-04 ENCOUNTER — Other Ambulatory Visit: Payer: Self-pay

## 2023-12-19 ENCOUNTER — Other Ambulatory Visit: Payer: Self-pay

## 2023-12-20 ENCOUNTER — Other Ambulatory Visit: Payer: Self-pay

## 2024-01-06 ENCOUNTER — Ambulatory Visit: Payer: Self-pay | Admitting: Nurse Practitioner

## 2024-01-06 ENCOUNTER — Encounter: Payer: Self-pay | Admitting: Nurse Practitioner

## 2024-01-06 VITALS — BP 148/91 | HR 73 | Temp 97.2°F | Wt 203.0 lb

## 2024-01-06 DIAGNOSIS — I10 Essential (primary) hypertension: Secondary | ICD-10-CM

## 2024-01-06 DIAGNOSIS — R7303 Prediabetes: Secondary | ICD-10-CM | POA: Diagnosis not present

## 2024-01-06 DIAGNOSIS — E663 Overweight: Secondary | ICD-10-CM | POA: Diagnosis not present

## 2024-01-06 DIAGNOSIS — Z23 Encounter for immunization: Secondary | ICD-10-CM | POA: Insufficient documentation

## 2024-01-06 DIAGNOSIS — Z0000 Encounter for general adult medical examination without abnormal findings: Secondary | ICD-10-CM | POA: Insufficient documentation

## 2024-01-06 NOTE — Assessment & Plan Note (Addendum)
 Blood pressure is elevated because he has not taken his medications in a Continue lisinopril -hydrochlorothiazide  20-25 mg 1 tablet daily Encouraged to monitor blood pressure at home DASH diet and commitment to daily physical activity for a minimum of 30 minutes discussed and encouraged, as a part of hypertension management. The importance of attaining a healthy weight is also discussed.     01/06/2024   10:17 AM 01/06/2024   10:11 AM 09/04/2023    2:12 PM 07/17/2023    3:18 PM 05/07/2023   10:24 AM 05/07/2023   10:19 AM 10/30/2022   10:40 AM  BP/Weight  Systolic BP 148 147 130 130 132 140 133  Diastolic BP 91 91 74 80 74 70 77  Wt. (Lbs)  203 206 192  208 205  BMI  29.13 kg/m2 29.56 kg/m2 27.55 kg/m2  29.84 kg/m2 29.41 kg/m2

## 2024-01-06 NOTE — Assessment & Plan Note (Signed)
 General Health Maintenance Discussed importance of vaccinations and screenings. Addressed need for flu, shingles, pneumonia, and Tdap vaccines. Colon cancer screening kit received but not completed. - Administered flu vaccine and Tdap vaccine today. - Plan for shingles and pneumonia vaccines in subsequent visits. - Encouraged completion of colon cancer screening test. - Advised on potential vaccine side effects and management with Tylenol  if needed.

## 2024-01-06 NOTE — Patient Instructions (Signed)
 1. Essential hypertension (Primary)  - Basic Metabolic Panel  2. Overweight (BMI 25.0-29.9)   3. Need for Tdap vaccination   4. Need for influenza vaccination   It is important that you exercise regularly at least 30 minutes 5 times a week as tolerated  Think about what you will eat, plan ahead. Choose  clean, green, fresh or frozen over canned, processed or packaged foods which are more sugary, salty and fatty. 70 to 75% of food eaten should be vegetables and fruit. Three meals at set times with snacks allowed between meals, but they must be fruit or vegetables. Aim to eat over a 12 hour period , example 7 am to 7 pm, and STOP after  your last meal of the day. Drink water,generally about 64 ounces per day, no other drink is as healthy. Fruit juice is best enjoyed in a healthy way, by EATING the fruit.  Thanks for choosing [facility] we consider it a privelige to serve you.

## 2024-01-06 NOTE — Addendum Note (Signed)
 Addended by: VICTORY IHA on: 01/06/2024 11:05 AM   Modules accepted: Orders

## 2024-01-06 NOTE — Assessment & Plan Note (Signed)
 Lab Results  Component Value Date   HGBA1C 6.0 (A) 09/04/2023   Emphasized weight loss and dietary changes to reduce progression risk to type 2 diabetes. - Recommended reducing sugar and sweets intake. - Encouraged weight loss to lower diabetes risk.

## 2024-01-06 NOTE — Progress Notes (Signed)
 Established Patient Office Visit  Subjective:  Patient ID: Kevin Little, male    DOB: June 28, 1970  Age: 53 y.o. MRN: [id number]  CC:  Chief Complaint  Patient presents with   Hypertension    HPI Discussed the use of AI scribe software for clinical note transcription with the patient, who gave verbal consent to proceed.  History of Present Illness Kevin Little is a 52 year old male  has a past medical history of Herpes, Herpes simplex, Hypertension, and Sinus problem. who presents for a follow-up on his blood pressure management.  He did not take his blood pressure medication, lisinopril  25 mg, today before the visit, which he usually takes before leaving the house. He does not monitor his blood pressure at home but checks it at a pharmacy once a week.  He is currently taking lisinopril -hydrochlorothiazide  20-25 mg daily ,He also mentions taking baby aspirin  81 mg daily as a preventative measure for stroke and heart attack, although he stopped taking it about a month ago.  Regarding his diet, he avoids salty and fatty foods, does not eat much fried food, and prefers baked or grilled options. He limits bread intake. For physical activity, he has a bike and is considering signing up for a gym to exercise twice a week.  Family history includes stroke and heart attack. No chest pain, shortness of breath, or swelling in legs. His family is doing well, and he is grateful for their health.     Assessment & Plan     Past Medical History:  Diagnosis Date   Herpes    Herpes simplex    Hypertension    Sinus problem     History reviewed. No pertinent surgical history.  Family History  Problem Relation Age of Onset   Hypertension Mother    Cancer Father        prostate    Social History   Socioeconomic History   Marital status: Married    Spouse name: Not on file   Number of children: 2   Years of education: Not on file   Highest education level: Not on file   Occupational History   Not on file  Tobacco Use   Smoking status: Never   Smokeless tobacco: Never  Vaping Use   Vaping status: Never Used  Substance and Sexual Activity   Alcohol use: No   Drug use: No   Sexual activity: Yes    Birth control/protection: None  Other Topics Concern   Not on file  Social History Narrative   Lives with his wife    Social Drivers of Corporate investment banker Strain: Not on file  Food Insecurity: Not on file  Transportation Needs: Not on file  Physical Activity: Not on file  Stress: Not on file  Social Connections: Unknown (02/19/2022)   Received from [facility]   Social Network    Social Network: Not on file  Intimate Partner Violence: Unknown (02/19/2022)   Received from [facility]   HITS    Physically Hurt: Not on file    Insult or Talk Down To: Not on file    Threaten Physical Harm: Not on file    Scream or Curse: Not on file    Outpatient Medications Prior to Visit  Medication Sig Dispense Refill   lisinopril -hydrochlorothiazide  (ZESTORETIC ) 20-25 MG tablet Take 1 tablet by mouth daily.Please have patient contact office for an appointment. 90 tablet 2   tamsulosin  (FLOMAX ) 0.4 MG CAPS capsule Take 1  capsule (0.4 mg total) by mouth daily. 90 capsule 1   aspirin  EC 81 MG tablet Take 1 tablet (81 mg total) by mouth daily. (Patient not taking: Reported on 01/06/2024) 30 tablet 11   predniSONE  (STERAPRED UNI-PAK 21 TAB) 10 MG (21) TBPK tablet Take as instructed on the packaging. 21 tablet 0   No facility-administered medications prior to visit.    Allergies  Allergen Reactions   Omeprazole Rash   Rabeprazole Sodium Rash    ( Aciphex)    ROS Review of Systems  Constitutional:  Negative for appetite change, chills, fatigue and fever.  HENT:  Negative for congestion, postnasal drip, rhinorrhea and sneezing.   Respiratory:  Negative for cough, shortness of breath and wheezing.   Cardiovascular:  Negative for chest pain,  palpitations and leg swelling.  Gastrointestinal:  Negative for abdominal pain, constipation, nausea and vomiting.  Genitourinary:  Negative for difficulty urinating, dysuria, flank pain and frequency.  Musculoskeletal:  Negative for arthralgias, back pain, joint swelling and myalgias.  Skin:  Negative for color change, pallor, rash and wound.  Neurological:  Negative for dizziness, facial asymmetry, weakness, numbness and headaches.  Psychiatric/Behavioral:  Negative for behavioral problems, confusion, self-injury and suicidal ideas.       Objective:    Physical Exam Vitals and nursing note reviewed.  Constitutional:      General: He is not in acute distress.    Appearance: Normal appearance. He is not ill-appearing, toxic-appearing or diaphoretic.  Eyes:     General: No scleral icterus.       Right eye: No discharge.        Left eye: No discharge.     Extraocular Movements: Extraocular movements intact.     Conjunctiva/sclera: Conjunctivae normal.  Cardiovascular:     Rate and Rhythm: Normal rate and regular rhythm.     Pulses: Normal pulses.     Heart sounds: Normal heart sounds. No murmur heard.    No friction rub. No gallop.  Pulmonary:     Effort: Pulmonary effort is normal. No respiratory distress.     Breath sounds: Normal breath sounds. No stridor. No wheezing, rhonchi or rales.  Chest:     Chest wall: No tenderness.  Abdominal:     General: There is no distension.     Palpations: Abdomen is soft.     Tenderness: There is no abdominal tenderness. There is no right CVA tenderness, left CVA tenderness or guarding.  Musculoskeletal:        General: No swelling, tenderness, deformity or signs of injury.     Right lower leg: No edema.     Left lower leg: No edema.  Skin:    General: Skin is warm and dry.     Capillary Refill: Capillary refill takes less than 2 seconds.     Coloration: Skin is not jaundiced or pale.     Findings: No bruising, erythema or lesion.   Neurological:     Mental Status: He is alert and oriented to person, place, and time.     Motor: No weakness.     Coordination: Coordination normal.     Gait: Gait normal.  Psychiatric:        Mood and Affect: Mood normal.        Behavior: Behavior normal.        Thought Content: Thought content normal.        Judgment: Judgment normal.     BP (!) 148/91   Pulse 73  Temp (!) 97.2 F (36.2 C)   Wt 203 lb (92.1 kg)   SpO2 100%   BMI 29.13 kg/m  Wt Readings from Last 3 Encounters:  01/06/24 203 lb (92.1 kg)  09/04/23 206 lb (93.4 kg)  07/17/23 192 lb (87.1 kg)    Lab Results  Component Value Date   TSH 1.010 05/07/2018   Lab Results  Component Value Date   WBC 4.2 09/09/2023   HGB 14.4 09/09/2023   HCT 44.7 09/09/2023   MCV 92 09/09/2023   PLT 175 09/09/2023   Lab Results  Component Value Date   NA 141 09/09/2023   K 3.7 09/09/2023   CO2 23 09/09/2023   GLUCOSE 105 (H) 09/09/2023   BUN 13 09/09/2023   CREATININE 1.10 09/09/2023   BILITOT 0.6 09/09/2023   ALKPHOS 76 09/09/2023   AST 20 09/09/2023   ALT 21 09/09/2023   PROT 6.8 09/09/2023   ALBUMIN 4.1 09/09/2023   CALCIUM 9.1 09/09/2023   ANIONGAP 9 04/19/2019   EGFR 80 09/09/2023   Lab Results  Component Value Date   CHOL 185 09/09/2023   Lab Results  Component Value Date   HDL 45 09/09/2023   Lab Results  Component Value Date   LDLCALC 119 (H) 09/09/2023   Lab Results  Component Value Date   TRIG 118 09/09/2023   Lab Results  Component Value Date   CHOLHDL 4.1 09/09/2023   Lab Results  Component Value Date   HGBA1C 6.0 (A) 09/04/2023      Assessment & Plan:   Problem List Items Addressed This Visit       Cardiovascular and Mediastinum   Essential hypertension - Primary   Blood pressure is elevated because he has not taken his medications in a Continue lisinopril -hydrochlorothiazide  20-25 mg 1 tablet daily Encouraged to monitor blood pressure at home DASH diet and commitment  to daily physical activity for a minimum of 30 minutes discussed and encouraged, as a part of hypertension management. The importance of attaining a healthy weight is also discussed.     01/06/2024   10:17 AM 01/06/2024   10:11 AM 09/04/2023    2:12 PM 07/17/2023    3:18 PM 05/07/2023   10:24 AM 05/07/2023   10:19 AM 10/30/2022   10:40 AM  BP/Weight  Systolic BP 148 147 130 130 132 140 133  Diastolic BP 91 91 74 80 74 70 77  Wt. (Lbs)  203 206 192  208 205  BMI  29.13 kg/m2 29.56 kg/m2 27.55 kg/m2  29.84 kg/m2 29.41 kg/m2           Relevant Orders   Basic Metabolic Panel     Other   Need for Tdap vaccination   Prediabetes   Lab Results  Component Value Date   HGBA1C 6.0 (A) 09/04/2023   Emphasized weight loss and dietary changes to reduce progression risk to type 2 diabetes. - Recommended reducing sugar and sweets intake. - Encouraged weight loss to lower diabetes risk.       Overweight (BMI 25.0-29.9)   Wt Readings from Last 3 Encounters:  01/06/24 203 lb (92.1 kg)  09/04/23 206 lb (93.4 kg)  07/17/23 192 lb (87.1 kg)   Weight 203 lbs, BMI 29.13. Discussed weight management to prevent obesity-related conditions. - Encouraged regular exercise, aiming for moderate to vigorous activity five days a week. - Advised dietary modifications to include more vegetables and proteins, reduce carbohydrates. - Discussed potential benefits of joining a gym for structured  exercise.       Need for influenza vaccination   Health care maintenance   General Health Maintenance Discussed importance of vaccinations and screenings. Addressed need for flu, shingles, pneumonia, and Tdap vaccines. Colon cancer screening kit received but not completed. - Administered flu vaccine and Tdap vaccine today. - Plan for shingles and pneumonia vaccines in subsequent visits. - Encouraged completion of colon cancer screening test. - Advised on potential vaccine side effects and management with Tylenol   if needed.       No orders of the defined types were placed in this encounter.   Follow-up: Return in about 4 months (around 05/08/2024) for HTN.    Cindra Austad R Vaneza Pickart, FNP

## 2024-01-06 NOTE — Assessment & Plan Note (Addendum)
 Wt Readings from Last 3 Encounters:  01/06/24 203 lb (92.1 kg)  09/04/23 206 lb (93.4 kg)  07/17/23 192 lb (87.1 kg)   Weight 203 lbs, BMI 29.13. Discussed weight management to prevent obesity-related conditions. - Encouraged regular exercise, aiming for moderate to vigorous activity five days a week. - Advised dietary modifications to include more vegetables and proteins, reduce carbohydrates. - Discussed potential benefits of joining a gym for structured exercise.

## 2024-01-07 ENCOUNTER — Ambulatory Visit: Payer: Self-pay | Admitting: Nurse Practitioner

## 2024-01-07 LAB — BASIC METABOLIC PANEL WITH GFR
BUN/Creatinine Ratio: 8 — ABNORMAL LOW (ref 9–20)
BUN: 8 mg/dL (ref 6–24)
CO2: 27 mmol/L (ref 20–29)
Calcium: 9.5 mg/dL (ref 8.7–10.2)
Chloride: 103 mmol/L (ref 96–106)
Creatinine, Ser: 1.04 mg/dL (ref 0.76–1.27)
Glucose: 100 mg/dL — ABNORMAL HIGH (ref 70–99)
Potassium: 3.7 mmol/L (ref 3.5–5.2)
Sodium: 142 mmol/L (ref 134–144)
eGFR: 86 mL/min/1.73 (ref >59)

## 2024-01-07 NOTE — Telephone Encounter (Signed)
-----   Message from Folashade R Paseda sent at 01/07/2024  9:25 AM EDT ----- Your lab is stable please continue current medications ----- Message ----- From: Interface, [facility] Lab Results In Sent: 01/07/2024   5:38 AM EDT To: Folashade R Paseda, FNP

## 2024-01-07 NOTE — Telephone Encounter (Signed)
 Called patient to go over lab results. Patient did not answer. LVM

## 2024-01-13 ENCOUNTER — Other Ambulatory Visit: Payer: Self-pay

## 2024-04-09 ENCOUNTER — Other Ambulatory Visit: Payer: Self-pay

## 2024-05-11 ENCOUNTER — Ambulatory Visit: Payer: Self-pay | Admitting: Nurse Practitioner

## 2024-07-16 ENCOUNTER — Ambulatory Visit: Admitting: Urology
